# Patient Record
Sex: Female | Born: 1955 | Race: White | Hispanic: No | Marital: Single | Smoking: Never smoker
Health system: Southern US, Community
[De-identification: ages and names within clinical notes are randomized; demographics above are authoritative.]

## PROBLEM LIST (undated history)

## (undated) ENCOUNTER — Inpatient Hospital Stay: Admission: EM | Payer: Self-pay | Source: Home / Self Care

## (undated) DIAGNOSIS — M7070 Other bursitis of hip, unspecified hip: Secondary | ICD-10-CM

## (undated) DIAGNOSIS — R600 Localized edema: Secondary | ICD-10-CM

## (undated) DIAGNOSIS — D219 Benign neoplasm of connective and other soft tissue, unspecified: Secondary | ICD-10-CM

## (undated) DIAGNOSIS — E559 Vitamin D deficiency, unspecified: Secondary | ICD-10-CM

## (undated) DIAGNOSIS — E669 Obesity, unspecified: Secondary | ICD-10-CM

## (undated) DIAGNOSIS — F419 Anxiety disorder, unspecified: Secondary | ICD-10-CM

## (undated) DIAGNOSIS — M1990 Unspecified osteoarthritis, unspecified site: Secondary | ICD-10-CM

## (undated) DIAGNOSIS — K5790 Diverticulosis of intestine, part unspecified, without perforation or abscess without bleeding: Secondary | ICD-10-CM

## (undated) DIAGNOSIS — R609 Edema, unspecified: Secondary | ICD-10-CM

## (undated) DIAGNOSIS — G43909 Migraine, unspecified, not intractable, without status migrainosus: Secondary | ICD-10-CM

## (undated) DIAGNOSIS — K644 Residual hemorrhoidal skin tags: Secondary | ICD-10-CM

## (undated) DIAGNOSIS — S82899A Other fracture of unspecified lower leg, initial encounter for closed fracture: Secondary | ICD-10-CM

## (undated) DIAGNOSIS — R7303 Prediabetes: Secondary | ICD-10-CM

## (undated) DIAGNOSIS — M797 Fibromyalgia: Secondary | ICD-10-CM

## (undated) DIAGNOSIS — T7840XA Allergy, unspecified, initial encounter: Secondary | ICD-10-CM

## (undated) DIAGNOSIS — K219 Gastro-esophageal reflux disease without esophagitis: Secondary | ICD-10-CM

## (undated) HISTORY — DX: Prediabetes: R73.03

## (undated) HISTORY — PX: EYE SURGERY: SHX253

## (undated) HISTORY — DX: Diverticulosis of intestine, part unspecified, without perforation or abscess without bleeding: K57.90

## (undated) HISTORY — DX: Allergy, unspecified, initial encounter: T78.40XA

## (undated) HISTORY — PX: KNEE SURGERY: SHX244

## (undated) HISTORY — DX: Other fracture of unspecified lower leg, initial encounter for closed fracture: S82.899A

## (undated) HISTORY — DX: Vitamin D deficiency, unspecified: E55.9

## (undated) HISTORY — DX: Localized edema: R60.0

## (undated) HISTORY — PX: MANDIBLE SURGERY: SHX707

## (undated) HISTORY — DX: Other bursitis of hip, unspecified hip: M70.70

## (undated) HISTORY — DX: Benign neoplasm of connective and other soft tissue, unspecified: D21.9

## (undated) HISTORY — PX: BLEPHAROPLASTY: SUR158

## (undated) HISTORY — DX: Gastro-esophageal reflux disease without esophagitis: K21.9

## (undated) HISTORY — DX: Residual hemorrhoidal skin tags: K64.4

## (undated) HISTORY — DX: Edema, unspecified: R60.9

## (undated) HISTORY — PX: LIPOMA EXCISION: SHX5283

## (undated) HISTORY — DX: Anxiety disorder, unspecified: F41.9

## (undated) HISTORY — DX: Obesity, unspecified: E66.9

## (undated) HISTORY — DX: Unspecified osteoarthritis, unspecified site: M19.90

## (undated) HISTORY — PX: TONSILLECTOMY: SUR1361

## (undated) HISTORY — DX: Fibromyalgia: M79.7

## (undated) HISTORY — PX: MOUTH SURGERY: SHX715

---

## 1997-08-18 ENCOUNTER — Other Ambulatory Visit: Admission: RE | Admit: 1997-08-18 | Discharge: 1997-08-18 | Payer: Self-pay | Admitting: Obstetrics and Gynecology

## 1997-08-22 ENCOUNTER — Emergency Department (HOSPITAL_COMMUNITY): Admission: EM | Admit: 1997-08-22 | Discharge: 1997-08-22 | Payer: Self-pay | Admitting: Cardiology

## 1997-08-23 ENCOUNTER — Ambulatory Visit (HOSPITAL_COMMUNITY): Admission: RE | Admit: 1997-08-23 | Discharge: 1997-08-23 | Payer: Self-pay | Admitting: Internal Medicine

## 1998-09-09 ENCOUNTER — Other Ambulatory Visit: Admission: RE | Admit: 1998-09-09 | Discharge: 1998-09-09 | Payer: Self-pay | Admitting: Obstetrics and Gynecology

## 1999-11-09 ENCOUNTER — Other Ambulatory Visit: Admission: RE | Admit: 1999-11-09 | Discharge: 1999-11-09 | Payer: Self-pay | Admitting: Obstetrics and Gynecology

## 1999-11-30 ENCOUNTER — Encounter: Payer: Self-pay | Admitting: Obstetrics and Gynecology

## 1999-11-30 ENCOUNTER — Ambulatory Visit (HOSPITAL_COMMUNITY): Admission: RE | Admit: 1999-11-30 | Discharge: 1999-11-30 | Payer: Self-pay | Admitting: Obstetrics and Gynecology

## 2000-01-05 ENCOUNTER — Encounter: Admission: RE | Admit: 2000-01-05 | Discharge: 2000-01-05 | Payer: Self-pay | Admitting: Gastroenterology

## 2000-01-05 ENCOUNTER — Encounter: Payer: Self-pay | Admitting: Gastroenterology

## 2000-04-30 HISTORY — PX: ABDOMINAL HYSTERECTOMY: SHX81

## 2000-07-19 ENCOUNTER — Encounter: Admission: RE | Admit: 2000-07-19 | Discharge: 2000-07-19 | Payer: Self-pay | Admitting: Gastroenterology

## 2000-07-19 ENCOUNTER — Encounter: Payer: Self-pay | Admitting: Gastroenterology

## 2000-10-07 ENCOUNTER — Encounter: Payer: Self-pay | Admitting: Obstetrics and Gynecology

## 2000-10-14 ENCOUNTER — Encounter (INDEPENDENT_AMBULATORY_CARE_PROVIDER_SITE_OTHER): Payer: Self-pay

## 2000-10-14 ENCOUNTER — Inpatient Hospital Stay (HOSPITAL_COMMUNITY): Admission: RE | Admit: 2000-10-14 | Discharge: 2000-10-16 | Payer: Self-pay | Admitting: Obstetrics and Gynecology

## 2001-02-06 ENCOUNTER — Ambulatory Visit (HOSPITAL_COMMUNITY): Admission: RE | Admit: 2001-02-06 | Discharge: 2001-02-06 | Payer: Self-pay | Admitting: Vascular Surgery

## 2001-06-25 ENCOUNTER — Other Ambulatory Visit: Admission: RE | Admit: 2001-06-25 | Discharge: 2001-06-25 | Payer: Self-pay | Admitting: Obstetrics and Gynecology

## 2001-07-07 ENCOUNTER — Encounter: Admission: RE | Admit: 2001-07-07 | Discharge: 2001-07-07 | Payer: Self-pay | Admitting: Orthopaedic Surgery

## 2001-07-07 ENCOUNTER — Encounter: Payer: Self-pay | Admitting: Orthopaedic Surgery

## 2002-06-30 ENCOUNTER — Other Ambulatory Visit: Admission: RE | Admit: 2002-06-30 | Discharge: 2002-06-30 | Payer: Self-pay | Admitting: Obstetrics and Gynecology

## 2002-12-12 ENCOUNTER — Encounter: Payer: Self-pay | Admitting: Orthopaedic Surgery

## 2002-12-12 ENCOUNTER — Encounter: Admission: RE | Admit: 2002-12-12 | Discharge: 2002-12-12 | Payer: Self-pay | Admitting: Orthopaedic Surgery

## 2003-06-04 ENCOUNTER — Other Ambulatory Visit: Admission: RE | Admit: 2003-06-04 | Discharge: 2003-06-04 | Payer: Self-pay | Admitting: Obstetrics and Gynecology

## 2003-11-08 ENCOUNTER — Encounter: Admission: RE | Admit: 2003-11-08 | Discharge: 2003-11-08 | Payer: Self-pay | Admitting: Orthopaedic Surgery

## 2003-11-21 ENCOUNTER — Encounter: Admission: RE | Admit: 2003-11-21 | Discharge: 2003-11-21 | Payer: Self-pay | Admitting: Orthopaedic Surgery

## 2004-03-22 ENCOUNTER — Encounter: Admission: RE | Admit: 2004-03-22 | Discharge: 2004-03-22 | Payer: Self-pay | Admitting: Orthopedic Surgery

## 2004-06-15 ENCOUNTER — Other Ambulatory Visit: Admission: RE | Admit: 2004-06-15 | Discharge: 2004-06-15 | Payer: Self-pay | Admitting: Obstetrics and Gynecology

## 2004-07-07 ENCOUNTER — Ambulatory Visit: Payer: Self-pay | Admitting: Internal Medicine

## 2004-07-20 ENCOUNTER — Ambulatory Visit: Payer: Self-pay | Admitting: Family Medicine

## 2004-07-25 ENCOUNTER — Ambulatory Visit: Payer: Self-pay | Admitting: Internal Medicine

## 2004-07-26 ENCOUNTER — Ambulatory Visit: Payer: Self-pay | Admitting: Internal Medicine

## 2004-08-03 ENCOUNTER — Ambulatory Visit: Payer: Self-pay | Admitting: Internal Medicine

## 2004-08-25 ENCOUNTER — Emergency Department (HOSPITAL_COMMUNITY): Admission: EM | Admit: 2004-08-25 | Discharge: 2004-08-25 | Payer: Self-pay | Admitting: Emergency Medicine

## 2004-08-28 ENCOUNTER — Ambulatory Visit: Payer: Self-pay | Admitting: Internal Medicine

## 2004-09-20 ENCOUNTER — Ambulatory Visit: Payer: Self-pay | Admitting: Internal Medicine

## 2004-10-25 ENCOUNTER — Ambulatory Visit: Payer: Self-pay | Admitting: Internal Medicine

## 2005-04-20 ENCOUNTER — Encounter: Admission: RE | Admit: 2005-04-20 | Discharge: 2005-04-20 | Payer: Self-pay | Admitting: Orthopedic Surgery

## 2005-05-04 ENCOUNTER — Encounter: Admission: RE | Admit: 2005-05-04 | Discharge: 2005-05-04 | Payer: Self-pay | Admitting: Orthopedic Surgery

## 2005-05-23 ENCOUNTER — Encounter: Admission: RE | Admit: 2005-05-23 | Discharge: 2005-05-23 | Payer: Self-pay | Admitting: Orthopedic Surgery

## 2005-05-31 ENCOUNTER — Encounter: Admission: RE | Admit: 2005-05-31 | Discharge: 2005-05-31 | Payer: Self-pay | Admitting: Orthopedic Surgery

## 2005-08-22 ENCOUNTER — Other Ambulatory Visit: Admission: RE | Admit: 2005-08-22 | Discharge: 2005-08-22 | Payer: Self-pay | Admitting: Obstetrics and Gynecology

## 2005-12-25 ENCOUNTER — Ambulatory Visit: Payer: Self-pay | Admitting: Sports Medicine

## 2006-01-01 ENCOUNTER — Ambulatory Visit: Payer: Self-pay | Admitting: Sports Medicine

## 2006-02-12 ENCOUNTER — Ambulatory Visit: Payer: Self-pay | Admitting: Sports Medicine

## 2006-02-14 ENCOUNTER — Encounter: Admission: RE | Admit: 2006-02-14 | Discharge: 2006-02-14 | Payer: Self-pay | Admitting: Orthopedic Surgery

## 2006-03-26 ENCOUNTER — Ambulatory Visit: Payer: Self-pay | Admitting: Sports Medicine

## 2006-08-22 ENCOUNTER — Ambulatory Visit: Payer: Self-pay | Admitting: Internal Medicine

## 2006-09-02 ENCOUNTER — Ambulatory Visit: Payer: Self-pay | Admitting: Internal Medicine

## 2006-09-02 DIAGNOSIS — K5730 Diverticulosis of large intestine without perforation or abscess without bleeding: Secondary | ICD-10-CM | POA: Insufficient documentation

## 2006-09-02 HISTORY — PX: COLONOSCOPY: SHX5424

## 2006-09-26 ENCOUNTER — Other Ambulatory Visit: Admission: RE | Admit: 2006-09-26 | Discharge: 2006-09-26 | Payer: Self-pay | Admitting: Obstetrics and Gynecology

## 2007-09-29 ENCOUNTER — Other Ambulatory Visit: Admission: RE | Admit: 2007-09-29 | Discharge: 2007-09-29 | Payer: Self-pay | Admitting: Obstetrics and Gynecology

## 2008-02-25 ENCOUNTER — Ambulatory Visit: Payer: Self-pay | Admitting: Obstetrics and Gynecology

## 2008-03-29 ENCOUNTER — Telehealth: Payer: Self-pay | Admitting: Internal Medicine

## 2008-05-20 DIAGNOSIS — J45990 Exercise induced bronchospasm: Secondary | ICD-10-CM | POA: Insufficient documentation

## 2008-05-20 DIAGNOSIS — J309 Allergic rhinitis, unspecified: Secondary | ICD-10-CM | POA: Insufficient documentation

## 2008-05-25 ENCOUNTER — Encounter: Admission: RE | Admit: 2008-05-25 | Discharge: 2008-05-25 | Payer: Self-pay | Admitting: Orthopedic Surgery

## 2008-05-26 ENCOUNTER — Ambulatory Visit: Payer: Self-pay | Admitting: Internal Medicine

## 2008-05-26 DIAGNOSIS — K219 Gastro-esophageal reflux disease without esophagitis: Secondary | ICD-10-CM | POA: Insufficient documentation

## 2008-07-23 ENCOUNTER — Encounter: Admission: RE | Admit: 2008-07-23 | Discharge: 2008-07-23 | Payer: Self-pay | Admitting: Orthopedic Surgery

## 2008-10-21 ENCOUNTER — Other Ambulatory Visit: Admission: RE | Admit: 2008-10-21 | Discharge: 2008-10-21 | Payer: Self-pay | Admitting: Obstetrics and Gynecology

## 2008-10-21 ENCOUNTER — Encounter: Payer: Self-pay | Admitting: Obstetrics and Gynecology

## 2008-10-21 ENCOUNTER — Ambulatory Visit: Payer: Self-pay | Admitting: Obstetrics and Gynecology

## 2009-01-03 ENCOUNTER — Emergency Department (HOSPITAL_COMMUNITY): Admission: EM | Admit: 2009-01-03 | Discharge: 2009-01-03 | Payer: Self-pay | Admitting: Emergency Medicine

## 2009-06-10 ENCOUNTER — Ambulatory Visit: Payer: Self-pay | Admitting: Internal Medicine

## 2009-07-14 ENCOUNTER — Encounter: Admission: RE | Admit: 2009-07-14 | Discharge: 2009-07-14 | Payer: Self-pay | Admitting: Orthopedic Surgery

## 2009-08-01 ENCOUNTER — Encounter: Admission: RE | Admit: 2009-08-01 | Discharge: 2009-08-01 | Payer: Self-pay | Admitting: Orthopedic Surgery

## 2009-11-17 ENCOUNTER — Telehealth: Payer: Self-pay | Admitting: Family Medicine

## 2009-12-07 ENCOUNTER — Ambulatory Visit: Payer: Self-pay | Admitting: Obstetrics and Gynecology

## 2009-12-07 ENCOUNTER — Other Ambulatory Visit: Admission: RE | Admit: 2009-12-07 | Discharge: 2009-12-07 | Payer: Self-pay | Admitting: Obstetrics and Gynecology

## 2010-05-20 ENCOUNTER — Encounter: Payer: Self-pay | Admitting: Orthopedic Surgery

## 2010-05-30 NOTE — Assessment & Plan Note (Signed)
Summary: POSSBLE HEMORRHOIDS/FH   History of Present Illness Visit Type: new patient Primary GI MD: Stan Head MD [facility] Primary Provider: Oneta Rack Chief Complaint: Possible hemorrhoid History of Present Illness:   55 yo white woman s/p colonoscopy 2008. She called [place] about perianal irritation a few months ago and tried Monistat. Started out with irritation, soreness and itching. The Monistat helped but still has some problems.  Also has occasional smear of stool in underwear but no other bowel problems. Rare rectal bleeding with bright red on toilet paper.  Intermittent antacid use and nocturnal heartburn/reflux. A few to several times a month without dysphagia or other asociated problems. Does eat late at night and goes to bed after, frequently.  All other GI ROS negative.           Colonoscopy  Procedure date:  09/02/2006  Findings:      Results: Hemorrhoids. Small external/tag    Results: Diverticulosis.         Comments:      Repeat colonoscopy in 10 years.    Procedures Next Due Date:    Colonoscopy: 08/2016     Prior Medications Reviewed Using: Patient Recall  Updated Prior Medication List: MULTIVITAMINS  TABS (MULTIPLE VITAMIN) once daily FLEXERIL 10 MG TABS (CYCLOBENZAPRINE HCL) as needed IBUPROFEN 200 MG TABS (IBUPROFEN) as needed MOBIC 7.5 MG TABS (MELOXICAM) as needed  Current Allergies: No known allergies   Past Medical History:    Reviewed history from 05/20/2008 and no changes required:       Asthma, exercise-induced       Allergic Rhinitis  Past Surgical History:    Reviewed history from 05/20/2008 and no changes required:       Arthroscopic knee (L)  1994       Tonsillectomy 1964       Fatty tumor removed from labia-1979       Lower Jaw surgery 1990       Hysterectomy   Family History:    Family History of Diabetes:Father     Family History of Heart Disease: Father  Social History:    Occupation:Regulatory     Patient has  never smoked.     Alcohol Use - no    Daily Caffeine Use -3    Illicit Drug Use - no   Risk Factors:  Tobacco use:  never Drug use:  no Alcohol use:  no  Colonoscopy History:     Date of Last Colonoscopy:  09/02/2006    Results:  Results: Hemorrhoids. Small external/tag    Results: Diverticulosis.           Review of Systems       The patient complains of allergy/sinus, arthritis/joint pain, back pain, itching, and swelling of feet/legs.         All other ROS negative except as per HPI.    Vital Signs:  Patient Profile:   55 Years Old Female Height:     65 inches Weight:      195.25 pounds BMI:     32.61 Pulse rate:   60 / minute Pulse rhythm:   regular BP sitting:   120 / 80  (left arm)  Vitals Entered By: June McMurray CMA (May 26, 2008 11:04 AM)                  Physical Exam  General:     obese.   Eyes:     anicteric Abdomen:     obese, soft, NT, BS +,  no HSM Rectal:     female staff present, small perianal tag, ? slightly reduced resting tone, brown stool no mass perianal skin shows hypopigmentation with erythema and skin breaksown in gluteal crease Neurologic:     Alert and  oriented x4;      Impression & Recommendations:  Problem # 1:  PERIANAL DERMATOMYCOSIS (ICD-111.9) Assessment: New Likely due to sweat or inadequate drying after bathing. Local measures discussed and treat as outlined below.  Problem # 2:  EXTERNAL HEMORRHOIDS (ICD-455.3) Assessment: Comment Only Account for rare rectal bleeding. Colonoscopy 2008 otherwise ok. As per patient instructions.  Problem # 3:  GERD (ICD-530.81) Assessment: New Very mild, to try lifestyle changes.   Patient Instructions: 1)  Appy nystatin-triamcinolone ointment as prescribed. 2)  Use a hair dryer on low to dry anal area, use wet wipes after bowel movements as needed. 3)  Use a daily fiber supplement like metamucil or Benefiber, can also try FiberCon tabs. You choose. 4)  Rona Ravens  is a powder you can use to keep dry and prevent rash again after ointment used (available OTC). 5)  Read GERD handout. 6)  Please schedule a follow-up appointment as needed. 7)  Copy Sent To: Lucky Cowboy, MD and Edyth Gunnels, MD    Prescriptions: NYSTATIN-TRIAMCINOLONE 100000-0.1 UNIT/GM-% OINT (NYSTATIN-TRIAMCINOLONE) apply to affected area 2-3 times a day  #30 x 1   Entered by:   Francee Piccolo CMA   Authorized by:   Iva Boop MD   Signed by:   Francee Piccolo CMA on 05/26/2008   Method used:   [facility]ron[facility] to        [phone]* (retail)       [address] [place], [place]  [address]       Ph: [phone] or [phone]       Fax: [phone]   RxID:   [id number]

## 2010-05-30 NOTE — Procedures (Signed)
Summary: Colonoscopy   Colonoscopy  Procedure date:  09/02/2006  Findings:      Results: Hemorrhoids.     Results: Diverticulosis.       Location:  [facility].    Procedures Next Due Date:    Colonoscopy: 08/2016  Patient Name: Bridget Joyce, Bridget Joyce. MRN:  Procedure Procedures: Colonoscopy CPT: [phone].  Personnel: Endoscopist: Iva Boop, MD, [facility].  Exam Location: Exam performed in Outpatient Clinic. Outpatient  Patient Consent: Procedure, Alternatives, Risks and Benefits discussed, consent obtained, from patient. Consent was obtained by the RN.  Indications  Average Risk Screening Routine.  History  Current Medications: Patient is not currently taking Coumadin.  Allergies: No known allergies.  Pre-Exam Physical: Performed Sep 02, 2006. Cardio-pulmonary exam, Rectal exam, HEENT exam , Abdominal exam, Mental status exam WNL.  Comments: Pt. history reviewed/updated, physical exam performed prior to initiation of sedation? YES Exam Exam: Extent of exam reached: Cecum, extent intended: Cecum.  The cecum was identified by appendiceal orifice and IC valve. Patient position: on left side. Time to Cecum: 00:24:30. Time for Withdrawl: 00:06:40. Colon retroflexion performed. Images taken. ASA Classification: II. Tolerance: excellent.  Monitoring: Pulse and BP monitoring, Oximetry used. Supplemental O2 given.  Colon Prep Used MiraLax for colon prep. Prep results: excellent.  Sedation Meds: Patient assessed and found to be appropriate for moderate (conscious) sedation. Fentanyl 100 mcg. given IV. Versed 10 mg. given IV.  Findings - DIVERTICULOSIS: Descending Colon to Sigmoid Colon. ICD9: Diverticulosis, Colon: 562.10. Comments: moderate.  - NORMAL EXAM: Cecum to Splenic Flexure.  HEMORRHOIDS: External. Size: Small. ICD9: Hemorrhoids, External: 455. 3.   Assessment  Diagnoses: 562.10: Diverticulosis, Colon.  455.3: Hemorrhoids, External.    Comments: 1) NO POLYPS OR CANCER SEEN 2) LEFT-SIDED DIVERTICULOSIS 3) SMALL EXTERNAL HEMORRHOIDS AND ANAL TAG 4) EXCELLENT PREP Events  Unplanned Interventions: No intervention was required.  Plans Patient Education: Patient given standard instructions for: Diverticulosis. Hemorrhoids. Patient instructed to get routine colonoscopy every 10 years.  Disposition: After procedure patient sent to recovery. After recovery patient sent home.  Scheduling/Referral: Follow-Up prn.   CC:   Lucky Cowboy, MD   Edyth Gunnels, MD  This report was created from the original endoscopy report, which was reviewed and signed by the above listed endoscopist.

## 2010-05-30 NOTE — Assessment & Plan Note (Signed)
Summary: F/U--FEELS A KNOT IN ABD--CH.   History of Present Illness Visit Type: Follow-up Visit Primary GI MD: Stan Head MD [facility] Primary Provider: Lucky Cowboy, MD  Requesting Provider: n/a Chief Complaint: Knot in stomach  History of Present Illness:   55 yo woman with increased borborygmi in past month. One day she felt somethng itching and palpated rght side and ? if there was mre firmnes there  no other GI symptoms           Current Medications (verified): 1)  Multivitamins  Tabs (Multiple Vitamin) .... Once Daily 2)  Flexeril 10 Mg Tabs (Cyclobenzaprine Hcl) .... As Needed 3)  Ibuprofen 200 Mg Tabs (Ibuprofen) .... As Needed 4)  Mobic 7.5 Mg Tabs (Meloxicam) .... As Needed 5)  Nystatin-Triamcinolone 100000-0.1 Unit/gm-% Oint (Nystatin-Triamcinolone) .... Apply To Affected Area 2-3 Times A Day  Allergies (verified): No Known Drug Allergies  Past History:  Past Medical History: Reviewed history from 05/26/2008 and no changes required. Asthma, exercise-induced Allergic Rhinitis  Past Surgical History: Reviewed history from 05/26/2008 and no changes required. Arthroscopic knee (L)  1994 Tonsillectomy 1964 Fatty tumor removed from labia-1979 Lower Jaw surgery 1990 Hysterectomy  Family History: Family History of Diabetes:Father  Family History of Heart Disease: Father No FH of Colon Cancer:  Social History: Reviewed history from 05/26/2008 and no changes required. Occupation:Regulatory  Patient has never smoked.  Alcohol Use - no Daily Caffeine Use -3 Illicit Drug Use - no  Vital Signs:  Patient profile:   55 year old female Height:      65 inches Weight:      210 pounds BMI:     35.07 BSA:     2.02 Pulse rate:   64 / minute Pulse rhythm:   regular BP sitting:   120 / 72  (left arm) Cuff size:   regular  Vitals Entered By: Ok Anis CMA (June 10, 2009 3:52 PM)  Physical Exam  General:  obese.   Eyes:  anicteric Abdomen:  obese,  soft mildly tender in RUQ which increases with muscle tension no abnormality but likel some assymetry with abdominal fat greater on left than right no hernia   Impression & Recommendations:  Problem # 1:  ABDOMINAL TENDERNESS RIGHT UPPER QUADRANT (ICD-789.61) Assessment New I think she has abdominal wall/muscular pain should improve with time I found no subcutaneous lesions and I think what she palpated was due to assymetry of abdominal wall adipose tisue she will ovbserve for any changes and follow-up as needed  Problem # 2:  OBESITY (ICD-278.00) Assessment: New long discussion about this and need to reduce calories low fat diet given though not only way - she can pursue other diets (low catrb) dietitian referral offered but notaccepted she is 85# > than IBW  Patient Instructions: 1)  The abdominal wall strain should resolve over time.  If you notice a change or have a persistent problem then call our office. 2)  Obesity and Low Fat Diet handout given.  3)  Your BMI is 35.07.  We can make a referral to a Nutritionist if you like. 4)  The medication list was reviewed and reconciled.  All changed / newly prescribed medications were explained.  A complete medication list was provided to the patient / caregiver. cc Lucky Cowboy, MD

## 2010-05-30 NOTE — Progress Notes (Signed)
Summary: TRIAGE  Phone Note Call from Patient Call back at [phone]   Caller: Patient Call For: Steen Bisig Reason for Call: Talk to Nurse Summary of Call: PT STATES THAT SHE HAS BEEN HAVING A LOT OF RECTAL PROBLEMS , STATES THAT HER BOTTOM IS VERY RED AND SHE HAS A LOT OF ITCHING WANTS TO KNOW WHAT TO DO Initial call taken by: Tawni Levy,  March 29, 2008 9:32 AM  Follow-up for Phone Call        patient c/o red rash around her rectum and tailbone.  Very itchy.  She treated herself for a vaginal yeast infection but still has rash and itching.  Discussed rectal care, sitz bath two times a day, advised to get some Monistat creama and apply to rectal area and rash two-three times a day.  If no improvment then need to call us back.  Follow-up by: Darcey Nora RN,  March 29, 2008 9:45 AM  Additional Follow-up for Phone Call Additional follow up Details #1::        Her PCP should be able to handle but I am ok seeing her in future, if needed Additional Follow-up by: Iva Boop MD,  March 29, 2008 11:09 AM

## 2010-05-30 NOTE — Progress Notes (Signed)
Summary: Pt is req to re-est with Dr. Scotty Court or have a med called in  Phone Note Call from Patient Call back at [phone]   Caller: Patient Summary of Call: Pt called and said she was a pt of yours. Pts last ov was in 2006. Pt called to req ov asap or to get a med called in for severe headaches. Pt would like to re-establish with you.    [facility] Initial call taken by: Lucy Antigua,  November 17, 2009 3:55 PM  Follow-up for Phone Call        Dr Scotty Court is not taking new pts  but, maybe can get established with another PCP Follow-up by: Pura Spice, RN,  November 23, 2009 4:08 PM  Additional Follow-up for Phone Call Additional follow up Details #1::        Phone Call Completed Additional Follow-up by: Rudy Jew, RN,  November 23, 2009 4:11 PM

## 2010-09-15 NOTE — Discharge Summary (Signed)
[facility]  Patient:    Bridget Joyce, Bridget Joyce                  MRN: [id number] Adm. Date:  [id number] Disc. Date: [id number] Attending:  Ethelyne Mt                           Discharge Summary  HISTORY OF PRESENT ILLNESS:  The patient is a 56 year old nulligravida who was admitted to the hospital with symptomatic fibroids for surgery.  HOSPITAL COURSE:  On the day of admission, she was taken to the operating room and a total abdominal hysterectomy was performed.  Postoperatively, she had some nausea which responded to a change in her pain medicine and continuing her IVs.  By the second postoperative day, she was voiding well, passing gas, and her nausea was gone.  DISCHARGE MEDICATIONS:  She was discharged on Darvocet-N 100 for pain relief.  DIET:  Regular.  ACTIVITY:  Ambulatory.  FOLLOW-UP:  She is to return to the office in two days for staple removal.  LABORATORY DATA:  The final pathology report revealed fibroids.  The pathology report is actually not in the chart at the time of dictation, but by memory, she had fibroids on the pathology report.  CONDITION ON DISCHARGE:  Improved.  DISCHARGE DIAGNOSIS:  Symptomatic fibroids.  OPERATIONS:  Total abdominal hysterectomy. DD:  10/23/00 TD:  10/23/00 Job: [id number]

## 2010-09-15 NOTE — Op Note (Signed)
[facility]  Patient:    Bridget Joyce, Bridget Joyce                       MRN: [id number] Proc. Date: 10/14/00 Attending:  Rande Brunt. Eda Paschal, M.D.                           Operative Report  PREOPERATIVE DIAGNOSIS:       Symptomatic fibroids.  POSTOPERATIVE DIAGNOSIS:      Symptomatic fibroids.  OPERATION:                    Total abdominal hysterectomy.  SURGEON:                      Daniel L. Eda Paschal, M.D.  FIRST ASSISTANT:              Katy Fitch, M.D.  ANESTHESIA:                   General endotracheal.  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  Ovaries, fallopian tubes, pelvic peritoneum were free of disease. Rest of the exploration of the abdomen was normal.  DESCRIPTION OF PROCEDURE:     After adequate general endotracheal anesthesia, the patient was placed in supine position and prepped and draped in the usual sterile manner.  Foley catheter was inserted in the patients bladder. Pfannenstiel incision was made, the fascia was opened transversely, the peritoneum was entered vertically.  Subcutaneous bleeders were clamped and bovied as encountered.  When the peritoneal cavity was opened, the above findings were noted.  The round ligaments were clamped, cut and suture-ligated and the vesicouterine fold of peritoneum and posterior peritoneum were sharply incised.  Uteroovarian ligaments and fallopian tubes were clamped, cut and doubly suture-ligated with #1 chromic catgut.  The bladder flap was advanced. The uterine arteries were clamped, cut and doubly suture-ligated with #1 chromic catgut.  A supracervical hysterectomy was done so that we could get deeper into the pelvis with ease.  After this was done, the cervix was removed by clamping, cutting and suture-ligating the parametrium with #1 chromic catgut.  Cervicovaginal  junction was identified and with sharp dissection, the vaginal cuff was sutured with a #1 chromic catgut, incorporating uterosacrals for parametrium for good vault support.  The cervix as well as the uterus were sent to pathology for tissue diagnosis.  The cuff was closed with figure-of-eights of 0 Vicryl.  Copious irrigation was done with sterile saline.  Peritoneum was closed with 0 Vicryl in a running suture. Subcutaneous and subfascial tissue was copiously irrigated.  The fascia was closed with two running 0 Vicryls.  The skin was closed with staples. Estimated blood loss for the entire procedure was 250 cc with none replaced. Patient tolerated the procedure well and left the operating room in satisfactory condition, draining clear urine from her Foley catheter. DD:  10/14/00 TD:  10/14/00 Job: [id number]

## 2010-11-21 ENCOUNTER — Other Ambulatory Visit: Payer: Self-pay | Admitting: Neurological Surgery

## 2010-11-21 DIAGNOSIS — M25551 Pain in right hip: Secondary | ICD-10-CM

## 2010-11-21 DIAGNOSIS — M545 Low back pain, unspecified: Secondary | ICD-10-CM

## 2010-11-27 ENCOUNTER — Ambulatory Visit
Admission: RE | Admit: 2010-11-27 | Discharge: 2010-11-27 | Disposition: A | Discharge status: Home / Self Care | Payer: BC Managed Care – PPO | Source: Ambulatory Visit | Attending: Neurological Surgery | Admitting: Neurological Surgery

## 2010-11-27 DIAGNOSIS — M25551 Pain in right hip: Secondary | ICD-10-CM

## 2010-11-27 DIAGNOSIS — M545 Low back pain, unspecified: Secondary | ICD-10-CM

## 2010-11-27 DIAGNOSIS — M25552 Pain in left hip: Secondary | ICD-10-CM

## 2010-12-04 ENCOUNTER — Encounter: Payer: Self-pay | Admitting: Gynecology

## 2010-12-12 ENCOUNTER — Other Ambulatory Visit (HOSPITAL_COMMUNITY)
Admission: RE | Admit: 2010-12-12 | Discharge: 2010-12-12 | Disposition: A | Discharge status: Home / Self Care | Payer: BC Managed Care – PPO | Source: Ambulatory Visit | Attending: Obstetrics and Gynecology | Admitting: Obstetrics and Gynecology

## 2010-12-12 ENCOUNTER — Ambulatory Visit (INDEPENDENT_AMBULATORY_CARE_PROVIDER_SITE_OTHER): Payer: BC Managed Care – PPO | Admitting: Obstetrics and Gynecology

## 2010-12-12 ENCOUNTER — Encounter: Payer: Self-pay | Admitting: Obstetrics and Gynecology

## 2010-12-12 VITALS — BP 120/78 | Ht 65.0 in | Wt 205.0 lb

## 2010-12-12 DIAGNOSIS — Z780 Asymptomatic menopausal state: Secondary | ICD-10-CM

## 2010-12-12 DIAGNOSIS — Z01419 Encounter for gynecological examination (general) (routine) without abnormal findings: Secondary | ICD-10-CM

## 2010-12-12 DIAGNOSIS — R82998 Other abnormal findings in urine: Secondary | ICD-10-CM

## 2010-12-12 DIAGNOSIS — N951 Menopausal and female climacteric states: Secondary | ICD-10-CM

## 2010-12-12 LAB — HM PAP SMEAR: HM Pap smear: NORMAL

## 2010-12-12 MED ORDER — ESTRADIOL 0.05 MG/24HR TD PTTW: 1.0000 | MEDICATED_PATCH | TRANSDERMAL | Status: DC

## 2010-12-12 NOTE — Progress Notes (Signed)
Patient came to see me today for her annual GYN exam. She has a feeling of warmth all thetime and wonders if she should go on estrogen. She is up-to-date on mammograms but is due for bone density.  HEENT: Within normal limits. Neck: No masses. Supraclavicular lymph nodes: Not enlarged. Breasts: Examined in both sitting and lying position. Symmetrical without skin changes or masses. Abdomen: Soft no masses guarding or rebound. No hernias. Pelvic: External within normal limits. BUS within normal limits. Vaginal examination shows good estrogen effect, no cystocele enterocele or rectocele. Cervix and uterus absent. Adnexa within normal limits. Rectovaginal confirmatory. Extremities within normal limits.   Assessment: Menopausal symptoms  Plan: Had a very long discussion about pros and cons of estrogen especially since she doesn't have a uterus. Started her on Vivelle-Dot patches 0.05 mg BIW. Bone density ordered.

## 2010-12-12 NOTE — Progress Notes (Signed)
Addended byCammie Mcgee T on: 12/12/2010 10:27 AM   Modules accepted: Orders

## 2010-12-21 ENCOUNTER — Ambulatory Visit (INDEPENDENT_AMBULATORY_CARE_PROVIDER_SITE_OTHER): Payer: BC Managed Care – PPO | Admitting: Gynecology

## 2010-12-21 DIAGNOSIS — Z13820 Encounter for screening for osteoporosis: Secondary | ICD-10-CM

## 2010-12-21 DIAGNOSIS — Z780 Asymptomatic menopausal state: Secondary | ICD-10-CM

## 2010-12-21 LAB — HM DEXA SCAN: HM Dexa Scan: NORMAL

## 2011-03-28 ENCOUNTER — Ambulatory Visit (INDEPENDENT_AMBULATORY_CARE_PROVIDER_SITE_OTHER): Payer: BC Managed Care – PPO | Admitting: Internal Medicine

## 2011-03-28 ENCOUNTER — Encounter: Payer: Self-pay | Admitting: Internal Medicine

## 2011-03-28 ENCOUNTER — Other Ambulatory Visit (INDEPENDENT_AMBULATORY_CARE_PROVIDER_SITE_OTHER): Payer: BC Managed Care – PPO

## 2011-03-28 VITALS — BP 128/76 | HR 64 | Ht 65.0 in | Wt 211.0 lb

## 2011-03-28 DIAGNOSIS — R1032 Left lower quadrant pain: Secondary | ICD-10-CM

## 2011-03-28 DIAGNOSIS — R10814 Left lower quadrant abdominal tenderness: Secondary | ICD-10-CM

## 2011-03-28 LAB — CBC WITH DIFFERENTIAL/PLATELET
Basophils Absolute: 0 10*3/uL (ref 0.0–0.1)
Basophils Relative: 0.8 % (ref 0.0–3.0)
Eosinophils Absolute: 0.1 10*3/uL (ref 0.0–0.7)
Eosinophils Relative: 1.8 % (ref 0.0–5.0)
HCT: 37.1 % (ref 36.0–46.0)
Hemoglobin: 12.4 g/dL (ref 12.0–15.0)
Lymphocytes Relative: 25.4 % (ref 12.0–46.0)
Lymphs Abs: 1.6 10*3/uL (ref 0.7–4.0)
MCHC: 33.3 g/dL (ref 30.0–36.0)
MCV: 93.7 fl (ref 78.0–100.0)
Monocytes Absolute: 0.3 10*3/uL (ref 0.1–1.0)
Monocytes Relative: 5.5 % (ref 3.0–12.0)
Neutro Abs: 4.2 10*3/uL (ref 1.4–7.7)
Neutrophils Relative %: 66.5 % (ref 43.0–77.0)
Platelets: 356 10*3/uL (ref 150.0–400.0)
RBC: 3.96 Mil/uL (ref 3.87–5.11)
RDW: 13.6 % (ref 11.5–14.6)
WBC: 6.4 10*3/uL (ref 4.5–10.5)

## 2011-03-28 LAB — BASIC METABOLIC PANEL
BUN: 14 mg/dL (ref 6–23)
CO2: 29 mEq/L (ref 19–32)
Calcium: 9.2 mg/dL (ref 8.4–10.5)
Chloride: 104 mEq/L (ref 96–112)
Creatinine, Ser: 0.8 mg/dL (ref 0.4–1.2)
GFR: 82.8 mL/min (ref >60.00)
Glucose, Bld: 92 mg/dL (ref 70–99)
Potassium: 4 mEq/L (ref 3.5–5.1)
Sodium: 141 mEq/L (ref 135–145)

## 2011-03-28 NOTE — Progress Notes (Signed)
Subjective:    Patient ID: Bridget Joyce, female    DOB: 08-08-1955, 55 y.o.   MRN: [id number]  HPI This 55 year old woman presents with a three-week history of intermittent sharp and sometimes severe left lower quadrant pain. She's also had some change in bowel habits with smaller stools. Has not been any fever, nausea or vomiting. She denies any genitourinary symptoms. She does have chronic buttock and lower extremity pain for 2 years but that does not seem to be related to this problem. She has not had any rectal bleeding. She says the pain actually has gotten better but it does persist. It may be worse with bending over as well but not with movement described.  No Known Allergies Outpatient Prescriptions Prior to Visit  Medication Sig Dispense Refill  . albuterol (PROVENTIL) (2.5 MG/3ML) 0.083% nebulizer solution Take 2.5 mg by nebulization every 6 (six) hours as needed.        . BUMETANIDE PO Take by mouth as needed.       . Calcium Carbonate-Vitamin D (CALCIUM + D PO) Take by mouth 2 (two) times daily.        . magnesium 30 MG tablet Take by mouth.        . Multiple Vitamin (MULTIVITAMIN) capsule Take 1 capsule by mouth daily.        Marland Kitchen VITAMIN D, CHOLECALCIFEROL, PO Take 2,000 Units by mouth.       . CYCLOBENZAPRINE HCL PO Take by mouth as needed.        Marland Kitchen estradiol (VIVELLE) 0.05 MG/24HR Place 1 patch (0.05 mg total) onto the skin 2 (two) times a week.  8 patch  12  . MELOXICAM PO Take by mouth as needed.        . Montelukast Sodium (SINGULAIR PO) Take by mouth as needed.         Past Medical History  Diagnosis Date  . Fibroid   . Anxiety   . Diverticulosis   . External hemorrhoids   . GERD (gastroesophageal reflux disease)   . Asthma    Past Surgical History  Procedure Date  . Tonsillectomy   . Knee surgery     Left  . Lipoma excision     Labial  . Abdominal hysterectomy 2002  . Mandible surgery   . Mouth surgery   . Colonoscopy 09/02/2006    external  hemorrhoids,diverticulosis   History   Social History  . Marital Status: Single     N/A     N/A  .     Social History Main Topics  . Smoking status: Never Smoker   . Smokeless tobacco: Never Used  . Alcohol Use: No  . Drug Use: No  . Sexually Active: No     Family History  Problem Relation Age of Onset  . Hypertension Father   . Heart disease Father   . Diabetes Father   . Heart failure Father   . Uterine cancer Sister   . Diabetes Sister   . Colon cancer Neg Hx          Review of Systems As above    Objective:   Physical Exam General: obese, nad Eyes: anicteric Lungs: clear Heart: S1S2 no rubs, murmurs or gallops Abdomen: soft , BS+, obese. She is tender in the left lower quadrant with some mild involuntary guarding at times. Remainder the abdomen is soft and nontender without organomegaly or mass. Ext: no edema There is no CVA tenderness  Assessment & Plan:  A three-week history of left lower quadrant tenderness and pain. She does have diverticulosis as seen on prior colonoscopy in 2008. There's some associated bowel habit changes well. She probably has diverticulitis. We discussed the options of treating her for that with antibiotics versus diagnostic workup for CT scanning. We have decided to pursue CT of the abdomen and pelvis with IV contrast, a CBC and basic metabolic panel will be done as well. I will call the results and plans to her.

## 2011-03-28 NOTE — Patient Instructions (Addendum)
Please go to the basement upon leaving today to have your labs done.    You have been scheduled for a CT scan of the abdomen and pelvis at [facility] ([address]---this is in the same building as Architectural technologist).   You are scheduled on 03/30/11 at 11:30 am. You should arrive 15 minutes prior to your appointment time for registration. Please follow the written instructions below on the day of your exam:  WARNING: IF YOU ARE ALLERGIC TO IODINE/X-RAY DYE, PLEASE NOTIFY RADIOLOGY IMMEDIATELY AT [phone]! YOU WILL BE GIVEN A 13 HOUR PREMEDICATION PREP.  1) Do not eat or drink anything after 7:30 am (4 hours prior to your test) 2) You have been given 2 bottles of oral contrast to drink. The solution may taste better if refrigerated, but do NOT add ice or any other liquid to this solution. Shake well before drinking.    Drink 1 bottle of contrast @ 9:30 am (2 hours prior to your exam)  Drink 1 bottle of contrast @ 10:30 am (1 hour prior to your exam)  You may take any medications as prescribed with a small amount of water except for the following: Metformin, Glucophage, Glucovance, Avandamet, Riomet, Fortamet, Actoplus Met, Janumet, Glumetza or Metaglip. The above medications must be held the day of the exam AND 48 hours after the exam.  The purpose of you drinking the oral contrast is to aid in the visualization of your intestinal tract. The contrast solution may cause some diarrhea. Before your exam is started, you will be given a small amount of fluid to drink. Depending on your individual set of symptoms, you may also receive an intravenous injection of x-ray contrast/dye. Plan on being at [facility] for 30 minutes or long, depending on the type of exam you are having performed.  If you have any questions regarding your exam or if you need to reschedule, you may call the CT department at [phone] between the hours of 8:00 am and 5:00 pm,  Monday-Friday.  ________________________________________________________________________

## 2011-03-29 ENCOUNTER — Telehealth: Payer: Self-pay | Admitting: Gastroenterology

## 2011-03-29 NOTE — Telephone Encounter (Signed)
Received a call from [facility] at [facility] stating that patient called and cancelled her CT appointment for tomorrow 03/30/11.

## 2011-03-30 ENCOUNTER — Other Ambulatory Visit: Payer: BC Managed Care – PPO

## 2011-07-16 ENCOUNTER — Telehealth: Payer: Self-pay | Admitting: Internal Medicine

## 2011-07-16 NOTE — Telephone Encounter (Signed)
Left message for patient to call back  

## 2011-07-16 NOTE — Telephone Encounter (Signed)
Patient canceled the CT scan that was scheduled for her last year and wants to discuss the possibility of setting up a CT scan.  Patient is scheduled for an office visit on 08/10/11 to discuss

## 2011-08-10 ENCOUNTER — Encounter: Payer: Self-pay | Admitting: Internal Medicine

## 2011-08-10 ENCOUNTER — Ambulatory Visit (INDEPENDENT_AMBULATORY_CARE_PROVIDER_SITE_OTHER): Payer: BC Managed Care – PPO | Admitting: Internal Medicine

## 2011-08-10 VITALS — BP 124/80 | HR 74 | Ht 65.0 in | Wt 216.0 lb

## 2011-08-10 DIAGNOSIS — R198 Other specified symptoms and signs involving the digestive system and abdomen: Secondary | ICD-10-CM

## 2011-08-10 DIAGNOSIS — R1032 Left lower quadrant pain: Secondary | ICD-10-CM

## 2011-08-10 DIAGNOSIS — R194 Change in bowel habit: Secondary | ICD-10-CM

## 2011-08-10 MED ORDER — PEG-KCL-NACL-NASULF-NA ASC-C 100 G PO SOLR: 1.0000 | Freq: Once | ORAL | Status: DC

## 2011-08-10 NOTE — Progress Notes (Signed)
  Subjective:    Patient ID: Bridget Joyce, female    DOB: Jan 23, 1956, 56 y.o.   MRN: [id number]  HPI This middle aged white woman presents for followup. I had seen her in November, 2012 she had left lower quadrant pain and tenderness and change in bowel habits. There was concern for possible diverticulitis. She was constipated. She had a CT scan scheduled but decided not to do that due to radiation exposure. She had problems for several weeks and eventually spontaneously resolved. Then again in February she had a shorter episode per week or 2 with milder pain and mild or constipation and change in bowels. She relates that her sister has had a colonoscopy and then subsequent 6 inch resection of her colon the following day because of "something in the corner". She does not think she has had colon cancer but really does not know exactly what that diagnosis was, this took place in [place].  She actually feels okay today but remains concerned about these episodes of change in bowels and pain. Review his colonoscopy 2008 and showed diverticulosis and external hemorrhoids.  Medications, allergies, past medical history, past surgical history, family history and social history are reviewed and updated in the EMR.   Review of Systems As above    Objective:   Physical Exam General:  NAD - obese Eyes:   anicteric Lungs:  clear Heart:  S1S2 no rubs, murmurs or gallops Abdomen:  soft and nontender, BS+     Data Reviewed:   Lab Results  Component Value Date   WBC 6.4 03/28/2011   HGB 12.4 03/28/2011   HCT 37.1 03/28/2011   MCV 93.7 03/28/2011   PLT 356.0 03/28/2011     Chemistry      Component Value Date/Time   NA 141 03/28/2011 1437   K 4.0 03/28/2011 1437   CL 104 03/28/2011 1437   CO2 29 03/28/2011 1437   BUN 14 03/28/2011 1437   CREATININE 0.8 03/28/2011 1437      Component Value Date/Time   CALCIUM 9.2 03/28/2011 1437             Assessment & Plan:   1. Change in  bowel habits   2. LLQ pain    She's had at least 2 episodes, one fairly prolonged period she decided against a CT scan due to radiation exposure. She could be having recurrent diverticulitis. Colorectal neoplasia remains in the differential diagnosis though less likely. We discussed things, and I decided to proceed with a diagnostic colonoscopy.The risks and benefits as well as alternatives of endoscopic procedure(s) have been discussed and reviewed. All questions answered. The patient agrees to proceed.   She will also ask her sister what diagnosis she has has a could have barium upon the frequency of routine colonoscopy.  Patient also describes rare episodes of nocturnal reflux, it is usually when she eats late at night. I advised dietary modification and she can use an antacid or Pepcid complete on an as-needed basis when she eats close to bedtime.  Cc: Nadean Corwin, MD

## 2011-08-10 NOTE — Patient Instructions (Signed)
You have been scheduled for a colonoscopy. Please follow written instructions given to you at your visit today.  Please pick up your prep kit at the pharmacy within the next 1-3 days.  

## 2011-09-18 ENCOUNTER — Telehealth: Payer: Self-pay | Admitting: Internal Medicine

## 2011-09-18 NOTE — Telephone Encounter (Signed)
No charge. 

## 2011-09-20 ENCOUNTER — Other Ambulatory Visit: Payer: BC Managed Care – PPO | Admitting: Internal Medicine

## 2011-12-07 ENCOUNTER — Ambulatory Visit (AMBULATORY_SURGERY_CENTER): Payer: BC Managed Care – PPO | Admitting: *Deleted

## 2011-12-07 VITALS — Ht 65.0 in | Wt 214.0 lb

## 2011-12-07 DIAGNOSIS — Z1211 Encounter for screening for malignant neoplasm of colon: Secondary | ICD-10-CM

## 2011-12-07 MED ORDER — MOVIPREP 100 G PO SOLR: ORAL | Status: DC

## 2011-12-10 ENCOUNTER — Encounter: Payer: Self-pay | Admitting: Internal Medicine

## 2011-12-20 ENCOUNTER — Encounter: Payer: BC Managed Care – PPO | Admitting: Internal Medicine

## 2011-12-27 ENCOUNTER — Encounter: Payer: BC Managed Care – PPO | Admitting: Obstetrics and Gynecology

## 2012-01-03 ENCOUNTER — Ambulatory Visit (INDEPENDENT_AMBULATORY_CARE_PROVIDER_SITE_OTHER): Payer: BC Managed Care – PPO | Admitting: Obstetrics and Gynecology

## 2012-01-03 ENCOUNTER — Encounter: Payer: Self-pay | Admitting: Obstetrics and Gynecology

## 2012-01-03 ENCOUNTER — Telehealth: Payer: Self-pay | Admitting: *Deleted

## 2012-01-03 VITALS — BP 122/78 | Ht 65.0 in | Wt 214.0 lb

## 2012-01-03 DIAGNOSIS — R102 Pelvic and perineal pain: Secondary | ICD-10-CM

## 2012-01-03 DIAGNOSIS — Z01419 Encounter for gynecological examination (general) (routine) without abnormal findings: Secondary | ICD-10-CM

## 2012-01-03 MED ORDER — ESTRADIOL 0.025 MG/24HR TD PTTW: 1.0000 | MEDICATED_PATCH | TRANSDERMAL | Status: DC

## 2012-01-03 NOTE — Telephone Encounter (Signed)
Pt was seen today for annual and forgot to mention some wrist line discomfort off & on since Jan. She was not having discomfort today, but said that it feels sore at time on the left side mainly in abdominal area. Pt would like to know if an ultrasound would be appropriate? Please advise

## 2012-01-03 NOTE — Addendum Note (Signed)
Addended by: Aura Camps on: 01/03/2012 03:34 PM   Modules accepted: Orders

## 2012-01-03 NOTE — Telephone Encounter (Signed)
ORDER PLACED

## 2012-01-03 NOTE — Telephone Encounter (Signed)
Yes waist, order will be placed for this.

## 2012-01-03 NOTE — Telephone Encounter (Signed)
Did you mean waist? Assuming U. Did ultrasound is appropriate.

## 2012-01-03 NOTE — Progress Notes (Signed)
The patient came to see me today for her annual GYN exam. She continues to have hot flashes but they are getting better. She has thought about estrogen replacement for a long time and we've discussed in the past. Her major motivation was like a focus and improvement in  skin quality rather the hot flashes. She is not vaginally dry. She is not sexually active. We discussed skin products and places she can go for them previously. See last year's office visit. She is having no vaginal bleeding. She is having no pelvic pain. She is due for her yearly mammogram. She has had 2 normal bone densities. The last one was 2012. She is status post TAH done in 2002 for large fibroids. She has always had normal Pap smears. Her last Pap smear was 2012. She is having no urinary issues.  HEENT: Within normal limits. Kennon Portela present. Neck: No masses. Supraclavicular lymph nodes: Not enlarged. Breasts: Examined in both sitting and lying position. Symmetrical without skin changes or masses. Abdomen: Soft no masses guarding or rebound. No hernias. Pelvic: External within normal limits. BUS within normal limits. Vaginal examination shows good estrogen effect, no cystocele enterocele or rectocele. Cervix and uterus absent. Adnexa within normal limits. Rectovaginal confirmatory. Extremities within normal limits.  Assessment: Menopausal symptoms including lack of focus.  Plan: Schedule yearly mammogram. We had a long discussion about HRT. Prescription for Vivelle dot patch 0.025 mg twice a week written. She will decide.The new Pap smear guidelines were discussed with the patient. No Pap done.

## 2012-01-03 NOTE — Patient Instructions (Signed)
Schedule yearly mammogram

## 2012-01-04 LAB — URINALYSIS W MICROSCOPIC + REFLEX CULTURE
Bacteria, UA: NONE SEEN
Bilirubin Urine: NEGATIVE
Casts: NONE SEEN
Crystals: NONE SEEN
Glucose, UA: NEGATIVE mg/dL
Hgb urine dipstick: NEGATIVE
Ketones, ur: NEGATIVE mg/dL
Leukocytes, UA: NEGATIVE
Nitrite: NEGATIVE
Protein, ur: NEGATIVE mg/dL
Specific Gravity, Urine: 1.023 (ref 1.005–1.030)
Squamous Epithelial / LPF: NONE SEEN
Urobilinogen, UA: 0.2 mg/dL (ref 0.0–1.0)
pH: 7 (ref 5.0–8.0)

## 2012-01-07 ENCOUNTER — Encounter (HOSPITAL_COMMUNITY): Payer: Self-pay

## 2012-01-07 ENCOUNTER — Telehealth: Payer: Self-pay | Admitting: Internal Medicine

## 2012-01-07 ENCOUNTER — Emergency Department (HOSPITAL_COMMUNITY)
Admission: EM | Admit: 2012-01-07 | Discharge: 2012-01-08 | Disposition: A | Discharge status: Home / Self Care | Payer: BC Managed Care – PPO | Attending: Emergency Medicine | Admitting: Emergency Medicine

## 2012-01-07 DIAGNOSIS — K219 Gastro-esophageal reflux disease without esophagitis: Secondary | ICD-10-CM | POA: Insufficient documentation

## 2012-01-07 DIAGNOSIS — M722 Plantar fascial fibromatosis: Secondary | ICD-10-CM

## 2012-01-07 DIAGNOSIS — M898X9 Other specified disorders of bone, unspecified site: Secondary | ICD-10-CM | POA: Insufficient documentation

## 2012-01-07 DIAGNOSIS — J45909 Unspecified asthma, uncomplicated: Secondary | ICD-10-CM | POA: Insufficient documentation

## 2012-01-07 DIAGNOSIS — E669 Obesity, unspecified: Secondary | ICD-10-CM | POA: Insufficient documentation

## 2012-01-07 DIAGNOSIS — M779 Enthesopathy, unspecified: Secondary | ICD-10-CM

## 2012-01-07 NOTE — ED Notes (Signed)
Pt states she was walking at home this evening and developed a pain in her left heel-states she was barefoot when it happened and states she does not know if she stepped on something-states an area of redness on her heel-denies any injury.

## 2012-01-07 NOTE — Telephone Encounter (Signed)
Spoke with Dr. Leone Payor, who states Bridget Joyce should prep.    Spoke with Bridget Joyce and instructed her to follow instructions and understanding voiced.

## 2012-01-07 NOTE — Telephone Encounter (Signed)
Pt called back.  She states she ate [employer] for lunch.  She hasn't mixed her prep yet.  I told her to stay on clear liquids until I heard from Dr. Leone Payor and understanding voiced.-

## 2012-01-08 ENCOUNTER — Emergency Department (HOSPITAL_COMMUNITY): Payer: BC Managed Care – PPO

## 2012-01-08 ENCOUNTER — Encounter: Payer: Self-pay | Admitting: Internal Medicine

## 2012-01-08 ENCOUNTER — Ambulatory Visit (AMBULATORY_SURGERY_CENTER): Payer: BC Managed Care – PPO | Admitting: Internal Medicine

## 2012-01-08 VITALS — BP 116/76 | HR 72 | Temp 97.4°F | Resp 29 | Ht 65.0 in | Wt 214.0 lb

## 2012-01-08 DIAGNOSIS — Z1211 Encounter for screening for malignant neoplasm of colon: Secondary | ICD-10-CM

## 2012-01-08 DIAGNOSIS — D126 Benign neoplasm of colon, unspecified: Secondary | ICD-10-CM

## 2012-01-08 DIAGNOSIS — K5730 Diverticulosis of large intestine without perforation or abscess without bleeding: Secondary | ICD-10-CM

## 2012-01-08 LAB — HM COLONOSCOPY

## 2012-01-08 MED ORDER — SODIUM CHLORIDE 0.9 % IV SOLN: 500.0000 mL | INTRAVENOUS | Status: DC

## 2012-01-08 NOTE — ED Notes (Signed)
Pt reported walking out barefoot 1930 yesterday to get stuff out of the car and had left foot pain side of the bottom of the heel. Lateral bottom side of left heel reddened.

## 2012-01-08 NOTE — Op Note (Signed)
[facility] [address] [place], [address]   COLONOSCOPY PROCEDURE REPORT  PATIENT: Bridget Joyce, Bridget Joyce  MR#: [id number] BIRTHDATE: 06-27-1955 , 56  yrs. old GENDER: Female ENDOSCOPIST: Iva Boop, MD, [facility] REFERRED [id number] Oneta Rack, M.D. PROCEDURE DATE:  01/08/2012 PROCEDURE:   Colonoscopy with snare polypectomy ASA CLASS:   Class II INDICATIONS:average risk screening. MEDICATIONS: Propofol (Diprivan) 240 mg IV, MAC sedation, administered by CRNA, and These medications were titrated to patient response per physician's verbal order  DESCRIPTION OF PROCEDURE:   After the risks benefits and alternatives of the procedure were thoroughly explained, informed consent was obtained.  A digital rectal exam revealed no abnormalities of the rectum.   The LB CF-[id number]  endoscope was introduced through the anus and advanced to the cecum, which was identified by both the appendix and ileocecal valve. No adverse events experienced.   The quality of the prep was excellent, using MoviPrep  The instrument was then slowly withdrawn as the colon was fully examined.      COLON FINDINGS: A smooth flat polyp was found at the cecum.  A polypectomy was performed with a cold snare.  The resection was complete and the polyp tissue was completely retrieved.   There was severe diverticulosis noted in the sigmoid colon with associated muscular hypertrophy and colonic narrowing.   The colon mucosa was otherwise normal.  Retroflexed views revealed no abnormalities. The time to cecum=3 minutes 21 seconds.  Withdrawal time=10 minutes 39 seconds.  The scope was withdrawn and the procedure completed. COMPLICATIONS: There were no complications.  ENDOSCOPIC IMPRESSION: 1.   Flat polyp was found at the cecum; polypectomy was performed with a cold snare 2.   There was severe diverticulosis noted in the sigmoid colon 3.   The colon mucosa was otherwise  normal  RECOMMENDATIONS: Timing of repeat colonoscopy will be determined by pathology findings.   eSigned:  Iva Boop, MD, [facility] 01/08/2012 3:00 PM  cc: Lucky Cowboy, MD and The Patient

## 2012-01-08 NOTE — ED Notes (Signed)
Pt reports left lat foot pain that began this evening - pt unsure if it is a spider bite or foreign body, pain was acute onset - mild redness noted to left lat heal, no induration noted or gross foreign body.

## 2012-01-08 NOTE — ED Notes (Signed)
Pt very upset w/ wait time in department, apologized multiple times to pt and attempted to explain to pt why other pts may have been taken back before her - pt continues to be upset and stating she no longer wishes to wait and has colonoscopy scheduled for 9am. Tiffany, PA made aware of pt and unable to see pt at this time. Pt left w/o being seen by provider. In no acute distress - A&Ox4 - ambulating independently w/o assistance.

## 2012-01-08 NOTE — ED Provider Notes (Signed)
History     CSN: [id number]  Arrival date & time 01/07/12  2108   First MD Initiated Contact with Patient 01/08/12 0133      Chief Complaint  Patient presents with  . Foot Pain     HPI Pt states she was walking at home this evening and developed a pain in her left heel-states she was barefoot when it happened and states she does not know if she stepped on something-states an area of redness on her heel-denies any injury  Past Medical History  Diagnosis Date  . Fibroid   . Anxiety   . Diverticulosis   . External hemorrhoids   . GERD (gastroesophageal reflux disease)   . Asthma   . Obesity   . Peripheral edema   . Allergy     rag weed, pollen,dust  . Bursitis of hip     Past Surgical History  Procedure Date  . Tonsillectomy   . Knee surgery     Left  . Lipoma excision     Labial  . Abdominal hysterectomy 2002  . Mandible surgery   . Mouth surgery   . Colonoscopy 09/02/2006    external hemorrhoids,diverticulosis    Family History  Problem Relation Age of Onset  . Hypertension Father   . Heart disease Father   . Diabetes Father   . Heart failure Father   . Uterine cancer Sister   . Diabetes Sister   . Colon cancer Neg Hx   . Esophageal cancer Neg Hx   . Stomach cancer Neg Hx   . Rectal cancer Neg Hx     History  Substance Use Topics  . Smoking status: Never Smoker   . Smokeless tobacco: Never Used  . Alcohol Use: No    OB History    Grav Para Term Preterm Abortions TAB SAB Ect Mult Living   0               Review of Systems  All other systems reviewed and are negative.    Allergies  Review of patient's allergies indicates no known allergies.  Home Medications   Current Outpatient Rx  Name Route Sig Dispense Refill  . ALBUTEROL SULFATE (2.5 MG/3ML) 0.083% IN NEBU Nebulization Take 2.5 mg by nebulization every 6 (six) hours as needed.      . ASPIRIN EC 81 MG PO TBEC Oral Take 81 mg by mouth daily.    . BUMETANIDE PO Oral Take 1 tablet by  mouth daily as needed. For fluid.    . BUSPIRONE HCL 10 MG PO TABS Oral Take 10 mg by mouth daily.     Marland Kitchen CALCIUM CITRATE + PO Oral Take 1 tablet by mouth daily.     Marland Kitchen VITAMIN D 2000 UNITS PO CAPS Oral Take 1 capsule by mouth daily.    Marland Kitchen CITALOPRAM HYDROBROMIDE 40 MG PO TABS Oral Take 40 mg by mouth daily.     Marland Kitchen MAGNESIUM 30 MG PO TABS Oral Take 30 mg by mouth daily.     . ADULT MULTIVITAMIN W/MINERALS CH Oral Take 1 tablet by mouth daily.    Marland Kitchen ESTRADIOL 0.025 MG/24HR TD PTTW Transdermal Place 1 patch onto the skin 2 (two) times a week. 8 patch 12    BP 120/71  Pulse 74  Temp 98 F (36.7 C) (Oral)  Resp 20  Ht 5\' 5"  (1.651 m)  Wt 207 lb (93.895 kg)  BMI 34.45 kg/m2  SpO2 96%  Physical Exam  Nursing note  and vitals reviewed. Constitutional: She is oriented to person, place, and time. She appears well-developed. No distress.  HENT:  Head: Normocephalic and atraumatic.  Eyes: Pupils are equal, round, and reactive to light.  Neck: Normal range of motion.  Cardiovascular: Normal rate and intact distal pulses.   Pulmonary/Chest: No respiratory distress.  Abdominal: Normal appearance. She exhibits no distension.  Musculoskeletal: Normal range of motion.       Feet:  Neurological: She is alert and oriented to person, place, and time. No cranial nerve deficit.  Skin: Skin is warm and dry. No rash noted.  Psychiatric: She has a normal mood and affect. Her behavior is normal.    ED Course  Procedures (including critical care time)  Labs Reviewed - No data to display Dg Foot Complete Left  01/08/2012  *RADIOLOGY REPORT*  Clinical Data: Sudden onset lateral left heel pain.  Recently walking bare foot.  LEFT FOOT - COMPLETE 3+ VIEW  Comparison: None.  Findings: Moderate inferior and posterior calcaneal spur formation. No fracture, dislocation or radiopaque foreign body.  Mild dorsal navicular spur formation and mild to moderate lateral spur formation on both sides of the first MTP joint.   IMPRESSION:  1.  Degenerative changes, as described above, including calcaneal spurs. 2.  No acute abnormality.   Original Report Authenticated By: Darrol Angel, M.D.      1. Bone spur   2. Plantar fasciitis       MDM         Nelia Shi, MD 01/08/12 1550

## 2012-01-08 NOTE — Patient Instructions (Addendum)
There was one possible polyp removed. I will let you know the pathology results.  You also have diverticulosis - I suspect that is what caused your abdominal pain.  Please read the handouts provided.  Thank you for choosing me and [facility].  Iva Boop, MD, FACG  YOU HAD AN ENDOSCOPIC PROCEDURE TODAY AT THE [facility]: Refer to the procedure report that was given to you for any specific questions about what was found during the examination.  If the procedure report does not answer your questions, please call your gastroenterologist to clarify.  If you requested that your care partner not be given the details of your procedure findings, then the procedure report has been included in a sealed envelope for you to review at your convenience later.  YOU SHOULD EXPECT: Some feelings of bloating in the abdomen. Passage of more gas than usual.  Walking can help get rid of the air that was put into your GI tract during the procedure and reduce the bloating. If you had a lower endoscopy (such as a colonoscopy or flexible sigmoidoscopy) you may notice spotting of blood in your stool or on the toilet paper. If you underwent a bowel prep for your procedure, then you may not have a normal bowel movement for a few days.  DIET: Your first meal following the procedure should be a light meal and then it is ok to progress to your normal diet.  A half-sandwich or bowl of soup is an example of a good first meal.  Heavy or fried foods are harder to digest and may make you feel nauseous or bloated.  Likewise meals heavy in dairy and vegetables can cause extra gas to form and this can also increase the bloating.  Drink plenty of fluids but you should avoid alcoholic beverages for 24 hours.  ACTIVITY: Your care partner should take you home directly after the procedure.  You should plan to take it easy, moving slowly for the rest of the day.  You can resume normal activity the day after the  procedure however you should NOT DRIVE or use heavy machinery for 24 hours (because of the sedation medicines used during the test).    SYMPTOMS TO REPORT IMMEDIATELY: A gastroenterologist can be reached at any hour.  During normal business hours, 8:30 AM to 5:00 PM Monday through Friday, call [phone].  After hours and on weekends, please call the GI answering service at [phone] who will take a message and have the physician on call contact you.   Following lower endoscopy (colonoscopy or flexible sigmoidoscopy):  Excessive amounts of blood in the stool  Significant tenderness or worsening of abdominal pains  Swelling of the abdomen that is new, acute  Fever of 100F or higher  Following upper endoscopy (EGD)  Vomiting of blood or coffee ground material  New chest pain or pain under the shoulder blades  Painful or persistently difficult swallowing  New shortness of breath  Fever of 100F or higher  Black, tarry-looking stools  FOLLOW UP: If any biopsies were taken you will be contacted by phone or by letter within the next 1-3 weeks.  Call your gastroenterologist if you have not heard about the biopsies in 3 weeks.  Our staff will call the home number listed on your records the next business day following your procedure to check on you and address any questions or concerns that you may have at that time regarding the information given to you following  your procedure. This is a courtesy call and so if there is no answer at the home number and we have not heard from you through the emergency physician on call, we will assume that you have returned to your regular daily activities without incident.  SIGNATURES/CONFIDENTIALITY: You and/or your care partner have signed paperwork which will be entered into your electronic medical record.  These signatures attest to the fact that that the information above on your After Visit Summary has been reviewed and is understood.  Full  responsibility of the confidentiality of this discharge information lies with you and/or your care-partner.    Polyp, diverticulosis, high fiber diet information given.

## 2012-01-08 NOTE — Progress Notes (Signed)
Patient did not experience any of the following events: a burn prior to discharge; a fall within the facility; wrong site/side/patient/procedure/implant event; or a hospital transfer or hospital admission upon discharge from the facility. (G8907) Patient did not have preoperative order for IV antibiotic SSI prophylaxis. (G8918)  

## 2012-01-08 NOTE — Progress Notes (Signed)
The pt tolerated the colonoscopy very well. Maw   

## 2012-01-09 ENCOUNTER — Telehealth: Payer: Self-pay | Admitting: *Deleted

## 2012-01-09 NOTE — Telephone Encounter (Signed)
Left message on number given in admitting yesterday. ewm 

## 2012-01-11 ENCOUNTER — Encounter: Payer: BC Managed Care – PPO | Admitting: Internal Medicine

## 2012-01-11 ENCOUNTER — Encounter: Payer: Self-pay | Admitting: Obstetrics and Gynecology

## 2012-01-14 ENCOUNTER — Ambulatory Visit (INDEPENDENT_AMBULATORY_CARE_PROVIDER_SITE_OTHER): Payer: BC Managed Care – PPO

## 2012-01-14 ENCOUNTER — Encounter: Payer: Self-pay | Admitting: Obstetrics and Gynecology

## 2012-01-14 ENCOUNTER — Ambulatory Visit (INDEPENDENT_AMBULATORY_CARE_PROVIDER_SITE_OTHER): Payer: BC Managed Care – PPO | Admitting: Obstetrics and Gynecology

## 2012-01-14 DIAGNOSIS — R1012 Left upper quadrant pain: Secondary | ICD-10-CM

## 2012-01-14 DIAGNOSIS — N83339 Acquired atrophy of ovary and fallopian tube, unspecified side: Secondary | ICD-10-CM

## 2012-01-14 DIAGNOSIS — N949 Unspecified condition associated with female genital organs and menstrual cycle: Secondary | ICD-10-CM

## 2012-01-14 DIAGNOSIS — R102 Pelvic and perineal pain: Secondary | ICD-10-CM

## 2012-01-14 NOTE — Patient Instructions (Signed)
If pain recurs go to see Dr. Leone Payor for her upper abdominal ultrasound.

## 2012-01-14 NOTE — Progress Notes (Signed)
Patient came to see me today because of several episodes of left flank and left upper quadrant pain. She has seen her GI doctor who did a colonoscopy revealing diverticulosis. She was concerned that she might have an ovarian neoplasm. On ultrasound her uterus is surgically absent. Neither ovary can be seen but there were absolutely no pelvic masses. There is no free fluid seen. Scan of her left upper quadrant revealed a normal spleen and left kidney.  Assessment: Left upper quadrant pain  Plan: Patient reassured that she had no ovarian pathology. If pain recurs she will return to her GI doctor her upper abdomen ultrasound.

## 2012-01-15 ENCOUNTER — Encounter: Payer: Self-pay | Admitting: Internal Medicine

## 2012-01-15 NOTE — Progress Notes (Signed)
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

## 2012-06-19 ENCOUNTER — Other Ambulatory Visit: Payer: Self-pay | Admitting: Dermatology

## 2012-10-28 DIAGNOSIS — H5210 Myopia, unspecified eye: Secondary | ICD-10-CM | POA: Insufficient documentation

## 2013-01-08 ENCOUNTER — Encounter: Payer: BC Managed Care – PPO | Admitting: Gynecology

## 2013-01-15 ENCOUNTER — Ambulatory Visit
Admission: RE | Admit: 2013-01-15 | Discharge: 2013-01-15 | Disposition: A | Discharge status: Home / Self Care | Payer: BC Managed Care – PPO | Source: Ambulatory Visit | Attending: Unknown Physician Specialty | Admitting: Unknown Physician Specialty

## 2013-01-15 ENCOUNTER — Other Ambulatory Visit: Payer: Self-pay | Admitting: Unknown Physician Specialty

## 2013-01-15 DIAGNOSIS — R52 Pain, unspecified: Secondary | ICD-10-CM

## 2013-02-11 ENCOUNTER — Ambulatory Visit: Payer: BC Managed Care – PPO | Admitting: *Deleted

## 2013-02-18 ENCOUNTER — Ambulatory Visit: Payer: BC Managed Care – PPO | Admitting: *Deleted

## 2013-02-23 ENCOUNTER — Other Ambulatory Visit: Payer: Self-pay

## 2013-02-23 DIAGNOSIS — Z1231 Encounter for screening mammogram for malignant neoplasm of breast: Secondary | ICD-10-CM

## 2013-03-18 ENCOUNTER — Ambulatory Visit
Admission: RE | Admit: 2013-03-18 | Discharge: 2013-03-18 | Disposition: A | Discharge status: Home / Self Care | Payer: BC Managed Care – PPO | Source: Ambulatory Visit

## 2013-03-18 DIAGNOSIS — Z1231 Encounter for screening mammogram for malignant neoplasm of breast: Secondary | ICD-10-CM

## 2013-03-18 LAB — HM MAMMOGRAPHY: HM Mammogram: NORMAL

## 2013-04-22 ENCOUNTER — Other Ambulatory Visit: Payer: Self-pay | Admitting: Physician Assistant

## 2013-04-22 MED ORDER — CYCLOBENZAPRINE HCL 10 MG PO TABS: 10.0000 mg | ORAL_TABLET | Freq: Three times a day (TID) | ORAL | Status: AC | PRN

## 2013-05-13 ENCOUNTER — Ambulatory Visit: Payer: BC Managed Care – PPO | Admitting: *Deleted

## 2013-06-20 DIAGNOSIS — R0989 Other specified symptoms and signs involving the circulatory and respiratory systems: Secondary | ICD-10-CM | POA: Insufficient documentation

## 2013-06-20 DIAGNOSIS — E559 Vitamin D deficiency, unspecified: Secondary | ICD-10-CM | POA: Insufficient documentation

## 2013-06-20 DIAGNOSIS — M797 Fibromyalgia: Secondary | ICD-10-CM | POA: Insufficient documentation

## 2013-06-20 DIAGNOSIS — R7303 Prediabetes: Secondary | ICD-10-CM | POA: Insufficient documentation

## 2013-06-20 DIAGNOSIS — F419 Anxiety disorder, unspecified: Secondary | ICD-10-CM | POA: Insufficient documentation

## 2013-06-24 ENCOUNTER — Ambulatory Visit: Payer: Self-pay | Admitting: Physician Assistant

## 2013-07-14 ENCOUNTER — Encounter: Payer: Self-pay | Admitting: *Deleted

## 2013-07-15 ENCOUNTER — Ambulatory Visit (INDEPENDENT_AMBULATORY_CARE_PROVIDER_SITE_OTHER): Payer: Self-pay | Admitting: *Deleted

## 2013-07-15 DIAGNOSIS — I781 Nevus, non-neoplastic: Secondary | ICD-10-CM

## 2013-07-15 NOTE — Progress Notes (Signed)
X=.3% Sotradecol administered with a 27g butterfly.  Patient received a total of 5cc.  She has lots of tiny red capillaries and som small blue spiders. I treated as much as possible with one syringe. Tol well. She might want to do an IPL for all the red vessels. Will follow prn.  Photos: yes  Compression stockings applied: yes

## 2013-12-01 ENCOUNTER — Telehealth: Payer: Self-pay | Admitting: *Deleted

## 2013-12-01 NOTE — Telephone Encounter (Signed)
Patient called and states she only takes bumetanide prn.  Recently she has had more edema in legs.  Per Dr Melford Aase, take Bumex daily, cut out salt and do not take Ibuprophen and Aleve.  Patient will call and schedule appointment if edema continues.

## 2013-12-02 ENCOUNTER — Other Ambulatory Visit: Payer: Self-pay

## 2013-12-02 DIAGNOSIS — Z1231 Encounter for screening mammogram for malignant neoplasm of breast: Secondary | ICD-10-CM

## 2013-12-14 ENCOUNTER — Institutional Professional Consult (permissible substitution): Payer: BC Managed Care – PPO | Admitting: Internal Medicine

## 2013-12-16 ENCOUNTER — Institutional Professional Consult (permissible substitution): Payer: BC Managed Care – PPO | Admitting: Internal Medicine

## 2013-12-18 ENCOUNTER — Ambulatory Visit (INDEPENDENT_AMBULATORY_CARE_PROVIDER_SITE_OTHER): Payer: BC Managed Care – PPO | Admitting: Internal Medicine

## 2013-12-18 ENCOUNTER — Encounter: Payer: Self-pay | Admitting: Internal Medicine

## 2013-12-18 VITALS — BP 122/82 | HR 87 | Temp 98.9°F | Ht 65.0 in | Wt 228.0 lb

## 2013-12-18 DIAGNOSIS — J45909 Unspecified asthma, uncomplicated: Secondary | ICD-10-CM

## 2013-12-18 DIAGNOSIS — R0609 Other forms of dyspnea: Secondary | ICD-10-CM

## 2013-12-18 DIAGNOSIS — J4530 Mild persistent asthma, uncomplicated: Secondary | ICD-10-CM

## 2013-12-18 DIAGNOSIS — R0600 Dyspnea, unspecified: Secondary | ICD-10-CM

## 2013-12-18 DIAGNOSIS — R0989 Other specified symptoms and signs involving the circulatory and respiratory systems: Secondary | ICD-10-CM

## 2013-12-18 MED ORDER — MOMETASONE FURO-FORMOTEROL FUM 100-5 MCG/ACT IN AERO: INHALATION_SPRAY | RESPIRATORY_TRACT | Status: DC

## 2013-12-18 NOTE — Patient Instructions (Addendum)
If you are needing the albuterol more than twice a week I would recommend dulera 100 1-2 every 12 hours or symbicort 80 1-2 every 12 hours   Ok to try off singulair if you wish to see what difference if any this makes

## 2013-12-18 NOTE — Progress Notes (Addendum)
   Subjective:    Patient ID: Bridget Joyce, female    DOB: 19-Nov-1955  MRN: [id number]  HPI  16 yowf never smoker some issues with allergies eval by [place]/ [place] allergy using albterol before ex ever since her 25s and took shots until summer of 2014 self referred to pulmonary clinic 12/18/13 after took herself off qvar x sev m due to concerns of chronic steroid exposure while still on singulari with  new pattern of wheezing occuring just with nl activities and occ hs as of early July 2015 with increase use of saba.    12/18/2013 1st [facility] office visit/ Bridget Joyce  Chief Complaint  Patient presents with  . Pulmonary Consult    Self referral for wheezing. Pt c/o wheezing x 1 month. Pt c/o DOE. Pt denies cough.   was using saba a lot more last week, this week only before exertion, not at hs. Wheezing /sob worse in hot weather.   Convinced years ago by trial and error she does better with ex if uses saba first so never tries s saba  No obvious other patterns in day to day or daytime variabilty or assoc chronic cough or cp or chest tightness, subjective wheeze overt sinus or hb symptoms. No unusual exp hx or h/o childhood pna/ asthma or knowledge of premature birth.  Sleeping ok without nocturnal  or early am exacerbation  of respiratory  c/o's or need for noct saba. Also denies any obvious fluctuation of symptoms with weather or environmental changes or other aggravating or alleviating factors except as outlined above   Current Medications, Allergies, Complete Past Medical History, Past Surgical History, Family History, and Social History were reviewed in [employer] record.           Review of Systems  Constitutional: Negative for fever and unexpected weight change.  HENT: Negative for congestion, dental problem, ear pain, nosebleeds, postnasal drip, rhinorrhea, sinus pressure, sneezing, sore throat and trouble swallowing.   Eyes:  Negative for redness and itching.  Respiratory: Positive for shortness of breath and wheezing. Negative for cough and chest tightness.   Cardiovascular: Positive for leg swelling. Negative for palpitations.  Gastrointestinal: Negative for nausea and vomiting.  Genitourinary: Negative for dysuria.  Musculoskeletal: Negative for joint swelling.  Skin: Negative for rash.  Neurological: Negative for headaches.  Hematological: Does not bruise/bleed easily.  Psychiatric/Behavioral: Negative for dysphoric mood. The patient is not nervous/anxious.        Objective:   Physical Exam Anxious but quite pleasant amb wf nad  Wt Readings from Last 3 Encounters:  12/18/13 228 lb (103.42 kg)  01/08/12 214 lb (97.07 kg)  01/07/12 207 lb (93.895 kg)     HEENT: nl dentition, turbinates, and orophanx. Nl external ear canals without cough reflex   NECK :  without JVD/Nodes/TM/ nl carotid upstrokes bilaterally   LUNGS: no acc muscle use, clear to A and P bilaterally without cough on insp or exp maneuvers   CV:  RRR  no s3 or murmur or increase in P2, no edema   ABD:  soft and nontender with nl excursion in the supine position. No bruits or organomegaly, bowel sounds nl  MS:  warm without deformities, calf tenderness, cyanosis or clubbing  SKIN: warm and dry without lesions    NEURO:  alert, approp, no deficits         Assessment & Plan:

## 2013-12-19 DIAGNOSIS — J4530 Mild persistent asthma, uncomplicated: Secondary | ICD-10-CM | POA: Insufficient documentation

## 2013-12-19 NOTE — Assessment & Plan Note (Signed)
Spirometry wnl in effort indep portion but this does not r/o EIA

## 2013-12-19 NOTE — Assessment & Plan Note (Signed)
DDX of  difficult airways management all start with A and  include Adherence, Ace Inhibitors, Acid Reflux, Active Sinus Disease, Alpha 1 Antitripsin deficiency, Anxiety masquerading as Airways dz,  ABPA,  allergy(esp in young), Aspiration (esp in elderly), Adverse effects of DPI,  Active smokers, plus two Bs  = Bronchiectasis and Beta blocker use..and one C= CHF  Adherence is always the initial "prime suspect" and is a multilayered concern that requires a "trust but verify" approach in every patient - starting with knowing how to use medications, especially inhalers, correctly, keeping up with refills and understanding the fundamental difference between maintenance and prns vs those medications only taken for a very short course and then stopped and not refilled.  The proper method of use, as well as anticipated side effects, of a metered-dose inhaler are discussed and demonstrated to the patient. Improved effectiveness after extensive coaching during this visit to a level of approximately  75% from a baseline of < 25%  - not likely to be compliant again with chronic qvar, so best bet is dulera or symbicort low dose the way it's used in [place]  Allergy > not convinced better on shots or singulair so ok with me to leave both off     Each maintenance medication was reviewed in detail including most importantly the difference between maintenance and as needed and under what circumstances the prns are to be used.  Please see instructions for details which were reviewed in writing and the patient given a copy.

## 2013-12-30 ENCOUNTER — Other Ambulatory Visit: Payer: Self-pay | Admitting: Orthopedic Surgery

## 2013-12-30 DIAGNOSIS — M25571 Pain in right ankle and joints of right foot: Secondary | ICD-10-CM

## 2014-01-06 ENCOUNTER — Other Ambulatory Visit: Payer: BC Managed Care – PPO

## 2014-01-10 ENCOUNTER — Ambulatory Visit
Admission: RE | Admit: 2014-01-10 | Discharge: 2014-01-10 | Disposition: A | Discharge status: Home / Self Care | Payer: BC Managed Care – PPO | Source: Ambulatory Visit | Attending: Orthopedic Surgery | Admitting: Orthopedic Surgery

## 2014-01-10 DIAGNOSIS — M25571 Pain in right ankle and joints of right foot: Secondary | ICD-10-CM

## 2014-01-12 ENCOUNTER — Other Ambulatory Visit: Payer: BC Managed Care – PPO

## 2014-02-18 ENCOUNTER — Encounter: Payer: Self-pay | Admitting: Internal Medicine

## 2014-03-17 ENCOUNTER — Ambulatory Visit: Payer: BC Managed Care – PPO | Admitting: *Deleted

## 2014-03-19 ENCOUNTER — Ambulatory Visit: Payer: BC Managed Care – PPO

## 2014-03-23 ENCOUNTER — Inpatient Hospital Stay: Admission: RE | Admit: 2014-03-23 | Payer: BC Managed Care – PPO | Source: Ambulatory Visit

## 2014-03-31 ENCOUNTER — Ambulatory Visit
Admission: RE | Admit: 2014-03-31 | Discharge: 2014-03-31 | Disposition: A | Discharge status: Home / Self Care | Payer: BC Managed Care – PPO | Source: Ambulatory Visit

## 2014-03-31 DIAGNOSIS — Z1231 Encounter for screening mammogram for malignant neoplasm of breast: Secondary | ICD-10-CM

## 2014-04-05 HISTORY — PX: REPAIR ANKLE LIGAMENT: SUR1187

## 2014-05-20 ENCOUNTER — Encounter: Payer: Self-pay | Admitting: Internal Medicine

## 2014-07-05 ENCOUNTER — Other Ambulatory Visit: Payer: Self-pay | Admitting: Obstetrics and Gynecology

## 2014-07-06 LAB — CYTOLOGY - PAP

## 2014-07-13 ENCOUNTER — Ambulatory Visit (INDEPENDENT_AMBULATORY_CARE_PROVIDER_SITE_OTHER): Payer: BLUE CROSS/BLUE SHIELD | Admitting: Internal Medicine

## 2014-07-13 ENCOUNTER — Encounter: Payer: Self-pay | Admitting: Internal Medicine

## 2014-07-13 VITALS — BP 116/76 | HR 68 | Temp 97.7°F | Resp 16 | Ht 65.25 in | Wt 224.6 lb

## 2014-07-13 DIAGNOSIS — Z1212 Encounter for screening for malignant neoplasm of rectum: Secondary | ICD-10-CM

## 2014-07-13 DIAGNOSIS — K219 Gastro-esophageal reflux disease without esophagitis: Secondary | ICD-10-CM

## 2014-07-13 DIAGNOSIS — R7303 Prediabetes: Secondary | ICD-10-CM

## 2014-07-13 DIAGNOSIS — Z111 Encounter for screening for respiratory tuberculosis: Secondary | ICD-10-CM

## 2014-07-13 DIAGNOSIS — I10 Essential (primary) hypertension: Secondary | ICD-10-CM

## 2014-07-13 DIAGNOSIS — E782 Mixed hyperlipidemia: Secondary | ICD-10-CM

## 2014-07-13 DIAGNOSIS — Z79899 Other long term (current) drug therapy: Secondary | ICD-10-CM

## 2014-07-13 DIAGNOSIS — R0989 Other specified symptoms and signs involving the circulatory and respiratory systems: Secondary | ICD-10-CM

## 2014-07-13 DIAGNOSIS — R7309 Other abnormal glucose: Secondary | ICD-10-CM

## 2014-07-13 DIAGNOSIS — E559 Vitamin D deficiency, unspecified: Secondary | ICD-10-CM

## 2014-07-13 NOTE — Progress Notes (Signed)
Patient ID: Bridget Joyce, female   DOB: 08-29-1955, 59 y.o.   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  Patient has been followed for Labile HTN,  Prediabetes, Hyperlipidemia, and Vitamin D Deficiency.    HTN predates many years & patient is followed expectantly. Patient's BP has been controlled at home and patient denies any cardiac symptoms as chest pain, palpitations, shortness of breath, dizziness or ankle swelling. Today's BP: 116/76 mmHg. Patient cointinues to follow with Dr Clovis Pu every 1-2 months for her profession.    Patient's hyperlipidemia is controlled with diet. Patient denies myalgias or other medication SE's. Last lipids were at goal - Chol 191, Trig 82, HDL 80 & LDL 956 in Oct 2014.   Patient has Morbid Obesity  (BMI 37.11) and consequent   prediabetes predating since Jan 2011 with an A1c of 6.5%, but has had several A1c's of 6.1% since.  and patient denies reactive hypoglycemic symptoms, visual blurring, diabetic polys, or paresthesias. Last A1c was 6.1% in Oct 2014.    Finally, patient has history of Vitamin D Deficiency of 21 in 2008. and last Vitamin D was 53 in Oct 2014. She relates that she has been off of her Vit D x 2 months.    Medication Sig  . albuterol (PROVENTIL HFA;VENTOLIN HFA) 108 (90 BASE) MCG/ACT inhaler Inhale into the lungs as needed.  Marland Kitchen albuterol (PROVENTIL) (2.5 MG/3ML) 0.083% nebulizer solution Take 2.5 mg by nebulization every 6 (six) hours as needed.    Marland Kitchen aspirin EC 81 MG tablet Take 81 mg by mouth. Take every third day.  . BUMETANIDE PO Take 1 tablet by mouth daily as needed. For fluid.  . busPIRone (BUSPAR) 10 MG tablet Take 10 mg by mouth daily.   . Cholecalciferol (VITAMIN D) 2000 UNITS CAPS Take 1 capsule by mouth daily.  . magnesium 30 MG tablet Take 30 mg by mouth daily.   . Multiple Vitamin (MULTIVITAMIN WITH MINERALS) TABS Take 1 tablet by mouth daily.  . Calcium  Citrate-Vitamin D (CALCIUM CITRATE + PO) Take 1 tablet by mouth daily.   . citalopram (CELEXA) 40 MG tablet Take 40 mg by mouth daily.   Marland Kitchen estradiol (VIVELLE-DOT) 0.025 MG/24HR Place 1 patch onto the skin 2 (two) times a week.  . mometasone-formoterol (DULERA) 100-5 MCG/ACT AERO 1-2 every 12 hours if having any wheezing or short of breath  . montelukast (SINGULAIR) 10 MG tablet Take 1 tablet by mouth daily.   No Known Allergies   Past Medical History  Diagnosis Date  . Fibroid   . Diverticulosis   . External hemorrhoids   . GERD (gastroesophageal reflux disease)   . Obesity   . Peripheral edema   . Allergy     rag weed, pollen,dust  . Bursitis of hip   . Anxiety   . Asthma   . Prediabetes   . Vitamin D deficiency   . Labile hypertension   . Fibromyalgia    Health Maintenance  Topic Date Due  . HIV Screening  06/30/1971  . INFLUENZA VACCINE  11/28/2013  . MAMMOGRAM  03/31/2016  . PAP SMEAR  07/04/2017  . COLONOSCOPY  01/07/2022  . TETANUS/TDAP  02/18/2023   Immunization History  Administered Date(s) Administered  . Influenza Split 01/28/2013  . Td 01/14/2003  . Tdap 02/17/2013   Past Surgical History  Procedure Laterality Date  . Tonsillectomy    . Knee surgery  Left  . Lipoma excision      Labial  . Abdominal hysterectomy  2002  . Mandible surgery    . Mouth surgery    . Colonoscopy  09/02/2006    external hemorrhoids,diverticulosis   Family History  Problem Relation Age of Onset  . Hypertension Father   . Heart disease Father   . Diabetes Father   . Heart failure Father   . Uterine cancer Sister   . Diabetes Sister   . Colon cancer Neg Hx   . Esophageal cancer Neg Hx   . Stomach cancer Neg Hx   . Rectal cancer Neg Hx    History  Substance Use Topics  . Smoking status: Never Smoker   . Smokeless tobacco: Never Used  . Alcohol Use: No    ROS Constitutional: Denies fever, chills, weight loss/gain, headaches, insomnia, fatigue, night sweats,  and change in appetite. Eyes: Denies redness, blurred vision, diplopia, discharge, itchy, watery eyes.  ENT: Denies discharge, congestion, post nasal drip, epistaxis, sore throat, earache, hearing loss, dental pain, Tinnitus, Vertigo, Sinus pain, snoring.  Cardio: Denies chest pain, palpitations, irregular heartbeat, syncope, dyspnea, diaphoresis, orthopnea, PND, claudication, edema Respiratory: denies cough, dyspnea, DOE, pleurisy, hoarseness, laryngitis, wheezing.  Gastrointestinal: Denies dysphagia, heartburn, reflux, water brash, pain, cramps, nausea, vomiting, bloating, diarrhea, constipation, hematemesis, melena, hematochezia, jaundice, hemorrhoids Genitourinary: Denies dysuria, frequency, urgency, nocturia, hesitancy, discharge, hematuria, flank pain Breast: Breast lumps, nipple discharge, bleeding.  Musculoskeletal: Denies arthralgia, myalgia, stiffness, Jt. Swelling, pain, limp, and strain/sprain. Denies falls. Skin: Denies puritis, rash, hives, warts, acne, eczema, changing in skin lesion Neuro: No weakness, tremor, incoordination, spasms, paresthesia, pain Psychiatric: Denies confusion, memory loss, sensory loss. Denies Depression. Endocrine: Denies change in weight, skin, hair change, nocturia, and paresthesia, diabetic polys, visual blurring, hyper / hypo glycemic episodes.  Heme/Lymph: No excessive bleeding, bruising, enlarged lymph nodes.  Physical Exam  BP 116/76   Pulse 68  Temp 97.7 F Resp 16  Ht 5' 5.25"   Wt 224 lb 9.6 oz     BMI 37.11   General Appearance: Well nourished and in no apparent distress. Eyes: PERRLA, EOMs, conjunctiva no swelling or erythema, normal fundi and vessels. Sinuses: No frontal/maxillary tenderness ENT/Mouth: EACs patent / TMs  nl. Nares clear without erythema, swelling, mucoid exudates. Oral hygiene is good. No erythema, swelling, or exudate. Tongue normal, non-obstructing. Tonsils not swollen or erythematous. Hearing normal.  Neck: Supple,  thyroid normal. No bruits, nodes or JVD. Respiratory: Respiratory effort normal.  BS equal and clear bilateral without rales, rhonci, wheezing or stridor. Cardio: Heart sounds are normal with regular rate and rhythm and no murmurs, rubs or gallops. Peripheral pulses are normal and equal bilaterally without edema. No aortic or femoral bruits. Chest: symmetric with normal excursions and percussion. Breasts: Symmetric, without lumps, nipple discharge, retractions, or fibrocystic changes.  Abdomen: Flat, soft, with bowl sounds. Nontender, no guarding, rebound, hernias, masses, or organomegaly.  Lymphatics: Non tender without lymphadenopathy.  Musculoskeletal: Full ROM all peripheral extremities, joint stability, 5/5 strength, and normal gait. Skin: Warm and dry without rashes, lesions, cyanosis, clubbing or  ecchymosis.  Neuro: Cranial nerves intact, reflexes equal bilaterally. Normal muscle tone, no cerebellar symptoms. Sensation intact.  Pysch: Awake and oriented X 3, normal affect, Insight and Judgment appropriate.   Assessment and Plan  1. Labile hypertension  - EKG 12-Lead - [place], RETROPERITNL ABD,  LTD  2. Mixed hyperlipidemia   3. Prediabetes   4. Vitamin D deficiency   5. Screening for  rectal cancer  - POC Hemoccult Bld/Stl (3-Cd Home Screen); Future  6. Screening examination for pulmonary tuberculosis  - PPD  7. Gastroesophageal reflux disease, esophagitis presence not specified   8. Morbid Obesity (BMI 37.11)   9. Medication management    Continue prudent diet as discussed, weight control, BP monitoring, regular exercise, and medications. Discussed med's effects and SE's. Screening labs were requested by declined by patient today as she admitted very poor compliance to diet over the last 12 weeks and preferred to check at a 3 month follow up.

## 2014-07-13 NOTE — Patient Instructions (Signed)
Recommend the book "The END of DIETING" by Dr Excell Seltzer   & the book "The END of DIABETES " by Dr Excell Seltzer  At [email or web address] - get book & Audio CD's      Being diabetic has a  300% increased risk for heart attack, stroke, cancer, and alzheimer- type vascular dementia. It is very important that you work harder with diet by avoiding all foods that are white. Avoid white rice (brown & wild rice is OK), white potatoes (sweetpotatoes in moderation is OK), White bread or wheat bread or anything made out of white flour like bagels, donuts, rolls, buns, biscuits, cakes, pastries, cookies, pizza crust, and pasta (made from white flour & egg whites) - vegetarian pasta or spinach or wheat pasta is OK. Multigrain breads like Arnold's or [employer], or multigrain sandwich thins or flatbreads.  Diet, exercise and weight loss can reverse and cure diabetes in the early stages.  Diet, exercise and weight loss is very important in the control and prevention of complications of diabetes which affects every system in your body, ie. Brain - dementia/stroke, eyes - glaucoma/blindness, heart - heart attack/heart failure, kidneys - dialysis, stomach - gastric paralysis, intestines - malabsorption, nerves - severe painful neuritis, circulation - gangrene & loss of a leg(s), and finally cancer and Alzheimers.    I recommend avoid fried & greasy foods,  sweets/candy, white rice (brown or wild rice or Quinoa is OK), white potatoes (sweet potatoes are OK) - anything made from white flour - bagels, doughnuts, rolls, buns, biscuits,white and wheat breads, pizza crust and traditional pasta made of white flour & egg white(vegetarian pasta or spinach or wheat pasta is OK).  Multi-grain bread is OK - like multi-grain flat bread or sandwich thins. Avoid alcohol in excess. Exercise is also important.    Eat all the vegetables you want - avoid meat, especially red meat and dairy - especially cheese.  Cheese is the most  concentrated form of trans-fats which is the worst thing to clog up our arteries. Veggie cheese is OK which can be found in the fresh produce section at [employer] or [employer] or [employer]  Preventive Care for Adults A healthy lifestyle and preventive care can promote health and wellness. Preventive health guidelines for women include the following key practices.  A routine yearly physical is a good way to check with your health care provider about your health and preventive screening. It is a chance to share any concerns and updates on your health and to receive a thorough exam.  Visit your dentist for a routine exam and preventive care every 6 months. Brush your teeth twice a day and floss once a day. Good oral hygiene prevents tooth decay and gum disease.  The frequency of eye exams is based on your age, health, family medical history, use of contact lenses, and other factors. Follow your health care provider's recommendations for frequency of eye exams.  Eat a healthy diet. Foods like vegetables, fruits, whole grains, low-fat dairy products, and lean protein foods contain the nutrients you need without too many calories. Decrease your intake of foods high in solid fats, added sugars, and salt. Eat the right amount of calories for you.Get information about a proper diet from your health care provider, if necessary.  Regular physical exercise is one of the most important things you can do for your health. Most adults should get at least 150 minutes of moderate-intensity exercise (any activity that increases your heart rate and  causes you to sweat) each week. In addition, most adults need muscle-strengthening exercises on 2 or more days a week.  Maintain a healthy weight. The body mass index (BMI) is a screening tool to identify possible weight problems. It provides an estimate of body fat based on height and weight. Your health care provider can find your BMI and can help you achieve or  maintain a healthy weight.For adults 20 years and older:  A BMI below 18.5 is considered underweight.  A BMI of 18.5 to 24.9 is normal.  A BMI of 25 to 29.9 is considered overweight.  A BMI of 30 and above is considered obese.  Maintain normal blood lipids and cholesterol levels by exercising and minimizing your intake of saturated fat. Eat a balanced diet with plenty of fruit and vegetables. Blood tests for lipids and cholesterol should begin at age 57 and be repeated every 5 years. If your lipid or cholesterol levels are high, you are over 50, or you are at high risk for heart disease, you may need your cholesterol levels checked more frequently.Ongoing high lipid and cholesterol levels should be treated with medicines if diet and exercise are not working.  If you smoke, find out from your health care provider how to quit. If you do not use tobacco, do not start.  Lung cancer screening is recommended for adults aged 52-80 years who are at high risk for developing lung cancer because of a history of smoking. A yearly low-dose CT scan of the lungs is recommended for people who have at least a 30-pack-year history of smoking and are a current smoker or have quit within the past 15 years. A pack year of smoking is smoking an average of 1 pack of cigarettes a day for 1 year (for example: 1 pack a day for 30 years or 2 packs a day for 15 years). Yearly screening should continue until the smoker has stopped smoking for at least 15 years. Yearly screening should be stopped for people who develop a health problem that would prevent them from having lung cancer treatment.  High blood pressure causes heart disease and increases the risk of stroke. Your blood pressure should be checked at least every 1 to 2 years. Ongoing high blood pressure should be treated with medicines if weight loss and exercise do not work.  If you are 43-25 years old, ask your health care provider if you should take aspirin to  prevent strokes.  Diabetes screening involves taking a blood sample to check your fasting blood sugar level. This should be done once every 3 years, after age 36, if you are within normal weight and without risk factors for diabetes. Testing should be considered at a younger age or be carried out more frequently if you are overweight and have at least 1 risk factor for diabetes.  Breast cancer screening is essential preventive care for women. You should practice "breast self-awareness." This means understanding the normal appearance and feel of your breasts and may include breast self-examination. Any changes detected, no matter how small, should be reported to a health care provider. Women in their 73s and 30s should have a clinical breast exam (CBE) by a health care provider as part of a regular health exam every 1 to 3 years. After age 12, women should have a CBE every year. Starting at age 75, women should consider having a mammogram (breast X-ray test) every year. Women who have a family history of breast cancer should talk to  their health care provider about genetic screening. Women at a high risk of breast cancer should talk to their health care providers about having an MRI and a mammogram every year.  Breast cancer gene (BRCA)-related cancer risk assessment is recommended for women who have family members with BRCA-related cancers. BRCA-related cancers include breast, ovarian, tubal, and peritoneal cancers. Having family members with these cancers may be associated with an increased risk for harmful changes (mutations) in the breast cancer genes BRCA1 and BRCA2. Results of the assessment will determine the need for genetic counseling and BRCA1 and BRCA2 testing.  Routine pelvic exams to screen for cancer are no longer recommended for nonpregnant women who are considered low risk for cancer of the pelvic organs (ovaries, uterus, and vagina) and who do not have symptoms. Ask your health care provider  if a screening pelvic exam is right for you.  If you have had past treatment for cervical cancer or a condition that could lead to cancer, you need Pap tests and screening for cancer for at least 20 years after your treatment. If Pap tests have been discontinued, your risk factors (such as having a new sexual partner) need to be reassessed to determine if screening should be resumed. Some women have medical problems that increase the chance of getting cervical cancer. In these cases, your health care provider may recommend more frequent screening and Pap tests.  Colorectal cancer can be detected and often prevented. Most routine colorectal cancer screening begins at the age of 63 years and continues through age 41 years. However, your health care provider may recommend screening at an earlier age if you have risk factors for colon cancer. On a yearly basis, your health care provider may provide home test kits to check for hidden blood in the stool. Use of a small camera at the end of a tube, to directly examine the colon (sigmoidoscopy or colonoscopy), can detect the earliest forms of colorectal cancer. Talk to your health care provider about this at age 57, when routine screening begins. Direct exam of the colon should be repeated every 5-10 years through age 65 years, unless early forms of pre-cancerous polyps or small growths are found.  Hepatitis C blood testing is recommended for all people born from [birth year] through 1965 and any individual with known risks for hepatitis C.  Pra  Osteoporosis is a disease in which the bones lose minerals and strength with aging. This can result in serious bone fractures or breaks. The risk of osteoporosis can be identified using a bone density scan. Women ages 78 years and over and women at risk for fractures or osteoporosis should discuss screening with their health care providers. Ask your health care provider whether you should take a calcium supplement or vitamin D  to reduce the rate of osteoporosis.  Menopause can be associated with physical symptoms and risks. Hormone replacement therapy is available to decrease symptoms and risks. You should talk to your health care provider about whether hormone replacement therapy is right for you.  Use sunscreen. Apply sunscreen liberally and repeatedly throughout the day. You should seek shade when your shadow is shorter than you. Protect yourself by wearing long sleeves, pants, a wide-brimmed hat, and sunglasses year round, whenever you are outdoors.  Once a month, do a whole body skin exam, using a mirror to look at the skin on your back. Tell your health care provider of new moles, moles that have irregular borders, moles that are larger than a pencil  eraser, or moles that have changed in shape or color.  Stay current with required vaccines (immunizations).  Influenza vaccine. All adults should be immunized every year.  Tetanus, diphtheria, and acellular pertussis (Td, Tdap) vaccine. Pregnant women should receive 1 dose of Tdap vaccine during each pregnancy. The dose should be obtained regardless of the length of time since the last dose. Immunization is preferred during the 27th-36th week of gestation. An adult who has not previously received Tdap or who does not know her vaccine status should receive 1 dose of Tdap. This initial dose should be followed by tetanus and diphtheria toxoids (Td) booster doses every 10 years. Adults with an unknown or incomplete history of completing a 3-dose immunization series with Td-containing vaccines should begin or complete a primary immunization series including a Tdap dose. Adults should receive a Td booster every 10 years.  Varicella vaccine. An adult without evidence of immunity to varicella should receive 2 doses or a second dose if she has previously received 1 dose. Pregnant females who do not have evidence of immunity should receive the first dose after pregnancy. This first  dose should be obtained before leaving the health care facility. The second dose should be obtained 4-8 weeks after the first dose.  Human papillomavirus (HPV) vaccine. Females aged 13-26 years who have not received the vaccine previously should obtain the 3-dose series. The vaccine is not recommended for use in pregnant females. However, pregnancy testing is not needed before receiving a dose. If a female is found to be pregnant after receiving a dose, no treatment is needed. In that case, the remaining doses should be delayed until after the pregnancy. Immunization is recommended for any person with an immunocompromised condition through the age of 24 years if she did not get any or all doses earlier. During the 3-dose series, the second dose should be obtained 4-8 weeks after the first dose. The third dose should be obtained 24 weeks after the first dose and 16 weeks after the second dose.  Zoster vaccine. One dose is recommended for adults aged 52 years or older unless certain conditions are present.  Measles, mumps, and rubella (MMR) vaccine. Adults born before [birth year] generally are considered immune to measles and mumps. Adults born in [birth year] or later should have 1 or more doses of MMR vaccine unless there is a contraindication to the vaccine or there is laboratory evidence of immunity to each of the three diseases. A routine second dose of MMR vaccine should be obtained at least 28 days after the first dose for students attending postsecondary schools, health care workers, or international travelers. People who received inactivated measles vaccine or an unknown type of measles vaccine during 1963-1967 should receive 2 doses of MMR vaccine. People who received inactivated mumps vaccine or an unknown type of mumps vaccine before 1979 and are at high risk for mumps infection should consider immunization with 2 doses of MMR vaccine. For females of childbearing age, rubella immunity should be determined. If there  is no evidence of immunity, females who are not pregnant should be vaccinated. If there is no evidence of immunity, females who are pregnant should delay immunization until after pregnancy. Unvaccinated health care workers born before [birth year] who lack laboratory evidence of measles, mumps, or rubella immunity or laboratory confirmation of disease should consider measles and mumps immunization with 2 doses of MMR vaccine or rubella immunization with 1 dose of MMR vaccine.  Pneumococcal 13-valent conjugate (PCV13) vaccine. When indicated, a person who  is uncertain of her immunization history and has no record of immunization should receive the PCV13 vaccine. An adult aged 68 years or older who has certain medical conditions and has not been previously immunized should receive 1 dose of PCV13 vaccine. This PCV13 should be followed with a dose of pneumococcal polysaccharide (PPSV23) vaccine. The PPSV23 vaccine dose should be obtained at least 8 weeks after the dose of PCV13 vaccine. An adult aged 60 years or older who has certain medical conditions and previously received 1 or more doses of PPSV23 vaccine should receive 1 dose of PCV13. The PCV13 vaccine dose should be obtained 1 or more years after the last PPSV23 vaccine dose.    Pneumococcal polysaccharide (PPSV23) vaccine. When PCV13 is also indicated, PCV13 should be obtained first. All adults aged 42 years and older should be immunized. An adult younger than age 80 years who has certain medical conditions should be immunized. Any person who resides in a nursing home or long-term care facility should be immunized. An adult smoker should be immunized. People with an immunocompromised condition and certain other conditions should receive both PCV13 and PPSV23 vaccines. People with human immunodeficiency virus (HIV) infection should be immunized as soon as possible after diagnosis. Immunization during chemotherapy or radiation therapy should be avoided. Routine use  of PPSV23 vaccine is not recommended for American Indians, [place] Natives, or people younger than 65 years unless there are medical conditions that require PPSV23 vaccine. When indicated, people who have unknown immunization and have no record of immunization should receive PPSV23 vaccine. One-time revaccination 5 years after the first dose of PPSV23 is recommended for people aged 19-64 years who have chronic kidney failure, nephrotic syndrome, asplenia, or immunocompromised conditions. People who received 1-2 doses of PPSV23 before age 70 years should receive another dose of PPSV23 vaccine at age 68 years or later if at least 5 years have passed since the previous dose. Doses of PPSV23 are not needed for people immunized with PPSV23 at or after age 38 years.  Preventive Services / Frequency   Ages 9 to 79 years  Blood pressure check.  Lipid and cholesterol check.  Lung cancer screening. / Every year if you are aged 51-80 years and have a 30-pack-year history of smoking and currently smoke or have quit within the past 15 years. Yearly screening is stopped once you have quit smoking for at least 15 years or develop a health problem that would prevent you from having lung cancer treatment.  Clinical breast exam.** / Every year after age 32 years.  BRCA-related cancer risk assessment.** / For women who have family members with a BRCA-related cancer (breast, ovarian, tubal, or peritoneal cancers).  Mammogram.** / Every year beginning at age 21 years and continuing for as long as you are in good health. Consult with your health care provider.  Pap test.** / Every 3 years starting at age 56 years through age 72 or 3 years with a history of 3 consecutive normal Pap tests.  HPV screening.** / Every 3 years from ages 39 years through ages 22 to 61 years with a history of 3 consecutive normal Pap tests.  Fecal occult blood test (FOBT) of stool. / Every year beginning at age 18 years and continuing  until age 70 years. You may not need to do this test if you get a colonoscopy every 10 years.  Flexible sigmoidoscopy or colonoscopy.** / Every 5 years for a flexible sigmoidoscopy or every 10 years for a colonoscopy beginning  at age 78 years and continuing until age 65 years.  Hepatitis C blood test.** / For all people born from [birth year] through 1964 and any individual with known risks for hepatitis C.  Skin self-exam. / Monthly.  Influenza vaccine. / Every year.  Tetanus, diphtheria, and acellular pertussis (Tdap/Td) vaccine.** / Consult your health care provider. Pregnant women should receive 1 dose of Tdap vaccine during each pregnancy. 1 dose of Td every 10 years.  Varicella vaccine.** / Consult your health care provider. Pregnant females who do not have evidence of immunity should receive the first dose after pregnancy.  Zoster vaccine.** / 1 dose for adults aged 64 years or older.  Pneumococcal 13-valent conjugate (PCV13) vaccine.** / Consult your health care provider.  Pneumococcal polysaccharide (PPSV23) vaccine.** / 1 to 2 doses if you smoke cigarettes or if you have certain conditions.  Meningococcal vaccine.** / Consult your health care provider.  Hepatitis A vaccine.** / Consult your health care provider.  Hepatitis B vaccine.** / Consult your health care provider. Screening for abdominal aortic aneurysm (AAA)  by ultrasound is recommended for people over 50 who have history of high blood pressure or who are current or former smokers.

## 2014-08-10 LAB — TB SKIN TEST
Induration: 0 mm
TB Skin Test: NEGATIVE

## 2014-10-13 ENCOUNTER — Other Ambulatory Visit: Payer: Self-pay

## 2014-10-27 ENCOUNTER — Ambulatory Visit: Payer: Self-pay | Admitting: Internal Medicine

## 2014-11-11 ENCOUNTER — Emergency Department (HOSPITAL_BASED_OUTPATIENT_CLINIC_OR_DEPARTMENT_OTHER): Payer: BLUE CROSS/BLUE SHIELD

## 2014-11-11 ENCOUNTER — Encounter (HOSPITAL_BASED_OUTPATIENT_CLINIC_OR_DEPARTMENT_OTHER): Payer: Self-pay

## 2014-11-11 ENCOUNTER — Emergency Department (HOSPITAL_BASED_OUTPATIENT_CLINIC_OR_DEPARTMENT_OTHER)
Admission: EM | Admit: 2014-11-11 | Discharge: 2014-11-11 | Discharge status: Against Medical Advice | Payer: BLUE CROSS/BLUE SHIELD | Attending: Emergency Medicine | Admitting: Emergency Medicine

## 2014-11-11 DIAGNOSIS — E559 Vitamin D deficiency, unspecified: Secondary | ICD-10-CM | POA: Insufficient documentation

## 2014-11-11 DIAGNOSIS — Z7982 Long term (current) use of aspirin: Secondary | ICD-10-CM | POA: Diagnosis not present

## 2014-11-11 DIAGNOSIS — R112 Nausea with vomiting, unspecified: Secondary | ICD-10-CM | POA: Insufficient documentation

## 2014-11-11 DIAGNOSIS — I10 Essential (primary) hypertension: Secondary | ICD-10-CM | POA: Diagnosis not present

## 2014-11-11 DIAGNOSIS — R410 Disorientation, unspecified: Secondary | ICD-10-CM | POA: Diagnosis not present

## 2014-11-11 DIAGNOSIS — R4182 Altered mental status, unspecified: Secondary | ICD-10-CM | POA: Diagnosis present

## 2014-11-11 DIAGNOSIS — H539 Unspecified visual disturbance: Secondary | ICD-10-CM | POA: Diagnosis not present

## 2014-11-11 DIAGNOSIS — R51 Headache: Secondary | ICD-10-CM | POA: Insufficient documentation

## 2014-11-11 DIAGNOSIS — E669 Obesity, unspecified: Secondary | ICD-10-CM | POA: Diagnosis not present

## 2014-11-11 DIAGNOSIS — Z79899 Other long term (current) drug therapy: Secondary | ICD-10-CM | POA: Diagnosis not present

## 2014-11-11 DIAGNOSIS — Z86018 Personal history of other benign neoplasm: Secondary | ICD-10-CM | POA: Insufficient documentation

## 2014-11-11 DIAGNOSIS — J45909 Unspecified asthma, uncomplicated: Secondary | ICD-10-CM | POA: Insufficient documentation

## 2014-11-11 DIAGNOSIS — M797 Fibromyalgia: Secondary | ICD-10-CM | POA: Diagnosis not present

## 2014-11-11 DIAGNOSIS — Z8719 Personal history of other diseases of the digestive system: Secondary | ICD-10-CM | POA: Diagnosis not present

## 2014-11-11 DIAGNOSIS — R519 Headache, unspecified: Secondary | ICD-10-CM

## 2014-11-11 LAB — CBC WITH DIFFERENTIAL/PLATELET
Basophils Absolute: 0 10*3/uL (ref 0.0–0.1)
Basophils Relative: 0 % (ref 0–1)
Eosinophils Absolute: 0.2 10*3/uL (ref 0.0–0.7)
Eosinophils Relative: 2 % (ref 0–5)
HCT: 40.7 % (ref 36.0–46.0)
Hemoglobin: 13.4 g/dL (ref 12.0–15.0)
Lymphocytes Relative: 30 % (ref 12–46)
Lymphs Abs: 3.1 10*3/uL (ref 0.7–4.0)
MCH: 30.8 pg (ref 26.0–34.0)
MCHC: 32.9 g/dL (ref 30.0–36.0)
MCV: 93.6 fL (ref 78.0–100.0)
Monocytes Absolute: 0.8 10*3/uL (ref 0.1–1.0)
Monocytes Relative: 8 % (ref 3–12)
Neutro Abs: 6.2 10*3/uL (ref 1.7–7.7)
Neutrophils Relative %: 60 % (ref 43–77)
Platelets: 326 10*3/uL (ref 150–400)
RBC: 4.35 MIL/uL (ref 3.87–5.11)
RDW: 12.6 % (ref 11.5–15.5)
WBC: 10.4 10*3/uL (ref 4.0–10.5)

## 2014-11-11 LAB — BASIC METABOLIC PANEL
Anion gap: 9 (ref 5–15)
BUN: 15 mg/dL (ref 6–20)
CO2: 29 mmol/L (ref 22–32)
Calcium: 9.3 mg/dL (ref 8.9–10.3)
Chloride: 102 mmol/L (ref 101–111)
Creatinine, Ser: 0.92 mg/dL (ref 0.44–1.00)
GFR calc Af Amer: >60 mL/min (ref >60)
GFR calc non Af Amer: >60 mL/min (ref >60)
Glucose, Bld: 108 mg/dL — ABNORMAL HIGH (ref 65–99)
Potassium: 3.8 mmol/L (ref 3.5–5.1)
Sodium: 140 mmol/L (ref 135–145)

## 2014-11-11 LAB — CBG MONITORING, ED: Glucose-Capillary: 99 mg/dL (ref 65–99)

## 2014-11-11 MED ORDER — ONDANSETRON 4 MG PO TBDP
4.0000 mg | ORAL_TABLET | Freq: Once | ORAL | Status: DC
Start: 2014-11-11 — End: 2014-11-11
  Filled 2014-11-11: qty 1

## 2014-11-11 NOTE — ED Provider Notes (Signed)
CSN: [id number]     Arrival date & time 11/11/14  1844 History  This chart was scribed for Bridget Sorrow, MD by Starleen Arms, ED Scribe. This patient was seen in room MH10/MH10 and the patient's care was started at 7:10 PM.   Chief Complaint  Patient presents with  . Altered Mental Status   The history is provided by the patient. No language interpreter was used.   HPI Comments: Bridget Joyce is a 59 y.o. female who presents to the Emergency Department complaining of altered mental status onset 4:30 pm today.  At the time of onset, the patient reports an "aura" described as "like looking through water."  She also reports a left-sided headache, difficulty comprehending emails, and an inability to recall coworkers names.  All symptoms have resolved except for a slight headache, and new nausea/vomiting.  She took Excedrin Migraine PTA.  The patient is accompanied by her coworker because she was unable to drive.  She has had three similar episodes with the past 6 months that included a similar aura, headache, vision changes, and confusion.  These prior episodes resolved in approximately 1.5-2 hours with a slight lingering headache into the following day.  She has not been seen previously for these episodes but does have a prior history of migraine headaches (approximately 5 throughout her life until this year).  These migraine headaches, while having a similar aura, did not include confusion.    PCP: Dr. Melford Aase [facility])  Past Medical History  Diagnosis Date  . Fibroid   . Diverticulosis   . External hemorrhoids   . GERD (gastroesophageal reflux disease)   . Obesity   . Peripheral edema   . Allergy     rag weed, pollen,dust  . Bursitis of hip   . Anxiety   . Asthma   . Prediabetes   . Vitamin D deficiency   . Labile hypertension   . Fibromyalgia    Past Surgical History  Procedure Laterality Date  . Tonsillectomy    . Knee surgery      Left  . Lipoma  excision      Labial  . Abdominal hysterectomy  2002  . Mandible surgery    . Mouth surgery    . Colonoscopy  09/02/2006    external hemorrhoids,diverticulosis   Family History  Problem Relation Age of Onset  . Hypertension Father   . Heart disease Father   . Diabetes Father   . Heart failure Father   . Uterine cancer Sister   . Diabetes Sister   . Colon cancer Neg Hx   . Esophageal cancer Neg Hx   . Stomach cancer Neg Hx   . Rectal cancer Neg Hx    History  Substance Use Topics  . Smoking status: Never Smoker   . Smokeless tobacco: Never Used  . Alcohol Use: No   OB History    Gravida Para Term Preterm AB TAB SAB Ectopic Multiple Living   0              Review of Systems  Constitutional: Negative for fever and chills.  HENT: Negative for rhinorrhea and sore throat.   Eyes: Positive for visual disturbance.  Respiratory: Negative for cough and shortness of breath.   Cardiovascular: Negative for chest pain and leg swelling.  Gastrointestinal: Positive for nausea and vomiting. Negative for abdominal pain and diarrhea.  Genitourinary: Negative for dysuria.  Musculoskeletal: Negative for back pain and neck pain.  Skin: Negative for  rash.  Neurological: Positive for headaches. Negative for dizziness, weakness and numbness.  Hematological: Does not bruise/bleed easily.  Psychiatric/Behavioral: Positive for confusion.      Allergies  Review of patient's allergies indicates no known allergies.  Home Medications   Prior to Admission medications   Medication Sig Start Date End Date Taking? Authorizing Provider  albuterol (PROVENTIL HFA;VENTOLIN HFA) 108 (90 BASE) MCG/ACT inhaler Inhale into the lungs as needed.    Historical Provider, MD  albuterol (PROVENTIL) (2.5 MG/3ML) 0.083% nebulizer solution Take 2.5 mg by nebulization every 6 (six) hours as needed.      Historical Provider, MD  aspirin EC 81 MG tablet Take 81 mg by mouth. Take every third day.    Historical  Provider, MD  BUMETANIDE PO Take 1 tablet by mouth daily as needed. For fluid.    Historical Provider, MD  busPIRone (BUSPAR) 10 MG tablet Take 10 mg by mouth daily.  02/27/11   Historical Provider, MD  CALCIUM CITRATE PO Take by mouth daily.    Historical Provider, MD  Cholecalciferol (VITAMIN D) 2000 UNITS CAPS Take 1 capsule by mouth daily.    Historical Provider, MD  magnesium 30 MG tablet Take 30 mg by mouth daily.     Historical Provider, MD  Multiple Vitamin (MULTIVITAMIN WITH MINERALS) TABS Take 1 tablet by mouth daily.    Historical Provider, MD   BP 163/96 mmHg  Pulse 94  Temp(Src) 98.7 F (37.1 C) (Oral)  Resp 20  SpO2 97% Physical Exam  Constitutional: She is oriented to person, place, and time. She appears well-developed and well-nourished. No distress.  HENT:  Head: Normocephalic and atraumatic.  Mouth/Throat: Oropharynx is clear and moist.  Eyes: Conjunctivae and EOM are normal.  Sclera clear.  Eyes tracking normal.  Pupils normal.   Neck: Neck supple. No tracheal deviation present.  Cardiovascular: Normal rate, regular rhythm and normal heart sounds.   Pulmonary/Chest: Effort normal. No respiratory distress.  Lungs CTA bilaterally.  Abdominal: Bowel sounds are normal. There is no tenderness.  Musculoskeletal: Normal range of motion. She exhibits no edema.  Neurological: She is alert and oriented to person, place, and time. No cranial nerve deficit. She exhibits normal muscle tone. Coordination normal.  Skin: Skin is warm and dry.  Psychiatric: She has a normal mood and affect. Her behavior is normal.  Nursing note and vitals reviewed.   ED Course  Procedures (including critical care time)  DIAGNOSTIC STUDIES: Oxygen Saturation is 97% on RA, normal by my interpretation.    COORDINATION OF CARE:  7:50 PM Discussed option of head CT scan with patient.  I indicated this was the standard of care, but patient was adamant she receive an MRI due to radiation concerns.   She consented to having CT scan.    Labs Review Labs Reviewed  CBC WITH DIFFERENTIAL/PLATELET  BASIC METABOLIC PANEL  CBG MONITORING, ED    Imaging Review No results found.   EKG Interpretation None      MDM   Final diagnoses:  Headache    Patient insist on leaving AMA she does not want a CT of the head. Standard of care would dictate that CT head should be done to rule out any significant or life-threatening problems in the brain. Patient's symptoms have occurred 3 times since the first of the year no severe headaches with it but does have mild headache with it not sure if this is side of the symptoms are always the same it is possible  this could be a migraine equivalent however felt that CT head was required due to the significance of the symptoms to include some memory problems reading problems visual problems. Patient wants to follow-up with her doctor to have an MRI done done as an outpatient. Patient understands the potential risks that he could even be life-threatening if there is a brain bleed. Patient understands and still wants to go home.  The patient's paper medical record is not available during this visit. It has been removed from this office and cannot be located.   Bridget Sorrow, MD 11/11/14 2031

## 2014-11-11 NOTE — ED Notes (Signed)
Patient presents to ED after having an "aura" that began around 1630 at work. The patient states that it felt like she was looking through a "prism". The patient went to [facility] upon symptoms starting and she was referred here. The patient took 2 excedrin migraine pills at 1635. The patient reports having a hx of approx. 8 migraines in her life. The patient is currently vomiting in the room and has not vomited today at all. The patient is oriented x4 but coworker that is with patient reports she has been confused.

## 2014-11-11 NOTE — ED Notes (Signed)
Patient had one episode of vomiting and reports feeling relief after.

## 2014-11-11 NOTE — ED Notes (Addendum)
Patient talked to by 2 separate nurses, the patient is a/o x 4 and knows that she does not want the treatment here. She would like to call her PMD in the am and request an MRI from her PMD. Md zackowski aware of this and would like patient to sign out AMA. Patient is agreeable to this after she has had the risks of leaving explained. Patient does not want any further treatment. Friend at the bedside for conversation with patient from Both this Rn and Bonney Roussel, Therapist, sports.

## 2014-11-11 NOTE — ED Notes (Signed)
Pt refused ct scan

## 2014-11-12 ENCOUNTER — Encounter: Payer: Self-pay | Admitting: Physician Assistant

## 2014-11-12 ENCOUNTER — Ambulatory Visit (INDEPENDENT_AMBULATORY_CARE_PROVIDER_SITE_OTHER): Payer: BLUE CROSS/BLUE SHIELD | Admitting: Physician Assistant

## 2014-11-12 VITALS — BP 130/88 | HR 68 | Temp 97.9°F | Resp 16 | Ht 65.25 in | Wt 228.0 lb

## 2014-11-12 DIAGNOSIS — E559 Vitamin D deficiency, unspecified: Secondary | ICD-10-CM

## 2014-11-12 DIAGNOSIS — R471 Dysarthria and anarthria: Secondary | ICD-10-CM

## 2014-11-12 DIAGNOSIS — R7303 Prediabetes: Secondary | ICD-10-CM

## 2014-11-12 DIAGNOSIS — R0989 Other specified symptoms and signs involving the circulatory and respiratory systems: Secondary | ICD-10-CM

## 2014-11-12 DIAGNOSIS — E782 Mixed hyperlipidemia: Secondary | ICD-10-CM

## 2014-11-12 DIAGNOSIS — I10 Essential (primary) hypertension: Secondary | ICD-10-CM

## 2014-11-12 DIAGNOSIS — Z79899 Other long term (current) drug therapy: Secondary | ICD-10-CM

## 2014-11-12 DIAGNOSIS — R7309 Other abnormal glucose: Secondary | ICD-10-CM

## 2014-11-12 DIAGNOSIS — G43809 Other migraine, not intractable, without status migrainosus: Secondary | ICD-10-CM

## 2014-11-12 LAB — HEPATIC FUNCTION PANEL
ALT: 21 U/L (ref 0–35)
AST: 20 U/L (ref 0–37)
Albumin: 4.1 g/dL (ref 3.5–5.2)
Alkaline Phosphatase: 97 U/L (ref 39–117)
Bilirubin, Direct: 0.1 mg/dL (ref 0.0–0.3)
Indirect Bilirubin: 0.4 mg/dL (ref 0.2–1.2)
Total Bilirubin: 0.5 mg/dL (ref 0.2–1.2)
Total Protein: 6.8 g/dL (ref 6.0–8.3)

## 2014-11-12 LAB — LIPID PANEL
Cholesterol: 189 mg/dL (ref 0–200)
HDL: 63 mg/dL (ref >=46)
LDL Cholesterol: 109 mg/dL — ABNORMAL HIGH (ref 0–99)
Total CHOL/HDL Ratio: 3 Ratio
Triglycerides: 83 mg/dL (ref <150)
VLDL: 17 mg/dL (ref 0–40)

## 2014-11-12 LAB — BASIC METABOLIC PANEL WITH GFR
BUN: 10 mg/dL (ref 6–23)
CO2: 31 mEq/L (ref 19–32)
Calcium: 9.6 mg/dL (ref 8.4–10.5)
Chloride: 106 mEq/L (ref 96–112)
Creat: 0.86 mg/dL (ref 0.50–1.10)
GFR, Est African American: 86 mL/min
GFR, Est Non African American: 75 mL/min
Glucose, Bld: 83 mg/dL (ref 70–99)
Potassium: 4.1 mEq/L (ref 3.5–5.3)
Sodium: 145 mEq/L (ref 135–145)

## 2014-11-12 LAB — CBC WITH DIFFERENTIAL/PLATELET
Basophils Absolute: 0.1 10*3/uL (ref 0.0–0.1)
Basophils Relative: 1 % (ref 0–1)
Eosinophils Absolute: 0.1 10*3/uL (ref 0.0–0.7)
Eosinophils Relative: 2 % (ref 0–5)
HCT: 39.6 % (ref 36.0–46.0)
Hemoglobin: 13 g/dL (ref 12.0–15.0)
Lymphocytes Relative: 31 % (ref 12–46)
Lymphs Abs: 2 10*3/uL (ref 0.7–4.0)
MCH: 30.5 pg (ref 26.0–34.0)
MCHC: 32.8 g/dL (ref 30.0–36.0)
MCV: 93 fL (ref 78.0–100.0)
MPV: 10.7 fL (ref 8.6–12.4)
Monocytes Absolute: 0.4 10*3/uL (ref 0.1–1.0)
Monocytes Relative: 6 % (ref 3–12)
Neutro Abs: 4 10*3/uL (ref 1.7–7.7)
Neutrophils Relative %: 60 % (ref 43–77)
Platelets: 340 10*3/uL (ref 150–400)
RBC: 4.26 MIL/uL (ref 3.87–5.11)
RDW: 13.8 % (ref 11.5–15.5)
WBC: 6.6 10*3/uL (ref 4.0–10.5)

## 2014-11-12 LAB — MAGNESIUM: Magnesium: 1.9 mg/dL (ref 1.5–2.5)

## 2014-11-12 LAB — HEMOGLOBIN A1C
Hgb A1c MFr Bld: 6.1 % — ABNORMAL HIGH (ref <5.7)
Mean Plasma Glucose: 128 mg/dL — ABNORMAL HIGH (ref <117)

## 2014-11-12 LAB — TSH: TSH: 2.335 u[IU]/mL (ref 0.350–4.500)

## 2014-11-12 NOTE — Progress Notes (Signed)
Assessment and Plan:  1. Hypertension -Continue medication, monitor blood pressure at home. Continue DASH diet.  Reminder to go to the ER if any CP, SOB, nausea, dizziness, severe HA, changes vision/speech, left arm numbness and tingling and jaw pain.  2. Cholesterol -Continue diet and exercise. Check cholesterol.   3. Prediabetes  -Continue diet and exercise. Check A1C  4. Vitamin D Def - check level and continue medications.   5. Migraines with aura, confusion, dysarthria, history of abnormal muscle weakness, decreased hearing, and tinnitus- now with normal neuro exam Will get MRI with and without to rule out acoustic neuroma, mass, MS  Continue diet and meds as discussed. Further disposition pending results of labs. Over 30 minutes of exam, counseling, chart review, and critical decision making was performed  HPI 59 y.o. female  presents for 3 month follow up on hypertension, cholesterol, prediabetes, and vitamin D deficiency and migraines, did not get labs last visit.   Her blood pressure has been controlled at home, today their BP is BP: 130/88 mmHg  She does workout, shoots basketball 2 x a week for 30 mins without issues. She denies chest pain, shortness of breath, dizziness.  She is not on cholesterol medication and denies myalgias. : Last lipids were at goal - Chol 191, Trig 82, HDL 80 & LDL 956 in Oct 2014.  She has been working on diet and exercise for prediabetes, and denies paresthesia of the feet, polydipsia and polyuria. Last A1C in the office was: A1C 6.28 Jan 2013 Patient is on Vitamin D supplement.   She also complains of migraine HA with aura. Went to the ER yesterday and declines CT scan due to radiation risk and wanted MRI which they could not provide, so patient left AMA and followed up here today. She is on bASA every 3 days.  Has had headache x 3 times this year. She will be sitting and then start with aura (lines/colors), will start with left sided temporal  headache, and will have confusion, dysarthria, and some nausea with vomiting x 1 . Denies weakness, double vision, facial parylsis, dizziness. Started at 430pm, took two excedrin migraine and lasted about an hour.   Current Medications:  Current Outpatient Prescriptions on File Prior to Visit  Medication Sig Dispense Refill  . albuterol (PROVENTIL) (2.5 MG/3ML) 0.083% nebulizer solution Take 2.5 mg by nebulization every 6 (six) hours as needed.      Marland Kitchen aspirin EC 81 MG tablet Take 81 mg by mouth. Take every third day.    . BUMETANIDE PO Take 1 tablet by mouth daily as needed. For fluid.    . busPIRone (BUSPAR) 10 MG tablet Take 10 mg by mouth daily.     Marland Kitchen CALCIUM CITRATE PO Take by mouth daily.    . Cholecalciferol (VITAMIN D) 2000 UNITS CAPS Take 1 capsule by mouth daily.    . magnesium 30 MG tablet Take 30 mg by mouth daily.     . Multiple Vitamin (MULTIVITAMIN WITH MINERALS) TABS Take 1 tablet by mouth daily.     No current facility-administered medications on file prior to visit.   Medical History:  Past Medical History  Diagnosis Date  . Fibroid   . Diverticulosis   . External hemorrhoids   . GERD (gastroesophageal reflux disease)   . Obesity   . Peripheral edema   . Allergy     rag weed, pollen,dust  . Bursitis of hip   . Anxiety   . Asthma   .  Prediabetes   . Vitamin D deficiency   . Labile hypertension   . Fibromyalgia    Allergies: No Known Allergies   Review of Systems:  Review of Systems  Constitutional: Negative for fever, chills, weight loss, malaise/fatigue and diaphoresis.  HENT: Positive for hearing loss and tinnitus. Negative for congestion, ear discharge, ear pain, nosebleeds and sore throat.   Eyes: Positive for blurred vision. Negative for double vision, photophobia, pain, discharge and redness.  Respiratory: Negative.  Negative for stridor.   Cardiovascular: Negative.   Gastrointestinal: Negative.   Genitourinary: Negative.   Musculoskeletal: Negative  for myalgias, back pain, joint pain, falls and neck pain.       + muscle weakness with extended movement, has had negative work up at [facility]: Negative.  Negative for rash.  Neurological: Positive for speech change and headaches. Negative for dizziness, tingling, tremors, sensory change, focal weakness, seizures, loss of consciousness and weakness.  Psychiatric/Behavioral: Positive for memory loss. Negative for depression, suicidal ideas, hallucinations and substance abuse. The patient is not nervous/anxious and does not have insomnia.     Family history- Review and unchanged Social history- Review and unchanged Physical Exam: BP 130/88 mmHg  Pulse 68  Temp(Src) 97.9 F (36.6 C)  Resp 16  Ht 5' 5.25" (1.657 m)  Wt 230 lb (104.327 kg)  BMI 38.00 kg/m2 Wt Readings from Last 3 Encounters:  11/12/14 230 lb (104.327 kg)  07/13/14 224 lb 9.6 oz (101.878 kg)  12/18/13 228 lb (103.42 kg)   General Appearance: Well nourished, in no apparent distress. Eyes: PERRLA, EOMs except has had eye surgery and has abnormal gaze with right eye with tracking, conjunctiva no swelling or erythema Sinuses: No Frontal/maxillary tenderness ENT/Mouth: Ext aud canals clear, TMs without erythema, bulging. No erythema, swelling, or exudate on post pharynx.  Tonsils not swollen or erythematous. Hearing normal.  Neck: Supple, thyroid normal.  Respiratory: Respiratory effort normal, BS equal bilaterally without rales, rhonchi, wheezing or stridor.  Cardio: RRR with no MRGs. Brisk peripheral pulses without edema.  Abdomen: Soft, + BS, obese,  Non tender, no guarding, rebound, hernias, masses. Lymphatics: Non tender without lymphadenopathy.  Musculoskeletal: Full ROM, 5/5 strength, Normal gait Skin: Warm, dry without rashes, lesions, ecchymosis.  Neuro: Cranial nerves intact. Normal muscle tone, no cerebellar symptoms. Psych: Awake and oriented X 3, normal affect, Insight and Judgment appropriate.    Vicie Mutters, PA-C 11:55 AM [facility]

## 2014-11-12 NOTE — Patient Instructions (Addendum)
Migraine Headache A migraine headache is an intense, throbbing pain on one or both sides of your head. A migraine can last for 30 minutes to several hours. CAUSES  The exact cause of a migraine headache is not always known. However, a migraine may be caused when nerves in the brain become irritated and release chemicals that cause inflammation. This causes pain. Certain things may also trigger migraines, such as:  Alcohol.  Smoking.  Stress.  Menstruation.  Aged cheeses.  Foods or drinks that contain nitrates, glutamate, aspartame, or tyramine.  Lack of sleep.  Chocolate.  Caffeine.  Hunger.  Physical exertion.  Fatigue.  Medicines used to treat chest pain (nitroglycerine), birth control pills, estrogen, and some blood pressure medicines. SIGNS AND SYMPTOMS  Pain on one or both sides of your head.  Pulsating or throbbing pain.  Severe pain that prevents daily activities.  Pain that is aggravated by any physical activity.  Nausea, vomiting, or both.  Dizziness.  Pain with exposure to bright lights, loud noises, or activity.  General sensitivity to bright lights, loud noises, or smells. Before you get a migraine, you may get warning signs that a migraine is coming (aura). An aura may include:  Seeing flashing lights.  Seeing bright spots, halos, or zigzag lines.  Having tunnel vision or blurred vision.  Having feelings of numbness or tingling.  Having trouble talking.  Having muscle weakness. DIAGNOSIS  A migraine headache is often diagnosed based on:  Symptoms.  Physical exam.  A CT scan or MRI of your head. These imaging tests cannot diagnose migraines, but they can help rule out other causes of headaches. TREATMENT Medicines may be given for pain and nausea. Medicines can also be given to help prevent recurrent migraines.  HOME CARE INSTRUCTIONS  Only take over-the-counter or prescription medicines for pain or discomfort as directed by your  health care provider. The use of long-term narcotics is not recommended.  Lie down in a dark, quiet room when you have a migraine.  Keep a journal to find out what may trigger your migraine headaches. For example, write down:  What you eat and drink.  How much sleep you get.  Any change to your diet or medicines.  Limit alcohol consumption.  Quit smoking if you smoke.  Get 7-9 hours of sleep, or as recommended by your health care provider.  Limit stress.  Keep lights dim if bright lights bother you and make your migraines worse. SEEK IMMEDIATE MEDICAL CARE IF:   Your migraine becomes severe.  You have a fever.  You have a stiff neck.  You have vision loss.  You have muscular weakness or loss of muscle control.  You start losing your balance or have trouble walking.  You feel faint or pass out.  You have severe symptoms that are different from your first symptoms. MAKE SURE YOU:   Understand these instructions.  Will watch your condition.  Will get help right away if you are not doing well or get worse. Document Released: 04/16/2005 Document Revised: 08/31/2013 Document Reviewed: 12/22/2012 [facility] Patient Information 2015 [place], [place]. This information is not intended to replace advice given to you by your health care provider. Make sure you discuss any questions you have with your health care provider.  Spinal Stenosis Spinal stenosis is an abnormal narrowing of the canals of your spine (vertebrae). CAUSES  Spinal stenosis is caused by areas of bone pushing into the central canals of your vertebrae. This condition can be present at  birth (congenital). It also may be caused by arthritic deterioration of your vertebrae (spinal degeneration).  SYMPTOMS   Pain that is generally worse with activities, particularly standing and walking.  Numbness, tingling, hot or cold sensations, weakness, or weariness in your legs.  Frequent episodes of falling.  A  foot-slapping gait that leads to muscle weakness. DIAGNOSIS  Spinal stenosis is diagnosed with the use of magnetic resonance imaging (MRI) or computed tomography (CT). TREATMENT  Initial therapy for spinal stenosis focuses on the management of the pain and other symptoms associated with the condition. These therapies include:  Practicing postural changes to lessen pressure on your nerves.  Exercises to strengthen the core of your body.  Loss of excess body weight.  The use of nonsteroidal anti-inflammatory medicines to reduce swelling and inflammation in your nerves. When therapies to manage pain are not successful, surgery to treat spinal stenosis may be recommended. This surgery involves removing excess bone, which puts pressure on your nerve roots. During this surgery (laminectomy), the posterior boney arch (lamina) and excess bone around the facet joints are removed. Document Released: 07/07/2003 Document Revised: 08/31/2013 Document Reviewed: 07/25/2012 [facility] Patient Information 2015 [place], [place]. This information is not intended to replace advice given to you by your health care provider. Make sure you discuss any questions you have with your health care provider.

## 2014-11-13 LAB — VITAMIN D 25 HYDROXY (VIT D DEFICIENCY, FRACTURES): Vit D, 25-Hydroxy: 34 ng/mL (ref 30–100)

## 2014-11-25 ENCOUNTER — Ambulatory Visit: Payer: Self-pay | Admitting: Internal Medicine

## 2014-12-01 ENCOUNTER — Ambulatory Visit
Admission: RE | Admit: 2014-12-01 | Discharge: 2014-12-01 | Disposition: A | Discharge status: Home / Self Care | Payer: BLUE CROSS/BLUE SHIELD | Source: Ambulatory Visit | Attending: Physician Assistant | Admitting: Physician Assistant

## 2014-12-01 DIAGNOSIS — G43809 Other migraine, not intractable, without status migrainosus: Secondary | ICD-10-CM

## 2014-12-01 DIAGNOSIS — R471 Dysarthria and anarthria: Secondary | ICD-10-CM

## 2014-12-02 ENCOUNTER — Ambulatory Visit: Payer: Self-pay | Admitting: Internal Medicine

## 2014-12-07 ENCOUNTER — Other Ambulatory Visit: Payer: Self-pay | Admitting: Neurological Surgery

## 2014-12-07 DIAGNOSIS — M4716 Other spondylosis with myelopathy, lumbar region: Secondary | ICD-10-CM

## 2015-02-24 ENCOUNTER — Encounter: Payer: Self-pay | Admitting: Internal Medicine

## 2015-03-17 ENCOUNTER — Telehealth: Payer: Self-pay | Admitting: Gastroenterology

## 2015-03-17 ENCOUNTER — Telehealth: Payer: Self-pay | Admitting: Internal Medicine

## 2015-03-17 NOTE — Telephone Encounter (Signed)
Left message for patient to call back  

## 2015-03-17 NOTE — Telephone Encounter (Signed)
Patient called the on-call physician line this evening and I spoke with her. She is having severe LLQ abdominal pain, states she "can't eat anything" due to the pain. She thinks she may have had a fevers last night but not currently. No change in bowel habits. She has had this pain previously but declined CT scan in the past. Her symptoms are suggestive of diverticulitis and recommend we either treat her for it empirically with antibiotics tonight or she can go to the ER for CT Scan and further evaluation. She reports due to prior radiation exposure she did not want a CT scan.  Following discussion of both options, she reported either way that was not able to leave her house tonight to do anything, and would not be able to do either. I recommended if she was in that much pain she seek evaluation in the ER or at least start antibiotics for diverticulitis. I counseled her on the potential complications of diverticulitis. She is not having fever at this time.  She asked that Dr. Carlean Purl or Dr. Celesta Aver nurse call her tomorrow morning. She was very upset that she called previously this morning and had asked that she be called back on her cell phone, but she was at work all day and a message was left at her home phone, which she did not get until tonight.  I notified her I will pass this on to Dr. Carlean Purl. In the interim my recommendations remains and that if she is not feeling well and can't tolerate PO she should go to the ER, she declined.

## 2015-03-18 ENCOUNTER — Ambulatory Visit (INDEPENDENT_AMBULATORY_CARE_PROVIDER_SITE_OTHER): Payer: BLUE CROSS/BLUE SHIELD | Admitting: Internal Medicine

## 2015-03-18 ENCOUNTER — Encounter: Payer: Self-pay | Admitting: Internal Medicine

## 2015-03-18 VITALS — BP 100/72 | HR 100 | Ht 65.0 in | Wt 221.1 lb

## 2015-03-18 DIAGNOSIS — R1032 Left lower quadrant pain: Secondary | ICD-10-CM

## 2015-03-18 DIAGNOSIS — R1012 Left upper quadrant pain: Secondary | ICD-10-CM | POA: Diagnosis not present

## 2015-03-18 DIAGNOSIS — E669 Obesity, unspecified: Secondary | ICD-10-CM | POA: Diagnosis not present

## 2015-03-18 DIAGNOSIS — R6881 Early satiety: Secondary | ICD-10-CM | POA: Diagnosis not present

## 2015-03-18 MED ORDER — AMOXICILLIN-POT CLAVULANATE 875-125 MG PO TABS: 1.0000 | ORAL_TABLET | Freq: Two times a day (BID) | ORAL | Status: DC

## 2015-03-18 NOTE — Telephone Encounter (Signed)
See additional phone notes for additional details.

## 2015-03-18 NOTE — Progress Notes (Signed)
   Subjective:    Patient ID: Bridget Joyce, female    DOB: 20-Mar-1956, 59 y.o.   MRN: [id number] Cc: LLQ pain, LUQ pain  HPI Recent spell of bad LLQ pain into RLQ and some LUQ Severe last PM sxs x 2.5 d No trigger  Better today   Also having early satiety x last 6-12 months  Wt Readings from Last 3 Encounters:  03/18/15 221 lb 2 oz (100.302 kg)  11/12/14 228 lb (103.42 kg)  07/13/14 224 lb 9.6 oz (101.878 kg)    Drinks pepsi or Coke   Asking abouthow to lose weight  Medications, allergies, past medical history, past surgical history, family history and social history are reviewed and updated in the EMR.  Review of Systems As above    Objective:   Physical Exam @BP  100/72 mmHg  Pulse 100  Ht 5\' 5"  (1.651 m)  Wt 221 lb 2 oz (100.302 kg)  BMI 36.80 kg/m2@  General:  NAD Eyes:   anicteric Abdomen:  soft mildly tender LUQ/LLQ, BS+ Ext:  No cyanosis or clubbing  Data Reviewed:  Prior GI notes     Assessment & Plan:  LUQ pain  LLQ pain  Early satiety  Obesity (BMI 30-39.9)   EGD Augmentin Rx in case she has persistent sxs that might be SIBO or diverticulituis She declines CT  The risks and benefits as well as alternatives of endoscopic procedure(s) have been discussed and reviewed. All questions answered. The patient agrees to proceed.  Weight loss discussed - start by eliminating soda Gentle reintroduction of exercise good idea   I appreciate the opportunity to care for this patient.

## 2015-03-18 NOTE — Patient Instructions (Addendum)
  You have been scheduled for an endoscopy. Please follow written instructions given to you at your visit today. If you use inhalers (even only as needed), please bring them with you on the day of your procedure.    We are giving you a printed rx for augmentin to have in case you need it, if the abdominal pain persists or worsens.    I appreciate the opportunity to care for you. Silvano Rusk, MD, [facility]

## 2015-03-18 NOTE — Telephone Encounter (Signed)
I spoke with the patient this am.  She will come in today at 2:00 for eval with Dr. Carlean Purl

## 2015-05-17 ENCOUNTER — Other Ambulatory Visit: Payer: Self-pay

## 2015-05-17 DIAGNOSIS — Z1231 Encounter for screening mammogram for malignant neoplasm of breast: Secondary | ICD-10-CM

## 2015-05-20 ENCOUNTER — Ambulatory Visit: Payer: BLUE CROSS/BLUE SHIELD

## 2015-05-24 ENCOUNTER — Ambulatory Visit
Admission: RE | Admit: 2015-05-24 | Discharge: 2015-05-24 | Disposition: A | Discharge status: Home / Self Care | Payer: BLUE CROSS/BLUE SHIELD | Source: Ambulatory Visit | Attending: Neurological Surgery | Admitting: Neurological Surgery

## 2015-05-24 DIAGNOSIS — M4716 Other spondylosis with myelopathy, lumbar region: Secondary | ICD-10-CM

## 2015-05-25 ENCOUNTER — Encounter: Payer: Self-pay | Admitting: Internal Medicine

## 2015-05-26 ENCOUNTER — Encounter: Payer: BLUE CROSS/BLUE SHIELD | Admitting: Internal Medicine

## 2015-06-09 ENCOUNTER — Ambulatory Visit
Admission: RE | Admit: 2015-06-09 | Discharge: 2015-06-09 | Disposition: A | Discharge status: Home / Self Care | Payer: BLUE CROSS/BLUE SHIELD | Source: Ambulatory Visit

## 2015-06-09 DIAGNOSIS — Z1231 Encounter for screening mammogram for malignant neoplasm of breast: Secondary | ICD-10-CM

## 2015-08-01 ENCOUNTER — Encounter: Payer: Self-pay | Admitting: Internal Medicine

## 2015-09-20 ENCOUNTER — Encounter: Payer: Self-pay | Admitting: Internal Medicine

## 2015-10-18 DIAGNOSIS — J309 Allergic rhinitis, unspecified: Secondary | ICD-10-CM | POA: Diagnosis not present

## 2015-10-18 DIAGNOSIS — J4530 Mild persistent asthma, uncomplicated: Secondary | ICD-10-CM | POA: Diagnosis not present

## 2015-11-25 DIAGNOSIS — F4010 Social phobia, unspecified: Secondary | ICD-10-CM | POA: Diagnosis not present

## 2015-12-07 ENCOUNTER — Ambulatory Visit: Payer: BLUE CROSS/BLUE SHIELD | Admitting: Podiatry

## 2015-12-22 ENCOUNTER — Ambulatory Visit: Payer: BLUE CROSS/BLUE SHIELD | Admitting: Podiatry

## 2015-12-27 DIAGNOSIS — H5015 Alternating exotropia: Secondary | ICD-10-CM | POA: Diagnosis not present

## 2015-12-27 DIAGNOSIS — Z9889 Other specified postprocedural states: Secondary | ICD-10-CM | POA: Diagnosis not present

## 2016-01-04 ENCOUNTER — Encounter: Payer: Self-pay | Admitting: Internal Medicine

## 2016-01-19 ENCOUNTER — Ambulatory Visit: Payer: BLUE CROSS/BLUE SHIELD | Admitting: Podiatry

## 2016-01-20 DIAGNOSIS — M9903 Segmental and somatic dysfunction of lumbar region: Secondary | ICD-10-CM | POA: Diagnosis not present

## 2016-01-20 DIAGNOSIS — M9904 Segmental and somatic dysfunction of sacral region: Secondary | ICD-10-CM | POA: Diagnosis not present

## 2016-01-20 DIAGNOSIS — M542 Cervicalgia: Secondary | ICD-10-CM | POA: Diagnosis not present

## 2016-01-20 DIAGNOSIS — M545 Low back pain: Secondary | ICD-10-CM | POA: Diagnosis not present

## 2016-01-25 ENCOUNTER — Ambulatory Visit: Payer: BLUE CROSS/BLUE SHIELD | Admitting: Podiatry

## 2016-01-25 DIAGNOSIS — M9904 Segmental and somatic dysfunction of sacral region: Secondary | ICD-10-CM | POA: Diagnosis not present

## 2016-01-25 DIAGNOSIS — M542 Cervicalgia: Secondary | ICD-10-CM | POA: Diagnosis not present

## 2016-01-25 DIAGNOSIS — M9903 Segmental and somatic dysfunction of lumbar region: Secondary | ICD-10-CM | POA: Diagnosis not present

## 2016-01-25 DIAGNOSIS — M545 Low back pain: Secondary | ICD-10-CM | POA: Diagnosis not present

## 2016-02-08 DIAGNOSIS — M9904 Segmental and somatic dysfunction of sacral region: Secondary | ICD-10-CM | POA: Diagnosis not present

## 2016-02-08 DIAGNOSIS — M9903 Segmental and somatic dysfunction of lumbar region: Secondary | ICD-10-CM | POA: Diagnosis not present

## 2016-02-08 DIAGNOSIS — M542 Cervicalgia: Secondary | ICD-10-CM | POA: Diagnosis not present

## 2016-02-08 DIAGNOSIS — M545 Low back pain: Secondary | ICD-10-CM | POA: Diagnosis not present

## 2016-02-09 ENCOUNTER — Ambulatory Visit: Payer: BLUE CROSS/BLUE SHIELD | Admitting: Podiatry

## 2016-02-10 ENCOUNTER — Telehealth: Payer: Self-pay | Admitting: Podiatry

## 2016-02-10 NOTE — Telephone Encounter (Signed)
Called patient and left a message on work voicemail letting her know that Bridget Joyce will be mailing our office a CD with all of her X-ray Images on them. Tried calling patient at home but after it rang so many times I would get the message that says "please enter your access code."

## 2016-02-21 DIAGNOSIS — M545 Low back pain: Secondary | ICD-10-CM | POA: Diagnosis not present

## 2016-02-21 DIAGNOSIS — M9905 Segmental and somatic dysfunction of pelvic region: Secondary | ICD-10-CM | POA: Diagnosis not present

## 2016-02-21 DIAGNOSIS — M9903 Segmental and somatic dysfunction of lumbar region: Secondary | ICD-10-CM | POA: Diagnosis not present

## 2016-02-21 DIAGNOSIS — M9904 Segmental and somatic dysfunction of sacral region: Secondary | ICD-10-CM | POA: Diagnosis not present

## 2016-02-24 DIAGNOSIS — F4010 Social phobia, unspecified: Secondary | ICD-10-CM | POA: Diagnosis not present

## 2016-03-01 ENCOUNTER — Encounter: Payer: Self-pay | Admitting: Internal Medicine

## 2016-03-01 DIAGNOSIS — S76911A Strain of unspecified muscles, fascia and tendons at thigh level, right thigh, initial encounter: Secondary | ICD-10-CM | POA: Diagnosis not present

## 2016-03-07 ENCOUNTER — Ambulatory Visit (INDEPENDENT_AMBULATORY_CARE_PROVIDER_SITE_OTHER): Payer: BLUE CROSS/BLUE SHIELD | Admitting: Internal Medicine

## 2016-03-07 VITALS — BP 122/84 | HR 76 | Temp 97.9°F | Resp 16 | Ht 65.0 in | Wt 229.8 lb

## 2016-03-07 DIAGNOSIS — Z0001 Encounter for general adult medical examination with abnormal findings: Secondary | ICD-10-CM

## 2016-03-07 DIAGNOSIS — E559 Vitamin D deficiency, unspecified: Secondary | ICD-10-CM

## 2016-03-07 DIAGNOSIS — I10 Essential (primary) hypertension: Secondary | ICD-10-CM | POA: Diagnosis not present

## 2016-03-07 DIAGNOSIS — E782 Mixed hyperlipidemia: Secondary | ICD-10-CM

## 2016-03-07 DIAGNOSIS — Z1212 Encounter for screening for malignant neoplasm of rectum: Secondary | ICD-10-CM

## 2016-03-07 DIAGNOSIS — R5383 Other fatigue: Secondary | ICD-10-CM

## 2016-03-07 DIAGNOSIS — K219 Gastro-esophageal reflux disease without esophagitis: Secondary | ICD-10-CM

## 2016-03-07 DIAGNOSIS — R7303 Prediabetes: Secondary | ICD-10-CM

## 2016-03-07 DIAGNOSIS — Z136 Encounter for screening for cardiovascular disorders: Secondary | ICD-10-CM

## 2016-03-07 DIAGNOSIS — Z79899 Other long term (current) drug therapy: Secondary | ICD-10-CM | POA: Diagnosis not present

## 2016-03-07 DIAGNOSIS — R0989 Other specified symptoms and signs involving the circulatory and respiratory systems: Secondary | ICD-10-CM

## 2016-03-07 DIAGNOSIS — Z111 Encounter for screening for respiratory tuberculosis: Secondary | ICD-10-CM | POA: Diagnosis not present

## 2016-03-07 DIAGNOSIS — Z0000 Encounter for general adult medical examination without abnormal findings: Secondary | ICD-10-CM

## 2016-03-07 LAB — LIPID PANEL
Cholesterol: 206 mg/dL — ABNORMAL HIGH (ref <200)
HDL: 71 mg/dL (ref >50)
LDL Cholesterol: 119 mg/dL — ABNORMAL HIGH
Total CHOL/HDL Ratio: 2.9 Ratio (ref <5.0)
Triglycerides: 82 mg/dL (ref <150)
VLDL: 16 mg/dL (ref <30)

## 2016-03-07 LAB — CBC WITH DIFFERENTIAL/PLATELET
Basophils Absolute: 0 cells/uL (ref 0–200)
Basophils Relative: 0 %
Eosinophils Absolute: 148 cells/uL (ref 15–500)
Eosinophils Relative: 2 %
HCT: 40.1 % (ref 35.0–45.0)
Hemoglobin: 13.5 g/dL (ref 11.7–15.5)
Lymphocytes Relative: 29 %
Lymphs Abs: 2146 cells/uL (ref 850–3900)
MCH: 31.3 pg (ref 27.0–33.0)
MCHC: 33.7 g/dL (ref 32.0–36.0)
MCV: 92.8 fL (ref 80.0–100.0)
MPV: 10.6 fL (ref 7.5–12.5)
Monocytes Absolute: 518 cells/uL (ref 200–950)
Monocytes Relative: 7 %
Neutro Abs: 4588 cells/uL (ref 1500–7800)
Neutrophils Relative %: 62 %
Platelets: 351 10*3/uL (ref 140–400)
RBC: 4.32 MIL/uL (ref 3.80–5.10)
RDW: 13.5 % (ref 11.0–15.0)
WBC: 7.4 10*3/uL (ref 3.8–10.8)

## 2016-03-07 LAB — BASIC METABOLIC PANEL WITH GFR
BUN: 11 mg/dL (ref 7–25)
CO2: 29 mmol/L (ref 20–31)
Calcium: 9.5 mg/dL (ref 8.6–10.4)
Chloride: 101 mmol/L (ref 98–110)
Creat: 0.97 mg/dL (ref 0.50–1.05)
GFR, Est African American: 74 mL/min (ref >=60)
GFR, Est Non African American: 64 mL/min (ref >=60)
Glucose, Bld: 80 mg/dL (ref 65–99)
Potassium: 3.9 mmol/L (ref 3.5–5.3)
Sodium: 141 mmol/L (ref 135–146)

## 2016-03-07 LAB — HEPATIC FUNCTION PANEL
ALT: 26 U/L (ref 6–29)
AST: 24 U/L (ref 10–35)
Albumin: 4.6 g/dL (ref 3.6–5.1)
Alkaline Phosphatase: 97 U/L (ref 33–130)
Bilirubin, Direct: 0.1 mg/dL (ref <=0.2)
Indirect Bilirubin: 0.4 mg/dL (ref 0.2–1.2)
Total Bilirubin: 0.5 mg/dL (ref 0.2–1.2)
Total Protein: 7.2 g/dL (ref 6.1–8.1)

## 2016-03-07 LAB — IRON AND TIBC
%SAT: 24 % (ref 11–50)
Iron: 76 ug/dL (ref 45–160)
TIBC: 322 ug/dL (ref 250–450)
UIBC: 246 ug/dL (ref 125–400)

## 2016-03-07 LAB — MAGNESIUM: Magnesium: 1.8 mg/dL (ref 1.5–2.5)

## 2016-03-07 LAB — TSH: TSH: 2.65 mIU/L

## 2016-03-07 NOTE — Patient Instructions (Signed)

## 2016-03-07 NOTE — Progress Notes (Signed)
[facility] Bridget Joyce, M.D.    Bridget Joyce, P.A.-C      Starlyn Skeans, P.A.-C  [facility]                [address] [place] [id number] Telephone [phone] Telefax [phone]  Annual Screening/Preventative Visit & Comprehensive Evaluation &  Examination     This very nice 60 y.o.  SWF presents for a Screening/Preventative Visit & comprehensive evaluation and management of multiple medical co-morbidities.  Patient has been followed for labile HTN, Prediabetes, Hyperlipidemia and Vitamin D Deficiency.      Patient has been follow expectantly for for years for labile HTN. Patient's BP has been controlled at home and patient denies any cardiac symptoms as chest pain, palpitations, shortness of breath, dizziness or ankle swelling. Today's BP is 122/84      Patient's hyperlipidemia is controlled with diet and medications. Patient denies myalgias or other medication SE's. Last lipids were not at goal: Lab Results  Component Value Date   CHOL 189 11/12/2014   HDL 63 11/12/2014   LDLCALC 109 (H) 11/12/2014   TRIG 83 11/12/2014   CHOLHDL 3.0 11/12/2014      Patient has Morbid Obesity (BMI 38+) and consequent prediabetes predating circa 2011 with A1c 6.5% and since A1c's have averaged about 6.1%.  Patient denies reactive hypoglycemic symptoms, visual blurring, diabetic polys, or paresthesias. Last A1c was still not at goal: Lab Results  Component Value Date   HGBA1C 6.1 (H) 11/12/2014      Finally, patient has history of Vitamin D Deficiency in 2008 of "21" and last Vitamin D was still very low: Lab Results  Component Value Date   VD25OH 34 11/12/2014   Current Outpatient Prescriptions on File Prior to Visit  Medication Sig  . ALPRAZolam  0.5 MG tablet prn  . VITAMIN C Take by mouth daily.  . BUMETANIDE  Take 1 tablet by mouth daily as needed. For fluid.  . BUSPAR 10 MG  tablet Take 10 mg by mouth daily.   Marland Kitchen QVAR 40 MCG/ACT inhaler INHALE 2 PUFFS INTO THE LUNGS TWICE DAILY   . CALCIUM CITRATE Take by mouth daily.  Marland Kitchen VITAMIN D 2000 UNITS Take 1 capsule by mouth daily.  . magnesium 30 MG tablet Take 30 mg by mouth daily.   . MULTI-VIT w/ MINERALS Take 1 tablet by mouth daily.  . Omega-3 Fatty Acids (FISH OIL PO) Take by mouth daily.   Allergies  Allergen Reactions  . Gadolinium Derivatives Itching   Past Medical History:  Diagnosis Date  . Allergy    rag weed, pollen,dust  . Anxiety   . Asthma   . Bursitis of hip   . Diverticulosis   . External hemorrhoids   . Fibroid   . Fibromyalgia   . GERD (gastroesophageal reflux disease)   . Labile hypertension   . Obesity   . Peripheral edema   . Prediabetes   . Vitamin D deficiency    Health Maintenance  Topic Date Due  . Hepatitis C Screening  11-30-1955  . HIV Screening  06/30/1971  . INFLUENZA VACCINE  11/29/2015  . MAMMOGRAM  06/08/2017  . PAP SMEAR  07/04/2017  . COLONOSCOPY  01/07/2022  . TETANUS/TDAP  02/18/2023   Immunization History  Administered Date(s) Administered  . Influenza Split 01/28/2013  . Influenza-Unspecified 04/22/2015, 02/20/2016  .  PPD Test 07/13/2014  . Td 01/14/2003  . Tdap 02/17/2013   Past Surgical History:  Procedure Laterality Date  . ABDOMINAL HYSTERECTOMY  2002  . COLONOSCOPY  09/02/2006   external hemorrhoids,diverticulosis  . KNEE SURGERY     Left  . LIPOMA EXCISION     Labial  . MANDIBLE SURGERY    . MOUTH SURGERY    . REPAIR ANKLE LIGAMENT  04-05-2014   x2  . TONSILLECTOMY     Family History  Problem Relation Age of Onset  . Hypertension Father   . Heart disease Father   . Diabetes Father   . Heart failure Father   . Uterine cancer Sister   . Diabetes Sister   . Colon cancer Neg Hx   . Esophageal cancer Neg Hx   . Stomach cancer Neg Hx   . Rectal cancer Neg Hx    Social History  Substance Use Topics  . Smoking status: Never Smoker  .  Smokeless tobacco: Never Used  . Alcohol use No    ROS Constitutional: Denies fever, chills, weight loss/gain, headaches, insomnia,  night sweats, and change in appetite. Does c/o fatigue. Eyes: Denies redness, blurred vision, diplopia, discharge, itchy, watery eyes.  ENT: Denies discharge, congestion, post nasal drip, epistaxis, sore throat, earache, hearing loss, dental pain, Tinnitus, Vertigo, Sinus pain, snoring.  Cardio: Denies chest pain, palpitations, irregular heartbeat, syncope, dyspnea, diaphoresis, orthopnea, PND, claudication, edema Respiratory: denies cough, dyspnea, DOE, pleurisy, hoarseness, laryngitis, wheezing.  Gastrointestinal: Denies dysphagia, heartburn, reflux, water brash, pain, cramps, nausea, vomiting, bloating, diarrhea, constipation, hematemesis, melena, hematochezia, jaundice, hemorrhoids Genitourinary: Denies dysuria, frequency, urgency, nocturia, hesitancy, discharge, hematuria, flank pain Breast: Breast lumps, nipple discharge, bleeding.  Musculoskeletal: Denies arthralgia, myalgia, stiffness, Jt. Swelling, pain, limp, and strain/sprain. Denies falls. Skin: Denies puritis, rash, hives, warts, acne, eczema, changing in skin lesion Neuro: No weakness, tremor, incoordination, spasms, paresthesia, pain Psychiatric: Denies confusion, memory loss, sensory loss. Denies Depression. Endocrine: Denies change in weight, skin, hair change, nocturia, and paresthesia, diabetic polys, visual blurring, hyper / hypo glycemic episodes.  Heme/Lymph: No excessive bleeding, bruising, enlarged lymph nodes.  Physical Exam  BP 122/84   Pulse 76   Temp 97.9 F (36.6 C)   Resp 16   Ht 5\' 5"  (1.651 m)   Wt 229 lb 12.8 oz (104.2 kg)   BMI 38.24 kg/m   General Appearance: Over nourished with central obesity and in no apparent distress.  Eyes: PERRLA, EOMs, conjunctiva no swelling or erythema, normal fundi and vessels. Sinuses: No frontal/maxillary tenderness ENT/Mouth: EACs  patent / TMs  nl. Nares clear without erythema, swelling, mucoid exudates. Oral hygiene is good. No erythema, swelling, or exudate. Tongue normal, non-obstructing. Tonsils not swollen or erythematous. Hearing normal.  Neck: Supple, thyroid normal. No bruits, nodes or JVD. Respiratory: Respiratory effort normal.  BS equal and clear bilateral without rales, rhonci, wheezing or stridor. Cardio: Heart sounds are normal with regular rate and rhythm and no murmurs, rubs or gallops. Peripheral pulses are normal and equal bilaterally without edema. No aortic or femoral bruits. Chest: symmetric with normal excursions and percussion. Breasts: Symmetric, without lumps, nipple discharge, retractions, or fibrocystic changes.  Abdomen: Flat, soft with bowel sounds active. Nontender, no guarding, rebound, hernias, masses, or organomegaly.  Lymphatics: Non tender without lymphadenopathy.  Musculoskeletal: Full ROM all peripheral extremities, joint stability, 5/5 strength, and normal gait. Skin: Warm and dry without rashes, lesions, cyanosis, clubbing or  ecchymosis.  Neuro: Cranial nerves intact, reflexes equal bilaterally. Normal  muscle tone, no cerebellar symptoms. Sensation intact.  Pysch: Alert and oriented X 3, normal affect, Insight and Judgment appropriate.   Assessment and Plan  1. Annual Preventative Screening Examination  - Microalbumin / creatinine urine ratio - EKG 12-Lead - [place], RETROPERITNL ABD,  LTD - POC Hemoccult Bld/Stl  - Urinalysis, Routine w reflex microscopic  - Vitamin B12 - Iron and TIBC - CBC with Differential/Platelet - BASIC METABOLIC PANEL WITH GFR - Hepatic function panel - Magnesium - Lipid panel - TSH - Hemoglobin A1c - Insulin, random - VITAMIN D 25 Hydroxy   2. Labile hypertension  - Microalbumin / creatinine urine ratio - EKG 12-Lead - [place], RETROPERITNL ABD,  LTD - Lipid panel - TSH  3. Mixed hyperlipidemia  - EKG 12-Lead - [place], RETROPERITNL ABD,  LTD -  TSH  4. Prediabetes  - EKG 12-Lead - [place], RETROPERITNL ABD,  LTD - Hemoglobin A1c  5. Vitamin D deficiency  - VITAMIN D 25 Hydroxy  6. Gastroesophageal reflux disease   7. Screening for rectal cancer  - POC Hemoccult Bld/Stl   8. Screening examination for pulmonary tuberculosis  - PPD  9. Screening for ischemic heart disease  - EKG 12-Lead  10. Screening for AAA (aortic abdominal aneurysm)  - [place], RETROPERITNL ABD,  LTD  11. Fatigue, unspecified type  - Vitamin B12 - Iron and TIBC - CBC with Differential/Platelet  12. Medication management  - Urinalysis, Routine w reflex microscopic  - CBC with Differential/Platelet - BASIC METABOLIC PANEL WITH GFR - Hepatic function panel - Magnesium      Continue prudent diet as discussed, weight control, BP monitoring, regular exercise, and medications. Discussed med's effects and SE's. Screening labs and tests as requested with regular follow-up as recommended. Over 40 minutes of exam, counseling, chart review and high complex critical decision making was performed.

## 2016-03-08 ENCOUNTER — Ambulatory Visit: Payer: BLUE CROSS/BLUE SHIELD | Admitting: Podiatry

## 2016-03-08 LAB — VITAMIN D 25 HYDROXY (VIT D DEFICIENCY, FRACTURES): Vit D, 25-Hydroxy: 43 ng/mL (ref 30–100)

## 2016-03-08 LAB — INSULIN, RANDOM: Insulin: 15.5 u[IU]/mL (ref 2.0–19.6)

## 2016-03-08 LAB — URINALYSIS, ROUTINE W REFLEX MICROSCOPIC
Bilirubin Urine: NEGATIVE
Glucose, UA: NEGATIVE
Hgb urine dipstick: NEGATIVE
Ketones, ur: NEGATIVE
Leukocytes, UA: NEGATIVE
Nitrite: NEGATIVE
Protein, ur: NEGATIVE
Specific Gravity, Urine: 1.008 (ref 1.001–1.035)
pH: 6 (ref 5.0–8.0)

## 2016-03-08 LAB — MICROALBUMIN / CREATININE URINE RATIO
Creatinine, Urine: 23 mg/dL (ref 20–320)
Microalb, Ur: <0.2 mg/dL

## 2016-03-08 LAB — HEMOGLOBIN A1C
Hgb A1c MFr Bld: 5.8 % — ABNORMAL HIGH (ref <5.7)
Mean Plasma Glucose: 120 mg/dL

## 2016-03-08 LAB — VITAMIN B12: Vitamin B-12: 482 pg/mL (ref 200–1100)

## 2016-03-09 LAB — TB SKIN TEST
Induration: 0 mm
TB Skin Test: NEGATIVE

## 2016-03-10 ENCOUNTER — Encounter: Payer: Self-pay | Admitting: Internal Medicine

## 2016-03-30 DIAGNOSIS — M9905 Segmental and somatic dysfunction of pelvic region: Secondary | ICD-10-CM | POA: Diagnosis not present

## 2016-03-30 DIAGNOSIS — M9904 Segmental and somatic dysfunction of sacral region: Secondary | ICD-10-CM | POA: Diagnosis not present

## 2016-03-30 DIAGNOSIS — M545 Low back pain: Secondary | ICD-10-CM | POA: Diagnosis not present

## 2016-03-30 DIAGNOSIS — M9903 Segmental and somatic dysfunction of lumbar region: Secondary | ICD-10-CM | POA: Diagnosis not present

## 2016-04-07 IMAGING — MR MR HEAD WO/W CM
8 of 12 series · 29 of 48 positions shown · IV contrast (multihance)
Comparison: Head CT 08/25/2004

CLINICAL DATA: Migraine headache with visual disturbance and
confusion. Intermittent incidence since [REDACTED]. Migraine
with Dessy, confusion, dysarthria, hearing loss and tinnitus.

EXAM:
MRI HEAD WITHOUT AND WITH CONTRAST
TECHNIQUE: Multiplanar, multiecho pulse sequences of the brain and surrounding
structures were obtained without and with intravenous contrast.
CONTRAST:  20 cc MultiHance

[Series 2: T1 · sagittal · 5.0mm · 0.45mm/px · 3 of 19 slices shown]
[im 1/19]
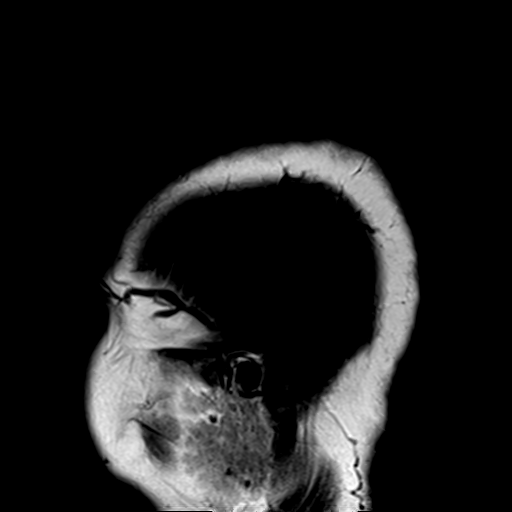
[im 10/19]
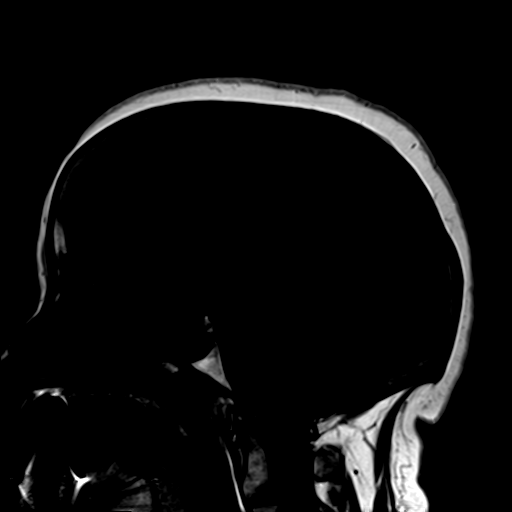
[im 19/19]
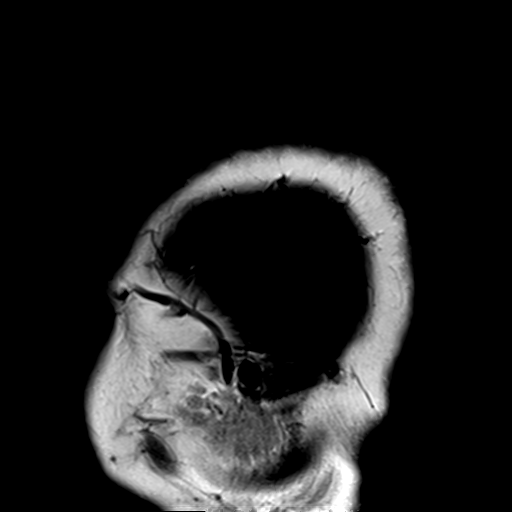

[Series 3: DWI · axial · 3.0mm · 1.80mm/px · z∈[-70,+76]mm · 9 of 100 slices shown (1 of 2)]
[im 1/100]
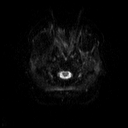
[im 19/100]
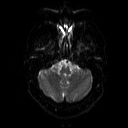
[im 28/100]
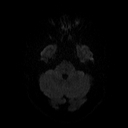
[im 46/100]
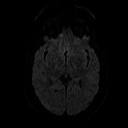
[im 55/100]
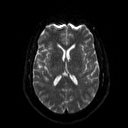
[im 73/100]
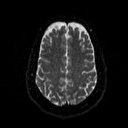
[im 82/100]
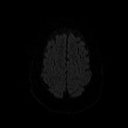
[im 91/100]
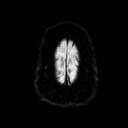
[im 100/100]
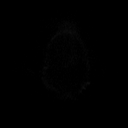

[Series 4: DWI · axial · 3.0mm · 1.80mm/px · z∈[-70,+76]mm · 6 of 50 slices shown (2 of 2)]
[im 1/50]
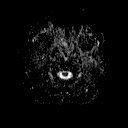
[im 10/50]
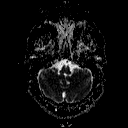
[im 20/50]
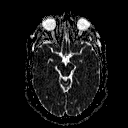
[im 30/50]
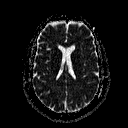
[im 40/50]
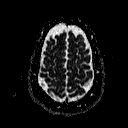
[im 50/50]
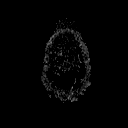

[Series 5: T2 · axial · 5.0mm · 0.36mm/px · z∈[-72,+75]mm · 3 of 22 slices shown]
[im 1/22]
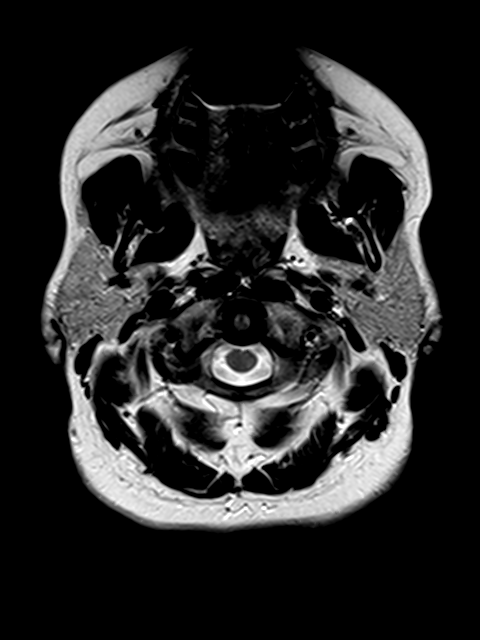
[im 11/22]
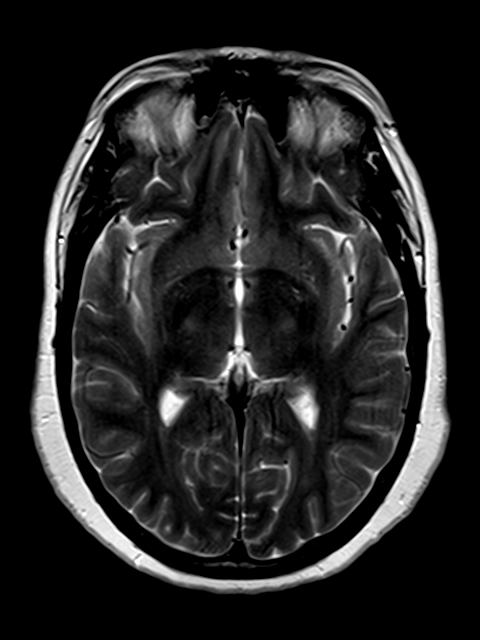
[im 22/22]
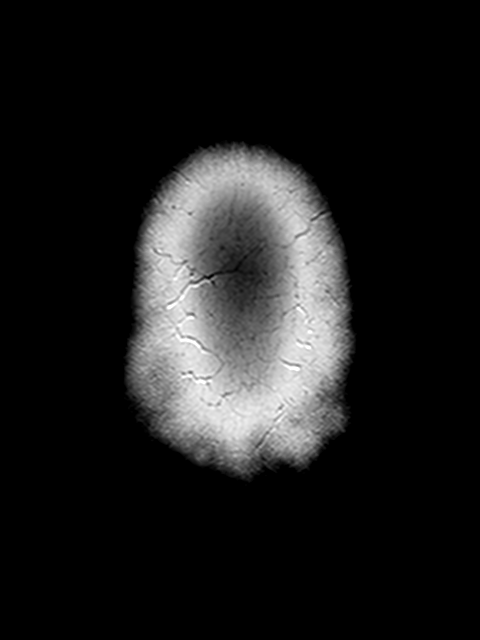

[Series 6: FLAIR · axial · 5.0mm · 0.45mm/px · z∈[-72,+75]mm · 3 of 22 slices shown (1 of 2)]
[im 1/22]
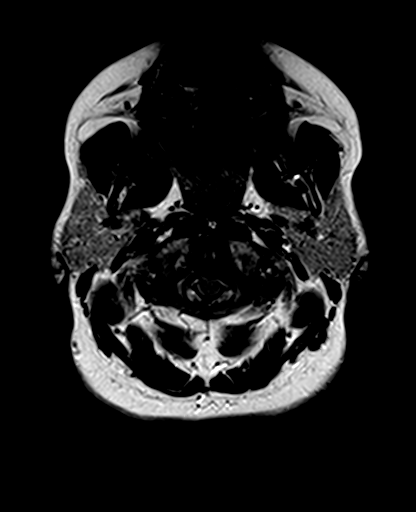
[im 11/22]
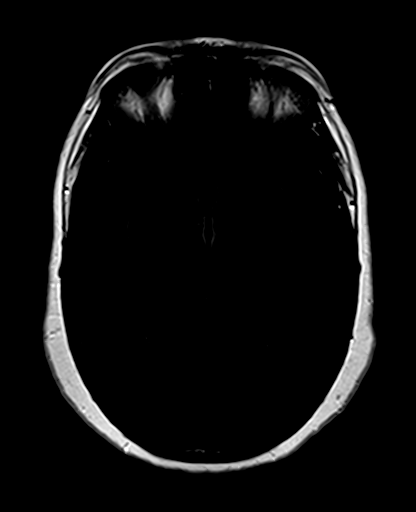
[im 22/22]
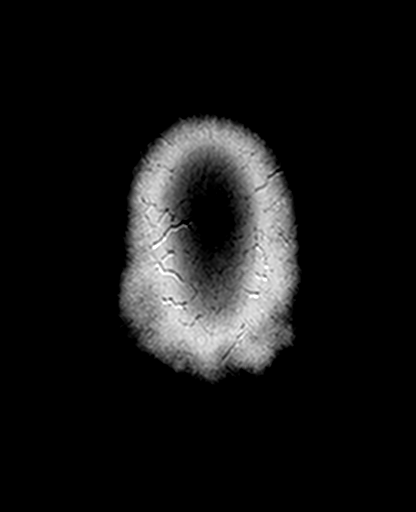

[Series 7: cor 3mm · coronal · 3.0mm · 0.35mm/px · 1 of 11 slices shown]
[im 1/11]
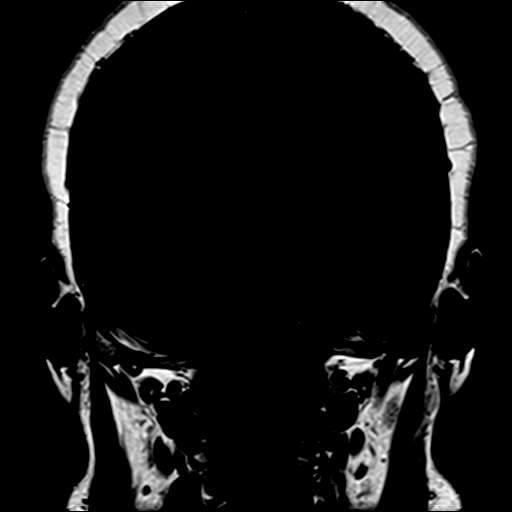

[Series 8: FLAIR · sagittal · 5.0mm · 0.45mm/px · 2 of 19 slices shown (2 of 2)]
[im 1/19]
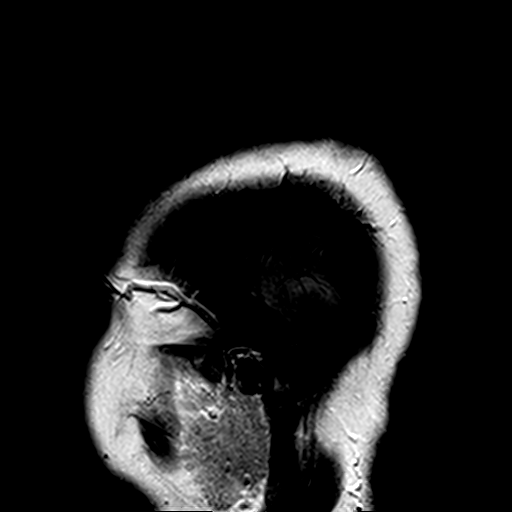
[im 19/19]
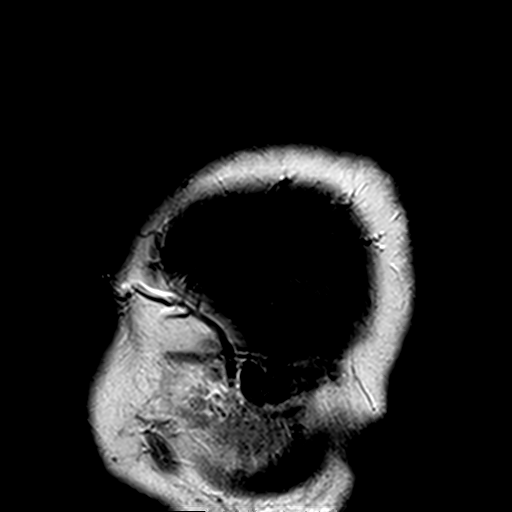

[Series 9: bSSFP · axial · 1.0mm · 0.23mm/px · z∈[-55,-46]mm · 2 of 40 slices shown]
[im 1/40]
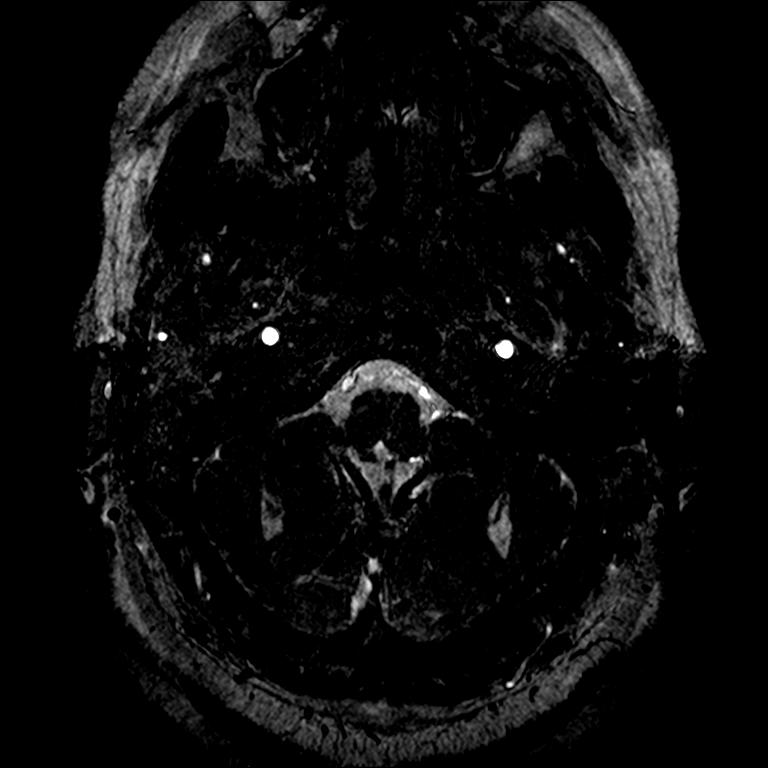
[im 10/40]
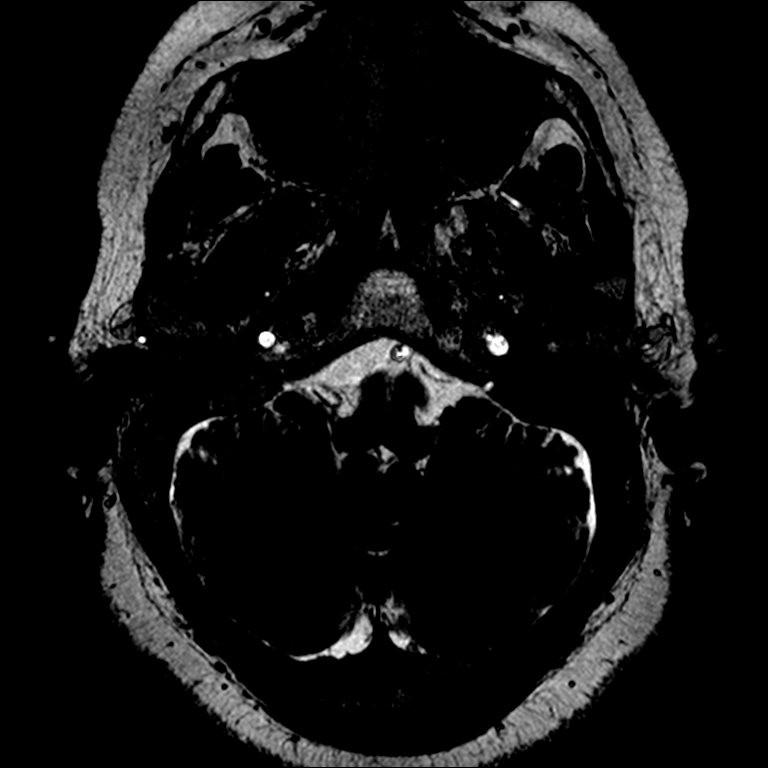

[29 of 48 positions shown; findings below may reference images not displayed]

FINDINGS: The brain has a normal appearance on all pulse sequences without
evidence of malformation, atrophy, old or acute infarction, mass
lesion, hemorrhage, hydrocephalus or extra-axial collection. No
pituitary mass. No fluid in the sinuses, middle ears or mastoids. No
skull or skullbase lesion. There is flow in the major vessels at the
base of the brain. Major venous sinuses show flow. No vestibular
schwannoma or other CP angle lesion. No abnormal contrast
enhancement occurs.
IMPRESSION: Normal examination. No cause of the presenting symptoms is
identified.

## 2016-04-10 DIAGNOSIS — S76911A Strain of unspecified muscles, fascia and tendons at thigh level, right thigh, initial encounter: Secondary | ICD-10-CM | POA: Diagnosis not present

## 2016-04-11 DIAGNOSIS — M545 Low back pain: Secondary | ICD-10-CM | POA: Diagnosis not present

## 2016-04-11 DIAGNOSIS — M9903 Segmental and somatic dysfunction of lumbar region: Secondary | ICD-10-CM | POA: Diagnosis not present

## 2016-04-11 DIAGNOSIS — M9904 Segmental and somatic dysfunction of sacral region: Secondary | ICD-10-CM | POA: Diagnosis not present

## 2016-04-11 DIAGNOSIS — M9905 Segmental and somatic dysfunction of pelvic region: Secondary | ICD-10-CM | POA: Diagnosis not present

## 2016-04-12 ENCOUNTER — Ambulatory Visit: Payer: BLUE CROSS/BLUE SHIELD | Admitting: Podiatry

## 2016-04-16 DIAGNOSIS — S76911A Strain of unspecified muscles, fascia and tendons at thigh level, right thigh, initial encounter: Secondary | ICD-10-CM | POA: Diagnosis not present

## 2016-04-18 DIAGNOSIS — L821 Other seborrheic keratosis: Secondary | ICD-10-CM | POA: Diagnosis not present

## 2016-04-18 DIAGNOSIS — D1801 Hemangioma of skin and subcutaneous tissue: Secondary | ICD-10-CM | POA: Diagnosis not present

## 2016-04-18 DIAGNOSIS — D692 Other nonthrombocytopenic purpura: Secondary | ICD-10-CM | POA: Diagnosis not present

## 2016-04-18 DIAGNOSIS — L438 Other lichen planus: Secondary | ICD-10-CM | POA: Diagnosis not present

## 2016-04-19 ENCOUNTER — Other Ambulatory Visit: Payer: Self-pay | Admitting: Internal Medicine

## 2016-04-19 DIAGNOSIS — M9905 Segmental and somatic dysfunction of pelvic region: Secondary | ICD-10-CM | POA: Diagnosis not present

## 2016-04-19 DIAGNOSIS — M9904 Segmental and somatic dysfunction of sacral region: Secondary | ICD-10-CM | POA: Diagnosis not present

## 2016-04-19 DIAGNOSIS — M9903 Segmental and somatic dysfunction of lumbar region: Secondary | ICD-10-CM | POA: Diagnosis not present

## 2016-04-19 DIAGNOSIS — M545 Low back pain: Secondary | ICD-10-CM | POA: Diagnosis not present

## 2016-04-20 DIAGNOSIS — S76911A Strain of unspecified muscles, fascia and tendons at thigh level, right thigh, initial encounter: Secondary | ICD-10-CM | POA: Diagnosis not present

## 2016-04-26 DIAGNOSIS — M9905 Segmental and somatic dysfunction of pelvic region: Secondary | ICD-10-CM | POA: Diagnosis not present

## 2016-04-26 DIAGNOSIS — M9903 Segmental and somatic dysfunction of lumbar region: Secondary | ICD-10-CM | POA: Diagnosis not present

## 2016-04-26 DIAGNOSIS — M545 Low back pain: Secondary | ICD-10-CM | POA: Diagnosis not present

## 2016-04-26 DIAGNOSIS — M9904 Segmental and somatic dysfunction of sacral region: Secondary | ICD-10-CM | POA: Diagnosis not present

## 2016-04-27 ENCOUNTER — Encounter: Payer: Self-pay | Admitting: Podiatry

## 2016-04-27 ENCOUNTER — Ambulatory Visit (INDEPENDENT_AMBULATORY_CARE_PROVIDER_SITE_OTHER): Payer: BLUE CROSS/BLUE SHIELD | Admitting: Podiatry

## 2016-04-27 ENCOUNTER — Ambulatory Visit (INDEPENDENT_AMBULATORY_CARE_PROVIDER_SITE_OTHER): Payer: BLUE CROSS/BLUE SHIELD

## 2016-04-27 VITALS — BP 106/67 | HR 62 | Resp 16 | Ht 65.0 in | Wt 215.0 lb

## 2016-04-27 DIAGNOSIS — L600 Ingrowing nail: Secondary | ICD-10-CM | POA: Diagnosis not present

## 2016-04-27 DIAGNOSIS — M79671 Pain in right foot: Secondary | ICD-10-CM | POA: Diagnosis not present

## 2016-04-27 DIAGNOSIS — M779 Enthesopathy, unspecified: Secondary | ICD-10-CM | POA: Diagnosis not present

## 2016-04-27 NOTE — Progress Notes (Signed)
Subjective:     Patient ID: Bridget Joyce, female   DOB: 1955/06/03, 60 y.o.   MRN: [id number]  HPI patient presents concerned because she's had a spur diagnosed on the top of her right foot and while the pain that was associated with it has reduced she still concerned and also complains of a periodic ingrown toenail right   Review of Systems  All other systems reviewed and are negative.      Objective:   Physical Exam  Constitutional: She is oriented to person, place, and time.  Cardiovascular: Intact distal pulses.   Musculoskeletal: Normal range of motion.  Neurological: She is oriented to person, place, and time.  Skin: Skin is warm.  Nursing note and vitals reviewed.  neurovascular status intact muscle strength adequate range of motion within normal limits with minimal discomfort dorsal first metatarsocuneiform cuneiform navicular with incurvation of the lateral border right hallux localized in nature with no distal redness or drainage.     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 I do not recommend current treatment due to reduce symptoms but if it does flareup we will need to consider cortisone type injection and ultimately she could require fusion if symptoms were to get worse

## 2016-04-27 NOTE — Progress Notes (Signed)
   Subjective:    Patient ID: ARLYNE EHLER, female    DOB: [birth year], 60 y.o.   MRN: [id number]  HPI Chief Complaint  Patient presents with  . Foot Pain    Right foot; dorsal; pt stated, "Was told had bone spurs from Orthopedic dr.; wants Dr. Paulla Dolly to look over x-rays brought and have read; and has questions regarding bone spurs"      Review of Systems  All other systems reviewed and are negative.      Objective:   Physical Exam        Assessment & Plan:

## 2016-05-10 DIAGNOSIS — M1711 Unilateral primary osteoarthritis, right knee: Secondary | ICD-10-CM | POA: Diagnosis not present

## 2016-05-11 ENCOUNTER — Telehealth: Payer: Self-pay | Admitting: *Deleted

## 2016-05-11 MED ORDER — AMOXICILLIN 250 MG PO CAPS: 250.0000 mg | ORAL_CAPSULE | Freq: Three times a day (TID) | ORAL | 0 refills | Status: DC

## 2016-05-11 NOTE — Telephone Encounter (Signed)
Patient called and complained of sinus drainage with sneezing and coughing.  Per Dr Melford Aase, an RX for Amoxicillin was sent in to [facility].  Patient will continue to take Mucinex DM and when she completes that, Dr Melford Aase recommends Dayquil and Nyquil.

## 2016-05-30 ENCOUNTER — Other Ambulatory Visit: Payer: Self-pay | Admitting: Orthopedic Surgery

## 2016-05-30 DIAGNOSIS — M1711 Unilateral primary osteoarthritis, right knee: Secondary | ICD-10-CM

## 2016-06-02 ENCOUNTER — Ambulatory Visit
Admission: RE | Admit: 2016-06-02 | Discharge: 2016-06-02 | Disposition: A | Discharge status: Home / Self Care | Payer: BLUE CROSS/BLUE SHIELD | Source: Ambulatory Visit | Attending: Orthopedic Surgery | Admitting: Orthopedic Surgery

## 2016-06-02 DIAGNOSIS — M1711 Unilateral primary osteoarthritis, right knee: Secondary | ICD-10-CM

## 2016-06-02 DIAGNOSIS — S83271A Complex tear of lateral meniscus, current injury, right knee, initial encounter: Secondary | ICD-10-CM | POA: Diagnosis not present

## 2016-06-05 ENCOUNTER — Other Ambulatory Visit: Payer: BLUE CROSS/BLUE SHIELD

## 2016-06-07 DIAGNOSIS — M1711 Unilateral primary osteoarthritis, right knee: Secondary | ICD-10-CM | POA: Diagnosis not present

## 2016-06-11 DIAGNOSIS — F4010 Social phobia, unspecified: Secondary | ICD-10-CM | POA: Diagnosis not present

## 2016-06-20 DIAGNOSIS — M9903 Segmental and somatic dysfunction of lumbar region: Secondary | ICD-10-CM | POA: Diagnosis not present

## 2016-06-20 DIAGNOSIS — M542 Cervicalgia: Secondary | ICD-10-CM | POA: Diagnosis not present

## 2016-06-20 DIAGNOSIS — M5416 Radiculopathy, lumbar region: Secondary | ICD-10-CM | POA: Diagnosis not present

## 2016-06-20 DIAGNOSIS — M9901 Segmental and somatic dysfunction of cervical region: Secondary | ICD-10-CM | POA: Diagnosis not present

## 2016-06-26 DIAGNOSIS — M9901 Segmental and somatic dysfunction of cervical region: Secondary | ICD-10-CM | POA: Diagnosis not present

## 2016-06-26 DIAGNOSIS — M9903 Segmental and somatic dysfunction of lumbar region: Secondary | ICD-10-CM | POA: Diagnosis not present

## 2016-06-26 DIAGNOSIS — M542 Cervicalgia: Secondary | ICD-10-CM | POA: Diagnosis not present

## 2016-06-26 DIAGNOSIS — M5416 Radiculopathy, lumbar region: Secondary | ICD-10-CM | POA: Diagnosis not present

## 2016-06-28 DIAGNOSIS — M5416 Radiculopathy, lumbar region: Secondary | ICD-10-CM | POA: Diagnosis not present

## 2016-06-28 DIAGNOSIS — M542 Cervicalgia: Secondary | ICD-10-CM | POA: Diagnosis not present

## 2016-06-28 DIAGNOSIS — M9901 Segmental and somatic dysfunction of cervical region: Secondary | ICD-10-CM | POA: Diagnosis not present

## 2016-06-28 DIAGNOSIS — M9903 Segmental and somatic dysfunction of lumbar region: Secondary | ICD-10-CM | POA: Diagnosis not present

## 2016-07-04 DIAGNOSIS — M542 Cervicalgia: Secondary | ICD-10-CM | POA: Diagnosis not present

## 2016-07-04 DIAGNOSIS — M9903 Segmental and somatic dysfunction of lumbar region: Secondary | ICD-10-CM | POA: Diagnosis not present

## 2016-07-04 DIAGNOSIS — M5416 Radiculopathy, lumbar region: Secondary | ICD-10-CM | POA: Diagnosis not present

## 2016-07-04 DIAGNOSIS — M9901 Segmental and somatic dysfunction of cervical region: Secondary | ICD-10-CM | POA: Diagnosis not present

## 2016-07-06 ENCOUNTER — Encounter: Payer: Self-pay | Admitting: Physician Assistant

## 2016-07-06 ENCOUNTER — Ambulatory Visit (INDEPENDENT_AMBULATORY_CARE_PROVIDER_SITE_OTHER): Payer: BLUE CROSS/BLUE SHIELD | Admitting: Physician Assistant

## 2016-07-06 DIAGNOSIS — R5383 Other fatigue: Secondary | ICD-10-CM

## 2016-07-06 NOTE — Progress Notes (Signed)
Subjective:    Patient ID: Bridget Joyce, female    DOB: 08-16-1955, 61 y.o.   MRN: [id number]  Insomnia  Primary symptoms: sleep disturbance, no difficulty falling asleep, frequent awakening, premature morning awakening, malaise/fatigue.  The symptoms are aggravated by caffeine and anxiety (sleeps alone, no alcohol, does not watch TV at night,  cool dark room. ). How many beverages per day that contain caffeine: 2-3.  Types of beverages you drink: soda (7 pm is the latest). Past treatments include nothing (does not exercise). Sleeping: 1130 pm, wakes up at 730am.  How long after going to bed to you fall asleep: less than 15 minutes (2-5 mins).   Prior diagnostic workup includes:  No prior workup.   61 y.o. obese WF presents for sleep issues.  She has had sleep issues for nearly a lifetime, but has been worse last 10 years. States no trouble initiating sleep, she will have nocturia x 1-2 x a night, occ will not fall back asleep if she wakes up early morning hours. She does not feel rested, states she has a hard time getting out of bed, takes about an hour to start moving and feel better. Denies daytime fatigue. No morning headache, occ dry mouth/dried saliva on side of mouth. Has heard herself snore and has very rarely will wake self up with "snort". Brother and sister with sleep apnea.  She also complains of right anterior hip pain x 2 weeks with walking. Has not done any meds for it.   Patient's neck circumference is 14 inchs. BMI is Estimated body mass index is 38.87 kg/m as calculated from the following:   Height as of this encounter: 5\' 5"  (1.651 m).   Weight as of this encounter: 233 lb 9.6 oz (106 kg).  EPWORTH SLEEP SCALE How Sleepy Are You? How likely are you to doze off or fall asleep in the following situations?  For each situation, decide whether or not you would have:     . No chance of dozing  =0  . Slight chance of dozing  =1  . Moderate chance of dozing =2  . High chance  of dozing  =3      Situation    1) Chance of Dozing Sitting and reading      1 2) Watching TV   1 3) Sitting inactive in a public place (e.g., a theater or a meeting)  0 4) As a passenger in a car for an hour without a break  0 5)Lying down to rest in the afternoon when circumstances permit  2 6) Sitting and talking to someone 0 7) Sitting quietly after a lunch without alcohol  1 8) In a car, while stopped for a few minutes in traffic 0   Total Score =      ________5 __________   Blood pressure 126/70, pulse 75, temperature 97.7 F (36.5 C), resp. rate 14, height 5\' 5"  (1.651 m), weight 233 lb 9.6 oz (106 kg), SpO2 97 %.   Review of Systems  Constitutional: Positive for fatigue and malaise/fatigue. Negative for activity change, appetite change, chills, diaphoresis, fever and unexpected weight change.  HENT: Negative.   Eyes: Negative.   Respiratory: Negative.   Cardiovascular: Positive for leg swelling (occ). Negative for chest pain and palpitations.  Gastrointestinal: Negative.   Genitourinary: Negative.   Musculoskeletal: Negative.   Neurological: Negative.   Hematological: Negative.   Psychiatric/Behavioral: Positive for sleep disturbance. Negative for agitation, behavioral problems, confusion, decreased concentration, dysphoric  mood, hallucinations, self-injury and suicidal ideas. The patient is nervous/anxious and has insomnia. The patient is not hyperactive.        Objective:   Physical Exam  Constitutional: She is oriented to person, place, and time. She appears well-developed and well-nourished.  obese  HENT:  Head: Normocephalic and atraumatic.  Right Ear: External ear normal.  Left Ear: External ear normal.  Mouth/Throat: Oropharynx is clear and moist.  Eyes: Conjunctivae and EOM are normal. Pupils are equal, round, and reactive to light.  Neck: Normal range of motion. Neck supple. No thyromegaly present.  Cardiovascular: Normal rate, regular rhythm and normal  heart sounds.  Exam reveals no gallop and no friction rub.   No murmur heard. Pulmonary/Chest: Effort normal and breath sounds normal. No respiratory distress. She has no wheezes.  Abdominal: Soft. Bowel sounds are normal. She exhibits no distension and no mass. There is no tenderness. There is no rebound and no guarding.  Musculoskeletal: Normal range of motion.  Lymphadenopathy:    She has no cervical adenopathy.  Neurological: She is alert and oriented to person, place, and time. She displays normal reflexes. No cranial nerve deficit. Coordination normal.  Skin: Skin is warm and dry.  Psychiatric: She has a normal mood and affect.       Assessment & Plan:  1. Morbid obesity  - long discussion about weight loss, diet, and exercise  2. Other fatigue ? From menopause, fibromyalgia, labs recently checked and normal other than need to add vitamin D - will get sleep study, has family history, obesity, large neck, fatigue in the AM - no caffeine after 3 pm  3. Left hip pain Aleve x 2 weeks, if not better schedule follow up

## 2016-07-06 NOTE — Patient Instructions (Signed)
11 Tips to Follow:  1. No caffeine after 3pm: Avoid beverages with caffeine (soda, tea, energy drinks, etc.) especially after 3pm. 2. Don't go to bed hungry: Have your evening meal at least 3 hrs. before going to sleep. It's fine to have a small bedtime snack such as a glass of milk and a few crackers but don't have a big meal. 3. Have a nightly routine before bed: Plan on "winding down" before you go to sleep. Begin relaxing about 1 hour before you go to bed. Try doing a quiet activity such as listening to calming music, reading a book or meditating. 4. Turn off the TV and ALL electronics including video games, tablets, laptops, etc. 1 hour before sleep, and keep them out of the bedroom. 5. Turn off your cell phone and all notifications (new email and text alerts) or even better, leave your phone outside your room while you sleep. Studies have shown that a part of your brain continues to respond to certain lights and sounds even while you're still asleep. 6. Make your bedroom quiet, dark and cool. If you can't control the noise, try wearing earplugs or using a fan to block out other sounds. 7. Practice relaxation techniques. Try reading a book or meditating or drain your brain by writing a list of what you need to do the next day. 8. Don't nap unless you feel sick: you'll have a better night's sleep. 9. Don't smoke, or quit if you do. Nicotine, alcohol, and marijuana can all keep you awake. Talk to your health care provider if you need help with substance use. 10. Most importantly, wake up at the same time every day (or within 1 hour of your usual wake up time) EVEN on the weekends. A regular wake up time promotes sleep hygiene and prevents sleep problems. 11. Reduce exposure to bright light in the last three hours of the day before going to sleep.  Maintaining good sleep hygiene and having good sleep habits lower your risk of developing sleep problems. Getting better sleep can also improve your  concentration and alertness. Try the simple steps in this guide. If you still have trouble getting enough rest, make an appointment with your health care provider.  I think it is possible that you have sleep apnea. It can cause interrupted sleep, headaches, frequent awakenings, fatigue, dry mouth, fast/slow heart beats, memory issues, anxiety/depression, swelling, numbness tingling hands/feet, weight gain, shortness of breath, and the list goes on. Sleep apnea needs to be ruled out because if it is left untreated it does eventually lead to abnormal heart beats, lung failure or heart failure as well as increasing the risk of heart attack and stroke. There are masks you can wear OR a mouth piece that I can give you information about. Often times though people feel MUCH better after getting treatment.   Sleep Apnea  Sleep apnea is a sleep disorder characterized by abnormal pauses in breathing while you sleep. When your breathing pauses, the level of oxygen in your blood decreases. This causes you to move out of deep sleep and into light sleep. As a result, your quality of sleep is poor, and the system that carries your blood throughout your body (cardiovascular system) experiences stress. If sleep apnea remains untreated, the following conditions can develop:  High blood pressure (hypertension).  Coronary artery disease.  Inability to achieve or maintain an erection (impotence).  Impairment of your thought process (cognitive dysfunction). There are three types of sleep apnea: 1. Obstructive  sleep apnea--Pauses in breathing during sleep because of a blocked airway. 2. Central sleep apnea--Pauses in breathing during sleep because the area of the brain that controls your breathing does not send the correct signals to the muscles that control breathing. 3. Mixed sleep apnea--A combination of both obstructive and central sleep apnea.  RISK FACTORS The following risk factors can increase your risk of  developing sleep apnea:  Being overweight.  Smoking.  Having narrow passages in your nose and throat.  Being of older age.  Being female.  Alcohol use.  Sedative and tranquilizer use.  Ethnicity. Among individuals younger than 35 years, African Americans are at increased risk of sleep apnea. SYMPTOMS   Difficulty staying asleep.  Daytime sleepiness and fatigue.  Loss of energy.  Irritability.  Loud, heavy snoring.  Morning headaches.  Trouble concentrating.  Forgetfulness.  Decreased interest in sex. DIAGNOSIS  In order to diagnose sleep apnea, your caregiver will perform a physical examination. Your caregiver may suggest that you take a home sleep test. Your caregiver may also recommend that you spend the night in a sleep lab. In the sleep lab, several monitors record information about your heart, lungs, and brain while you sleep. Your leg and arm movements and blood oxygen level are also recorded. TREATMENT The following actions may help to resolve mild sleep apnea:  Sleeping on your side.   Using a decongestant if you have nasal congestion.   Avoiding the use of depressants, including alcohol, sedatives, and narcotics.   Losing weight and modifying your diet if you are overweight. There also are devices and treatments to help open your airway:  Oral appliances. These are custom-made mouthpieces that shift your lower jaw forward and slightly open your bite. This opens your airway.  Devices that create positive airway pressure. This positive pressure "splints" your airway open to help you breathe better during sleep. The following devices create positive airway pressure:  Continuous positive airway pressure (CPAP) device. The CPAP device creates a continuous level of air pressure with an air pump. The air is delivered to your airway through a mask while you sleep. This continuous pressure keeps your airway open.  Nasal expiratory positive airway pressure  (EPAP) device. The EPAP device creates positive air pressure as you exhale. The device consists of single-use valves, which are inserted into each nostril and held in place by adhesive. The valves create very little resistance when you inhale but create much more resistance when you exhale. That increased resistance creates the positive airway pressure. This positive pressure while you exhale keeps your airway open, making it easier to breath when you inhale again.  Bilevel positive airway pressure (BPAP) device. The BPAP device is used mainly in patients with central sleep apnea. This device is similar to the CPAP device because it also uses an air pump to deliver continuous air pressure through a mask. However, with the BPAP machine, the pressure is set at two different levels. The pressure when you exhale is lower than the pressure when you inhale.  Surgery. Typically, surgery is only done if you cannot comply with less invasive treatments or if the less invasive treatments do not improve your condition. Surgery involves removing excess tissue in your airway to create a wider passage way. Document Released: 04/06/2002 Document Revised: 08/11/2012 Document Reviewed: 08/23/2011 [facility] Patient Information 2015 [place], [place]. This information is not intended to replace advice given to you by your health care provider. Make sure you discuss any questions you have with  your health care provider.

## 2016-07-09 ENCOUNTER — Ambulatory Visit: Payer: Self-pay | Admitting: Physician Assistant

## 2016-07-11 DIAGNOSIS — M1711 Unilateral primary osteoarthritis, right knee: Secondary | ICD-10-CM | POA: Diagnosis not present

## 2016-07-11 DIAGNOSIS — M25561 Pain in right knee: Secondary | ICD-10-CM | POA: Diagnosis not present

## 2016-07-11 DIAGNOSIS — M25552 Pain in left hip: Secondary | ICD-10-CM | POA: Diagnosis not present

## 2016-07-11 DIAGNOSIS — M25562 Pain in left knee: Secondary | ICD-10-CM | POA: Diagnosis not present

## 2016-07-26 ENCOUNTER — Other Ambulatory Visit: Payer: Self-pay

## 2016-07-26 MED ORDER — BUMETANIDE 1 MG PO TABS: 1.0000 mg | ORAL_TABLET | Freq: Every day | ORAL | 2 refills | Status: DC | PRN

## 2016-08-08 ENCOUNTER — Other Ambulatory Visit: Payer: Self-pay | Admitting: Internal Medicine

## 2016-08-08 DIAGNOSIS — Z1231 Encounter for screening mammogram for malignant neoplasm of breast: Secondary | ICD-10-CM

## 2016-08-14 ENCOUNTER — Ambulatory Visit: Payer: Self-pay | Admitting: Internal Medicine

## 2016-08-14 ENCOUNTER — Institutional Professional Consult (permissible substitution): Payer: BLUE CROSS/BLUE SHIELD | Admitting: Neurology

## 2016-08-15 ENCOUNTER — Telehealth: Payer: Self-pay

## 2016-08-15 ENCOUNTER — Ambulatory Visit (INDEPENDENT_AMBULATORY_CARE_PROVIDER_SITE_OTHER): Payer: BLUE CROSS/BLUE SHIELD | Admitting: Internal Medicine

## 2016-08-15 ENCOUNTER — Encounter: Payer: Self-pay | Admitting: Internal Medicine

## 2016-08-15 VITALS — BP 118/88 | HR 72 | Temp 97.8°F | Resp 16

## 2016-08-15 DIAGNOSIS — R7303 Prediabetes: Secondary | ICD-10-CM

## 2016-08-15 DIAGNOSIS — R030 Elevated blood-pressure reading, without diagnosis of hypertension: Secondary | ICD-10-CM

## 2016-08-15 DIAGNOSIS — I872 Venous insufficiency (chronic) (peripheral): Secondary | ICD-10-CM | POA: Diagnosis not present

## 2016-08-15 NOTE — Progress Notes (Signed)
This very nice 61 y.o.  Single WF presents for follow up with hx/o labile HTN, Hyperlipidemia, Pre-Diabetes and Vitamin D Deficiency.      Patient has hx/o labile or White Coat HTN and has had several elevated BP's over the years and has never been formally treated for HTN.  BP has been controlled at home. Today's BP is borderline elevated & not at goal -118/88. Patient has had no complaints of any cardiac type chest pain, palpitations, dyspnea/orthopnea/PND, dizziness, claudication, but she does have hx/o occasional dependent edema and very infrequently uses Bumex.     Hyperlipidemia is controlled with diet & meds. Patient denies myalgias or other med SE's. Last Lipids were almost to goal: Lab Results  Component Value Date   CHOL 206 (H) 03/07/2016   HDL 71 03/07/2016   LDLCALC 119 (H) 03/07/2016   TRIG 82 03/07/2016   CHOLHDL 2.9 03/07/2016      Also, the patient has history of  Morbid Obesity (BMI 38+) and PreDiabetes (A1c 6.5% in 2011 ) and has had no symptoms of reactive hypoglycemia, diabetic polys, paresthesias or visual blurring.  Last A1c was improved & nearer goal: Lab Results  Component Value Date   HGBA1C 5.8 (H) 03/07/2016      Further, the patient also has history of Vitamin D Deficiency ("21" in 2008)  And sporadically supplements vitamin D. Last vitamin D was still very low (goal 70-100):  Lab Results  Component Value Date   VD25OH 43 03/07/2016   Current Outpatient Prescriptions on File Prior to Visit  Medication Sig  . VITAMIN C Take  daily.  . bumetanide  1 MG tablet Take 1 tab daily as needed. For fluid.  . busPIRone  10 MG tablet Take daily.    Allergies  Allergen Reactions  . [place] Grass Allergy Skin Test Itching   PMHx:   Past Medical History:  Diagnosis Date  . Allergy    rag weed, pollen,dust  . Anxiety   . Asthma   . Bursitis of hip   . Diverticulosis   . External hemorrhoids   . Fibroid   . Fibromyalgia   . GERD (gastroesophageal reflux  disease)   . Labile hypertension   . Obesity   . Peripheral edema   . Prediabetes   . Vitamin D deficiency    Immunization History  Administered Date(s) Administered  . Influenza Split 01/28/2013  . Influenza-Unspecified 04/22/2015, 02/20/2016  . PPD Test 07/13/2014, 03/07/2016  . Td 01/14/2003  . Tdap 02/17/2013   Past Surgical History:  Procedure Laterality Date  . ABDOMINAL HYSTERECTOMY  2002  . COLONOSCOPY  09/02/2006   external hemorrhoids,diverticulosis  . KNEE SURGERY     Left  . LIPOMA EXCISION     Labial  . MANDIBLE SURGERY    . MOUTH SURGERY    . REPAIR ANKLE LIGAMENT  04-05-2014   x2  . TONSILLECTOMY     FHx:    Reviewed / unchanged  SHx:    Reviewed / unchanged  Systems Review:  Constitutional: Denies fever, chills, wt changes, headaches, insomnia, fatigue, night sweats, change in appetite. Eyes: Denies redness, blurred vision, diplopia, discharge, itchy, watery eyes.  ENT: Denies discharge, congestion, post nasal drip, epistaxis, sore throat, earache, hearing loss, dental pain, tinnitus, vertigo, sinus pain, snoring.  CV: Denies chest pain, palpitations, irregular heartbeat, syncope, dyspnea, diaphoresis, orthopnea, PND, claudication or edema. Respiratory: denies cough, dyspnea, DOE, pleurisy, hoarseness, laryngitis, wheezing.  Gastrointestinal: Denies dysphagia, odynophagia, heartburn,  reflux, water brash, abdominal pain or cramps, nausea, vomiting, bloating, diarrhea, constipation, hematemesis, melena, hematochezia  or hemorrhoids. Genitourinary: Denies dysuria, frequency, urgency, nocturia, hesitancy, discharge, hematuria or flank pain. Musculoskeletal: Denies arthralgias, myalgias, stiffness, jt. swelling, pain, limping or strain/sprain.  Skin: Denies pruritus, rash, hives, warts, acne, eczema or change in skin lesion(s). Neuro: No weakness, tremor, incoordination, spasms, paresthesia or pain. Psychiatric: Denies confusion, memory loss or sensory  loss. Endo: Denies change in weight, skin or hair change.  Heme/Lymph: No excessive bleeding, bruising or enlarged lymph nodes.  Physical Exam  BP 118/88   Pulse 72   Temp 97.8 F (36.6 C)   Resp 16   Appears well nourished, well groomed  and in no distress.  Eyes: PERRLA, EOMs, conjunctiva no swelling or erythema. Sinuses: No frontal/maxillary tenderness ENT/Mouth: EAC's clear, TM's nl w/o erythema, bulging. Nares clear w/o erythema, swelling, exudates. Oropharynx clear without erythema or exudates. Oral hygiene is good. Tongue normal, non obstructing. Hearing intact.  Neck: Supple. Thyroid nl. Car 2+/2+ without bruits, nodes or JVD. Chest: Respirations nl with BS clear & equal w/o rales, rhonchi, wheezing or stridor.  Cor: Heart sounds normal w/ regular rate and rhythm without sig. murmurs, gallops, clicks or rubs. Peripheral pulses normal and equal  without edema.  Abdomen: Soft & bowel sounds normal. Non-tender w/o guarding, rebound, hernias, masses or organomegaly.  Lymphatics: Unremarkable.  Musculoskeletal: Full ROM all peripheral extremities, joint stability, 5/5 strength and normal gait.  Skin: Warm, dry without exposed rashes, lesions or ecchymosis apparent.  Neuro: Cranial nerves intact, reflexes equal bilaterally. Sensory-motor testing grossly intact. Tendon reflexes grossly intact.  Pysch: Alert & oriented x 3.  Insight and judgement nl & appropriate. No ideations.  Assessment and Plan:  1. Venous insufficiency   2. White coat syndrome without diagnosis of hypertension  - Continue monitor blood pressure at home.  - Continue DASH diet. - Continue diet, exercise, lifestyle modifications.        Discussed  regular exercise, BP monitoring, and   of weight control to achieve/maintain BMI less than 25 and discussed med and SE's.  Patient has a great deal of anxiety about being labeled with "HTN" and she is explained that the weight or morbid obesity is a much more  critical issue for her predisposing her to Diabetes since she's already prediabetic and also reminded hr that Obesity is the leading risk factor for cancer.  Over 25 minutes of exam, counseling, chart review was performed.

## 2016-08-15 NOTE — Patient Instructions (Signed)

## 2016-08-15 NOTE — Telephone Encounter (Signed)
LM for patient to call back and r/s appt.

## 2016-08-28 ENCOUNTER — Ambulatory Visit
Admission: RE | Admit: 2016-08-28 | Discharge: 2016-08-28 | Disposition: A | Discharge status: Home / Self Care | Payer: BLUE CROSS/BLUE SHIELD | Source: Ambulatory Visit | Attending: Internal Medicine | Admitting: Internal Medicine

## 2016-08-28 DIAGNOSIS — Z1231 Encounter for screening mammogram for malignant neoplasm of breast: Secondary | ICD-10-CM | POA: Diagnosis not present

## 2016-09-28 DIAGNOSIS — M9904 Segmental and somatic dysfunction of sacral region: Secondary | ICD-10-CM | POA: Diagnosis not present

## 2016-09-28 DIAGNOSIS — M9903 Segmental and somatic dysfunction of lumbar region: Secondary | ICD-10-CM | POA: Diagnosis not present

## 2016-09-28 DIAGNOSIS — M545 Low back pain: Secondary | ICD-10-CM | POA: Diagnosis not present

## 2016-09-28 DIAGNOSIS — M542 Cervicalgia: Secondary | ICD-10-CM | POA: Diagnosis not present

## 2016-10-17 DIAGNOSIS — G43909 Migraine, unspecified, not intractable, without status migrainosus: Secondary | ICD-10-CM | POA: Diagnosis not present

## 2016-10-17 DIAGNOSIS — H5213 Myopia, bilateral: Secondary | ICD-10-CM | POA: Diagnosis not present

## 2016-10-17 DIAGNOSIS — H2513 Age-related nuclear cataract, bilateral: Secondary | ICD-10-CM | POA: Diagnosis not present

## 2016-10-17 DIAGNOSIS — H524 Presbyopia: Secondary | ICD-10-CM | POA: Diagnosis not present

## 2016-10-23 DIAGNOSIS — M9904 Segmental and somatic dysfunction of sacral region: Secondary | ICD-10-CM | POA: Diagnosis not present

## 2016-10-23 DIAGNOSIS — M9903 Segmental and somatic dysfunction of lumbar region: Secondary | ICD-10-CM | POA: Diagnosis not present

## 2016-10-23 DIAGNOSIS — M542 Cervicalgia: Secondary | ICD-10-CM | POA: Diagnosis not present

## 2016-10-23 DIAGNOSIS — M545 Low back pain: Secondary | ICD-10-CM | POA: Diagnosis not present

## 2016-10-24 DIAGNOSIS — M545 Low back pain: Secondary | ICD-10-CM | POA: Diagnosis not present

## 2016-10-24 DIAGNOSIS — M542 Cervicalgia: Secondary | ICD-10-CM | POA: Diagnosis not present

## 2016-10-24 DIAGNOSIS — M9903 Segmental and somatic dysfunction of lumbar region: Secondary | ICD-10-CM | POA: Diagnosis not present

## 2016-10-24 DIAGNOSIS — M9904 Segmental and somatic dysfunction of sacral region: Secondary | ICD-10-CM | POA: Diagnosis not present

## 2016-10-30 DIAGNOSIS — F4010 Social phobia, unspecified: Secondary | ICD-10-CM | POA: Diagnosis not present

## 2016-11-08 ENCOUNTER — Encounter: Payer: Self-pay | Admitting: Internal Medicine

## 2016-11-16 DIAGNOSIS — M25552 Pain in left hip: Secondary | ICD-10-CM | POA: Diagnosis not present

## 2016-11-16 DIAGNOSIS — M7062 Trochanteric bursitis, left hip: Secondary | ICD-10-CM | POA: Diagnosis not present

## 2016-11-16 DIAGNOSIS — M76892 Other specified enthesopathies of left lower limb, excluding foot: Secondary | ICD-10-CM | POA: Diagnosis not present

## 2016-11-16 DIAGNOSIS — M7061 Trochanteric bursitis, right hip: Secondary | ICD-10-CM | POA: Diagnosis not present

## 2016-12-05 DIAGNOSIS — M542 Cervicalgia: Secondary | ICD-10-CM | POA: Diagnosis not present

## 2016-12-05 DIAGNOSIS — M546 Pain in thoracic spine: Secondary | ICD-10-CM | POA: Diagnosis not present

## 2016-12-05 DIAGNOSIS — M9903 Segmental and somatic dysfunction of lumbar region: Secondary | ICD-10-CM | POA: Diagnosis not present

## 2016-12-05 DIAGNOSIS — M9901 Segmental and somatic dysfunction of cervical region: Secondary | ICD-10-CM | POA: Diagnosis not present

## 2016-12-18 DIAGNOSIS — M9903 Segmental and somatic dysfunction of lumbar region: Secondary | ICD-10-CM | POA: Diagnosis not present

## 2016-12-18 DIAGNOSIS — M542 Cervicalgia: Secondary | ICD-10-CM | POA: Diagnosis not present

## 2016-12-18 DIAGNOSIS — M546 Pain in thoracic spine: Secondary | ICD-10-CM | POA: Diagnosis not present

## 2016-12-18 DIAGNOSIS — M9901 Segmental and somatic dysfunction of cervical region: Secondary | ICD-10-CM | POA: Diagnosis not present

## 2017-01-07 DIAGNOSIS — M546 Pain in thoracic spine: Secondary | ICD-10-CM | POA: Diagnosis not present

## 2017-01-07 DIAGNOSIS — M9903 Segmental and somatic dysfunction of lumbar region: Secondary | ICD-10-CM | POA: Diagnosis not present

## 2017-01-07 DIAGNOSIS — M542 Cervicalgia: Secondary | ICD-10-CM | POA: Diagnosis not present

## 2017-01-07 DIAGNOSIS — M9901 Segmental and somatic dysfunction of cervical region: Secondary | ICD-10-CM | POA: Diagnosis not present

## 2017-01-25 DIAGNOSIS — M25561 Pain in right knee: Secondary | ICD-10-CM | POA: Diagnosis not present

## 2017-01-25 DIAGNOSIS — M1711 Unilateral primary osteoarthritis, right knee: Secondary | ICD-10-CM | POA: Insufficient documentation

## 2017-01-25 DIAGNOSIS — S83271A Complex tear of lateral meniscus, current injury, right knee, initial encounter: Secondary | ICD-10-CM | POA: Diagnosis not present

## 2017-01-31 ENCOUNTER — Encounter: Payer: Self-pay | Admitting: Obstetrics & Gynecology

## 2017-01-31 ENCOUNTER — Other Ambulatory Visit (HOSPITAL_COMMUNITY)
Admission: RE | Admit: 2017-01-31 | Discharge: 2017-01-31 | Disposition: A | Discharge status: Home / Self Care | Payer: BLUE CROSS/BLUE SHIELD | Source: Ambulatory Visit | Attending: Obstetrics & Gynecology | Admitting: Obstetrics & Gynecology

## 2017-01-31 ENCOUNTER — Ambulatory Visit (INDEPENDENT_AMBULATORY_CARE_PROVIDER_SITE_OTHER): Payer: BLUE CROSS/BLUE SHIELD | Admitting: Obstetrics & Gynecology

## 2017-01-31 VITALS — BP 120/78 | HR 80 | Resp 16 | Ht 64.75 in | Wt 228.0 lb

## 2017-01-31 DIAGNOSIS — E2839 Other primary ovarian failure: Secondary | ICD-10-CM | POA: Diagnosis not present

## 2017-01-31 DIAGNOSIS — M9903 Segmental and somatic dysfunction of lumbar region: Secondary | ICD-10-CM | POA: Diagnosis not present

## 2017-01-31 DIAGNOSIS — M546 Pain in thoracic spine: Secondary | ICD-10-CM | POA: Diagnosis not present

## 2017-01-31 DIAGNOSIS — Z124 Encounter for screening for malignant neoplasm of cervix: Secondary | ICD-10-CM | POA: Insufficient documentation

## 2017-01-31 DIAGNOSIS — M9901 Segmental and somatic dysfunction of cervical region: Secondary | ICD-10-CM | POA: Diagnosis not present

## 2017-01-31 DIAGNOSIS — Z01419 Encounter for gynecological examination (general) (routine) without abnormal findings: Secondary | ICD-10-CM | POA: Diagnosis not present

## 2017-01-31 DIAGNOSIS — M542 Cervicalgia: Secondary | ICD-10-CM | POA: Diagnosis not present

## 2017-01-31 NOTE — Progress Notes (Signed)
61 y.o. G0P0 SingleCaucasianF here for annual exam.  New patient.  Former patient of Dr. Cherylann Banas.  Denies vaginal bleeding.    PCP:  Dr. Melford Aase  No LMP recorded. Patient has had a hysterectomy.          Sexually active: No.  The current method of family planning is status post hysterectomy.   -- ovaries remain Exercising: Yes.    walking Smoker:  no  Health Maintenance: Pap:  2017 per patient normal History of abnormal Pap:  no MMG:  08/28/16 BIRADS 1 negative/density c Colonoscopy:  5 years ago.  Lymphoid aggregate.  Follow-up 10 years. BMD:   12/21/10 -- normal.  D/w pt recommendations.   TDaP:  2014 Pneumonia vaccine(s):  never Zostavax:   D/w pt today Hep C testing:  D/w pt today Screening Labs: discuss today   reports that she has never smoked. She has never used smokeless tobacco. She reports that she does not drink alcohol or use drugs.  Past Medical History:  Diagnosis Date  . Allergy    rag weed, pollen,dust  . Anxiety    mild; infrequent per patient  . Asthma   . Bursitis of hip   . Diverticulosis   . External hemorrhoids   . Fibroid   . Fibromyalgia   . GERD (gastroesophageal reflux disease)   . Labile hypertension   . Obesity   . Peripheral edema    infrequent per patient  . Prediabetes   . Vitamin D deficiency     Past Surgical History:  Procedure Laterality Date  . ABDOMINAL HYSTERECTOMY  2002  . COLONOSCOPY  09/02/2006   external hemorrhoids,diverticulosis  . KNEE SURGERY     Left  . LIPOMA EXCISION     Labial  . MANDIBLE SURGERY    . MOUTH SURGERY    . REPAIR ANKLE LIGAMENT  04-05-2014   x2  . TONSILLECTOMY      Current Outpatient Prescriptions  Medication Sig Dispense Refill  . ALPRAZolam (XANAX) 0.5 MG tablet Take 0.5 mg by mouth 3 (three) times daily as needed for anxiety.    . Ascorbic Acid (VITAMIN C PO) Take by mouth daily.    . beclomethasone (QVAR REDIHALER) 40 MCG/ACT inhaler Take 1 puff by mouth daily.    . bumetanide (BUMEX) 1  MG tablet Take 1 tablet (1 mg total) by mouth daily as needed. For fluid. 30 tablet 2  . busPIRone (BUSPAR) 15 MG tablet     . citalopram (CELEXA) 20 MG tablet Take 1 tablet by mouth daily.  0  . PROAIR HFA 108 (90 Base) MCG/ACT inhaler      No current facility-administered medications for this visit.     Family History  Problem Relation Age of Onset  . Hypertension Father   . Heart disease Father   . Diabetes Father   . Heart failure Father   . Uterine cancer Sister   . Diabetes Sister   . Stroke Mother   . Colon cancer Neg Hx   . Esophageal cancer Neg Hx   . Stomach cancer Neg Hx   . Rectal cancer Neg Hx     ROS:  Pertinent items are noted in HPI.  Otherwise, a comprehensive ROS was negative.  Exam:   BP 120/78 (BP Location: Right Arm, Patient Position: Sitting, Cuff Size: Large)   Pulse 80   Resp 16   Ht 5' 4.75" (1.645 m)   Wt 228 lb (103.4 kg)   BMI 38.23 kg/m  Height: 5' 4.75" (164.5 cm)  Ht Readings from Last 3 Encounters:  01/31/17 5' 4.75" (1.645 m)  07/06/16 5\' 5"  (1.651 m)  04/27/16 5\' 5"  (1.651 m)    General appearance: alert, cooperative and appears stated age Head: Normocephalic, without obvious abnormality, atraumatic Neck: no adenopathy, supple, symmetrical, trachea midline and thyroid normal to inspection and palpation Lungs: clear to auscultation bilaterally Breasts: normal appearance, no masses or tenderness Heart: regular rate and rhythm Abdomen: soft, non-tender; bowel sounds normal; no masses,  no organomegaly Extremities: extremities normal, atraumatic, no cyanosis or edema Skin: Skin color, texture, turgor normal. No rashes or lesions Lymph nodes: Cervical, supraclavicular, and axillary nodes normal. No abnormal inguinal nodes palpated Neurologic: Grossly normal   Pelvic: External genitalia:  no lesions              Urethra:  normal appearing urethra with no masses, tenderness or lesions              Bartholins and Skenes: normal                  Vagina: normal appearing vagina with normal color and discharge, no lesions              Cervix: absent              Pap taken: Yes.   Bimanual Exam:  Uterus:  uterus absent              Adnexa: no mass, fullness, tenderness               Rectovaginal: Confirms               Anus:  normal sphincter tone, no lesions  Chaperone was present for exam.  A:  Well Woman with normal exam PMP, no HRT H/O TAH in 2002 due to fibroids Anxiety  P:   Mammogram guidelines reviewed.  Doing 3D due to breast density. pap smear obtained today.  Pt desires yearly pap smears.  Guidelines reviewed with pt. BMD will be planned with next MMG. Pt will plan to get shingles vaccine.  Order given. Pt aware should have Hep C testing done.  Will do this with next blood work.   return annually or prn

## 2017-01-31 NOTE — Patient Instructions (Addendum)
Remember to schedule the mammogram and bone density together next year.  I placed the order for your bone density today.  Have the hepatitis C testing done with next blood work.

## 2017-02-01 LAB — CYTOLOGY - PAP: Diagnosis: NEGATIVE

## 2017-02-13 DIAGNOSIS — F4010 Social phobia, unspecified: Secondary | ICD-10-CM | POA: Diagnosis not present

## 2017-02-20 ENCOUNTER — Encounter: Payer: Self-pay | Admitting: Internal Medicine

## 2017-03-15 ENCOUNTER — Ambulatory Visit
Admission: RE | Admit: 2017-03-15 | Discharge: 2017-03-15 | Disposition: A | Discharge status: Home / Self Care | Payer: BLUE CROSS/BLUE SHIELD | Source: Ambulatory Visit | Attending: Obstetrics & Gynecology | Admitting: Obstetrics & Gynecology

## 2017-03-15 DIAGNOSIS — E2839 Other primary ovarian failure: Secondary | ICD-10-CM

## 2017-03-15 DIAGNOSIS — Z780 Asymptomatic menopausal state: Secondary | ICD-10-CM | POA: Diagnosis not present

## 2017-03-28 ENCOUNTER — Encounter: Payer: Self-pay | Admitting: Internal Medicine

## 2017-03-29 ENCOUNTER — Encounter: Payer: Self-pay | Admitting: Obstetrics & Gynecology

## 2017-03-29 ENCOUNTER — Ambulatory Visit (INDEPENDENT_AMBULATORY_CARE_PROVIDER_SITE_OTHER): Payer: BLUE CROSS/BLUE SHIELD | Admitting: Obstetrics & Gynecology

## 2017-03-29 ENCOUNTER — Other Ambulatory Visit: Payer: Self-pay

## 2017-03-29 VITALS — BP 118/80 | HR 76 | Resp 16 | Wt 228.0 lb

## 2017-03-29 DIAGNOSIS — Z6838 Body mass index (BMI) 38.0-38.9, adult: Secondary | ICD-10-CM

## 2017-03-29 DIAGNOSIS — Z719 Counseling, unspecified: Secondary | ICD-10-CM

## 2017-03-29 DIAGNOSIS — M2550 Pain in unspecified joint: Secondary | ICD-10-CM | POA: Diagnosis not present

## 2017-03-29 DIAGNOSIS — E669 Obesity, unspecified: Secondary | ICD-10-CM

## 2017-03-29 NOTE — Progress Notes (Signed)
GYNECOLOGY  VISIT  CC:   Questions after AEX  HPI: 61 y.o. G0P0 Single Caucasian female here for follow-up questions after she was here for her annual exam.  She would not communicate these questions with any of my staff.  She has a hand written page of questions that has approximately 40 different questions on it.  These include questions about her bone density, colonoscopy screening and recommendations, concerns about weight and inability to lose weight, arthritis, lumpiness to skin, desires for extensive blood work and possible imaging to assess for "problems" that might be missed, recommended vaccines.  All questions were addressed individually.  Pt is aware that some of her questions are outside my expertise area and I made recommendations about how she could possibly get answers to her questions.  Also, d/w pt how I could help her with her concerns about her health and how to help her be more reassured about her health.  She is not sure about an answer to this but will consider.    GYNECOLOGIC HISTORY: No LMP recorded. Patient has had a hysterectomy. Contraception: hysterectomy Menopausal hormone therapy: none  Patient Active Problem List   Diagnosis Date Noted  . Primary osteoarthritis of right knee 01/25/2017  . Mixed hyperlipidemia 07/13/2014  . Mild persistent asthma   12/19/2013  . Anxiety   . Prediabetes   . Vitamin D deficiency   . Fibromyalgia   . Myopia 10/28/2012  . Morbid obesity (BMI 37.11) 06/10/2009  . GERD 05/26/2008  . Allergic rhinitis 05/20/2008  . Exercise-induced bronchospasm 05/20/2008  . Diverticulosis 09/02/2006    Past Medical History:  Diagnosis Date  . Allergy    rag weed, pollen,dust  . Anxiety    mild; infrequent per patient  . Asthma   . Bursitis of hip   . Diverticulosis   . External hemorrhoids   . Fibroid   . Fibromyalgia   . GERD (gastroesophageal reflux disease)   . Obesity   . Peripheral edema    infrequent per patient  .  Prediabetes   . Vitamin D deficiency     Past Surgical History:  Procedure Laterality Date  . ABDOMINAL HYSTERECTOMY  2002   ovaries remain  . COLONOSCOPY  09/02/2006   external hemorrhoids,diverticulosis  . KNEE SURGERY     Left  . LIPOMA EXCISION     Labial  . MANDIBLE SURGERY    . MOUTH SURGERY    . REPAIR ANKLE LIGAMENT  04-05-2014   x2  . TONSILLECTOMY      MEDS:   Current Outpatient Medications on File Prior to Visit  Medication Sig Dispense Refill  . ALPRAZolam (XANAX) 0.5 MG tablet Take 0.5 mg by mouth 3 (three) times daily as needed for anxiety.    . Ascorbic Acid (VITAMIN C PO) Take by mouth daily.    . beclomethasone (QVAR REDIHALER) 40 MCG/ACT inhaler Take 1 puff by mouth daily.    . bumetanide (BUMEX) 1 MG tablet Take 1 tablet (1 mg total) by mouth daily as needed. For fluid. 30 tablet 2  . busPIRone (BUSPAR) 15 MG tablet     . citalopram (CELEXA) 20 MG tablet Take 1 tablet by mouth daily.  0  . PROAIR HFA 108 (90 Base) MCG/ACT inhaler      No current facility-administered medications on file prior to visit.     ALLERGIES: [place] grass extract and Mold extract  [[email or web address]]  Family History  Problem Relation Age of Onset  . Hypertension Father   .  Heart disease Father   . Diabetes Father   . Heart failure Father   . Uterine cancer Sister   . Diabetes Sister   . Stroke Mother   . Colon cancer Neg Hx   . Esophageal cancer Neg Hx   . Stomach cancer Neg Hx   . Rectal cancer Neg Hx     SH:  Single, non smoker  Review of Systems  Constitutional: Negative.   Respiratory: Negative.   Cardiovascular: Negative.   Psychiatric/Behavioral: The patient is nervous/anxious.     PHYSICAL EXAMINATION:    BP 118/80 (BP Location: Right Arm, Patient Position: Sitting, Cuff Size: Large)   Pulse 76   Resp 16   Wt 228 lb (103.4 kg)   BMI 38.23 kg/m     General appearance: alert, cooperative and appears stated age No other physical exam  performed.   Assessment: Arthritis, joint pain Obesity, frustrations with weight Counseling regarding a wide range of questions that she brought with her today Anxiety  Plan: Free T4 and ESR obtained today.  Will communicate results to pt   ~30 minutes spent with patient with all of this time in face to face discussion of her list of questions/concerns.

## 2017-03-30 LAB — T4, FREE: Free T4: 1.05 ng/dL (ref 0.82–1.77)

## 2017-03-30 LAB — SEDIMENTATION RATE: Sed Rate: 25 mm/hr (ref 0–40)

## 2017-05-31 DIAGNOSIS — M9903 Segmental and somatic dysfunction of lumbar region: Secondary | ICD-10-CM | POA: Diagnosis not present

## 2017-05-31 DIAGNOSIS — M9904 Segmental and somatic dysfunction of sacral region: Secondary | ICD-10-CM | POA: Diagnosis not present

## 2017-05-31 DIAGNOSIS — M5442 Lumbago with sciatica, left side: Secondary | ICD-10-CM | POA: Diagnosis not present

## 2017-05-31 DIAGNOSIS — M5441 Lumbago with sciatica, right side: Secondary | ICD-10-CM | POA: Diagnosis not present

## 2017-06-06 DIAGNOSIS — D692 Other nonthrombocytopenic purpura: Secondary | ICD-10-CM | POA: Diagnosis not present

## 2017-06-06 DIAGNOSIS — L720 Epidermal cyst: Secondary | ICD-10-CM | POA: Diagnosis not present

## 2017-06-06 DIAGNOSIS — B078 Other viral warts: Secondary | ICD-10-CM | POA: Diagnosis not present

## 2017-06-06 DIAGNOSIS — L438 Other lichen planus: Secondary | ICD-10-CM | POA: Diagnosis not present

## 2017-06-06 DIAGNOSIS — D1801 Hemangioma of skin and subcutaneous tissue: Secondary | ICD-10-CM | POA: Diagnosis not present

## 2017-06-10 DIAGNOSIS — F4010 Social phobia, unspecified: Secondary | ICD-10-CM | POA: Diagnosis not present

## 2017-06-27 DIAGNOSIS — M9903 Segmental and somatic dysfunction of lumbar region: Secondary | ICD-10-CM | POA: Diagnosis not present

## 2017-06-27 DIAGNOSIS — M5441 Lumbago with sciatica, right side: Secondary | ICD-10-CM | POA: Diagnosis not present

## 2017-06-27 DIAGNOSIS — M9904 Segmental and somatic dysfunction of sacral region: Secondary | ICD-10-CM | POA: Diagnosis not present

## 2017-06-27 DIAGNOSIS — M5442 Lumbago with sciatica, left side: Secondary | ICD-10-CM | POA: Diagnosis not present

## 2017-07-17 DIAGNOSIS — M7061 Trochanteric bursitis, right hip: Secondary | ICD-10-CM | POA: Diagnosis not present

## 2017-07-17 DIAGNOSIS — M7062 Trochanteric bursitis, left hip: Secondary | ICD-10-CM | POA: Diagnosis not present

## 2017-07-18 DIAGNOSIS — Z6838 Body mass index (BMI) 38.0-38.9, adult: Secondary | ICD-10-CM | POA: Diagnosis not present

## 2017-07-18 DIAGNOSIS — M533 Sacrococcygeal disorders, not elsewhere classified: Secondary | ICD-10-CM | POA: Diagnosis not present

## 2017-07-18 DIAGNOSIS — R030 Elevated blood-pressure reading, without diagnosis of hypertension: Secondary | ICD-10-CM | POA: Diagnosis not present

## 2017-07-22 DIAGNOSIS — M9904 Segmental and somatic dysfunction of sacral region: Secondary | ICD-10-CM | POA: Diagnosis not present

## 2017-07-22 DIAGNOSIS — M9903 Segmental and somatic dysfunction of lumbar region: Secondary | ICD-10-CM | POA: Diagnosis not present

## 2017-07-22 DIAGNOSIS — M62830 Muscle spasm of back: Secondary | ICD-10-CM | POA: Diagnosis not present

## 2017-07-22 DIAGNOSIS — M545 Low back pain: Secondary | ICD-10-CM | POA: Diagnosis not present

## 2017-07-25 DIAGNOSIS — M545 Low back pain: Secondary | ICD-10-CM | POA: Diagnosis not present

## 2017-07-25 DIAGNOSIS — M9903 Segmental and somatic dysfunction of lumbar region: Secondary | ICD-10-CM | POA: Diagnosis not present

## 2017-07-25 DIAGNOSIS — M9904 Segmental and somatic dysfunction of sacral region: Secondary | ICD-10-CM | POA: Diagnosis not present

## 2017-07-25 DIAGNOSIS — M62830 Muscle spasm of back: Secondary | ICD-10-CM | POA: Diagnosis not present

## 2017-07-29 DIAGNOSIS — M9904 Segmental and somatic dysfunction of sacral region: Secondary | ICD-10-CM | POA: Diagnosis not present

## 2017-07-29 DIAGNOSIS — M62830 Muscle spasm of back: Secondary | ICD-10-CM | POA: Diagnosis not present

## 2017-07-29 DIAGNOSIS — M545 Low back pain: Secondary | ICD-10-CM | POA: Diagnosis not present

## 2017-07-29 DIAGNOSIS — M9903 Segmental and somatic dysfunction of lumbar region: Secondary | ICD-10-CM | POA: Diagnosis not present

## 2017-07-31 DIAGNOSIS — M62830 Muscle spasm of back: Secondary | ICD-10-CM | POA: Diagnosis not present

## 2017-07-31 DIAGNOSIS — M9904 Segmental and somatic dysfunction of sacral region: Secondary | ICD-10-CM | POA: Diagnosis not present

## 2017-07-31 DIAGNOSIS — M9903 Segmental and somatic dysfunction of lumbar region: Secondary | ICD-10-CM | POA: Diagnosis not present

## 2017-07-31 DIAGNOSIS — M545 Low back pain: Secondary | ICD-10-CM | POA: Diagnosis not present

## 2017-08-05 DIAGNOSIS — M9904 Segmental and somatic dysfunction of sacral region: Secondary | ICD-10-CM | POA: Diagnosis not present

## 2017-08-05 DIAGNOSIS — M62830 Muscle spasm of back: Secondary | ICD-10-CM | POA: Diagnosis not present

## 2017-08-05 DIAGNOSIS — M545 Low back pain: Secondary | ICD-10-CM | POA: Diagnosis not present

## 2017-08-05 DIAGNOSIS — M9903 Segmental and somatic dysfunction of lumbar region: Secondary | ICD-10-CM | POA: Diagnosis not present

## 2017-08-08 DIAGNOSIS — M62830 Muscle spasm of back: Secondary | ICD-10-CM | POA: Diagnosis not present

## 2017-08-08 DIAGNOSIS — M545 Low back pain: Secondary | ICD-10-CM | POA: Diagnosis not present

## 2017-08-08 DIAGNOSIS — M9904 Segmental and somatic dysfunction of sacral region: Secondary | ICD-10-CM | POA: Diagnosis not present

## 2017-08-08 DIAGNOSIS — M9903 Segmental and somatic dysfunction of lumbar region: Secondary | ICD-10-CM | POA: Diagnosis not present

## 2017-08-12 DIAGNOSIS — M9904 Segmental and somatic dysfunction of sacral region: Secondary | ICD-10-CM | POA: Diagnosis not present

## 2017-08-12 DIAGNOSIS — M9903 Segmental and somatic dysfunction of lumbar region: Secondary | ICD-10-CM | POA: Diagnosis not present

## 2017-08-12 DIAGNOSIS — M545 Low back pain: Secondary | ICD-10-CM | POA: Diagnosis not present

## 2017-08-12 DIAGNOSIS — M62830 Muscle spasm of back: Secondary | ICD-10-CM | POA: Diagnosis not present

## 2017-08-15 DIAGNOSIS — M9904 Segmental and somatic dysfunction of sacral region: Secondary | ICD-10-CM | POA: Diagnosis not present

## 2017-08-15 DIAGNOSIS — M9903 Segmental and somatic dysfunction of lumbar region: Secondary | ICD-10-CM | POA: Diagnosis not present

## 2017-08-15 DIAGNOSIS — M545 Low back pain: Secondary | ICD-10-CM | POA: Diagnosis not present

## 2017-08-15 DIAGNOSIS — M62830 Muscle spasm of back: Secondary | ICD-10-CM | POA: Diagnosis not present

## 2017-08-21 DIAGNOSIS — M62830 Muscle spasm of back: Secondary | ICD-10-CM | POA: Diagnosis not present

## 2017-08-21 DIAGNOSIS — M9903 Segmental and somatic dysfunction of lumbar region: Secondary | ICD-10-CM | POA: Diagnosis not present

## 2017-08-21 DIAGNOSIS — M9904 Segmental and somatic dysfunction of sacral region: Secondary | ICD-10-CM | POA: Diagnosis not present

## 2017-08-21 DIAGNOSIS — M545 Low back pain: Secondary | ICD-10-CM | POA: Diagnosis not present

## 2017-09-05 DIAGNOSIS — F4010 Social phobia, unspecified: Secondary | ICD-10-CM | POA: Diagnosis not present

## 2017-10-18 DIAGNOSIS — H5213 Myopia, bilateral: Secondary | ICD-10-CM | POA: Diagnosis not present

## 2017-10-23 DIAGNOSIS — R05 Cough: Secondary | ICD-10-CM | POA: Diagnosis not present

## 2017-10-23 DIAGNOSIS — R062 Wheezing: Secondary | ICD-10-CM | POA: Diagnosis not present

## 2017-12-10 DIAGNOSIS — M76891 Other specified enthesopathies of right lower limb, excluding foot: Secondary | ICD-10-CM | POA: Diagnosis not present

## 2017-12-10 DIAGNOSIS — M76892 Other specified enthesopathies of left lower limb, excluding foot: Secondary | ICD-10-CM | POA: Diagnosis not present

## 2017-12-29 DIAGNOSIS — S82899A Other fracture of unspecified lower leg, initial encounter for closed fracture: Secondary | ICD-10-CM

## 2017-12-29 HISTORY — DX: Other fracture of unspecified lower leg, initial encounter for closed fracture: S82.899A

## 2018-01-09 DIAGNOSIS — F4010 Social phobia, unspecified: Secondary | ICD-10-CM | POA: Diagnosis not present

## 2018-01-22 ENCOUNTER — Other Ambulatory Visit: Payer: Self-pay | Admitting: Internal Medicine

## 2018-01-25 ENCOUNTER — Ambulatory Visit (HOSPITAL_COMMUNITY)
Admission: EM | Admit: 2018-01-25 | Discharge: 2018-01-25 | Disposition: A | Discharge status: Home / Self Care | Payer: BLUE CROSS/BLUE SHIELD | Attending: Family Medicine | Admitting: Family Medicine

## 2018-01-25 ENCOUNTER — Other Ambulatory Visit: Payer: Self-pay

## 2018-01-25 ENCOUNTER — Ambulatory Visit (INDEPENDENT_AMBULATORY_CARE_PROVIDER_SITE_OTHER): Payer: BLUE CROSS/BLUE SHIELD

## 2018-01-25 DIAGNOSIS — S86911A Strain of unspecified muscle(s) and tendon(s) at lower leg level, right leg, initial encounter: Secondary | ICD-10-CM

## 2018-01-25 DIAGNOSIS — S82832A Other fracture of upper and lower end of left fibula, initial encounter for closed fracture: Secondary | ICD-10-CM | POA: Diagnosis not present

## 2018-01-25 DIAGNOSIS — S82402A Unspecified fracture of shaft of left fibula, initial encounter for closed fracture: Secondary | ICD-10-CM | POA: Diagnosis not present

## 2018-01-25 MED ORDER — TRAMADOL HCL 50 MG PO TABS: 50.0000 mg | ORAL_TABLET | Freq: Two times a day (BID) | ORAL | 0 refills | Status: DC | PRN

## 2018-01-25 NOTE — ED Provider Notes (Signed)
[place]   [id number] 01/25/18 Arrival Time: 7858  CC: Right knee and left ankle pain  SUBJECTIVE: History from: patient. Bridget Joyce is a 62 y.o. female complains of right knee and left ankle pain that began 3 hours ago.  Symptoms began after she missed the bottom step while walking down stairs at a furniture store.  Localizes the pain to the medial right knee and lateral and posterior of left ankle.  Describes the pain as constant and 8-10/10.  Has tried icing with minimal relief.  Symptoms are made worse with twisting right knee and weight-bearing on left ankle.  Denies similar symptoms in the past. Complains of swelling, ecchymosis, and redness.   Denies fever, chills, weakness, numbness and tingling.      ROS: As per HPI.  Past Medical History:  Diagnosis Date  . Allergy    rag weed, pollen,dust  . Anxiety    mild; infrequent per patient  . Asthma   . Bursitis of hip   . Diverticulosis   . External hemorrhoids   . Fibroid   . Fibromyalgia   . GERD (gastroesophageal reflux disease)   . Obesity   . Peripheral edema    infrequent per patient  . Prediabetes   . Vitamin D deficiency    Past Surgical History:  Procedure Laterality Date  . ABDOMINAL HYSTERECTOMY  2002   ovaries remain  . COLONOSCOPY  09/02/2006   external hemorrhoids,diverticulosis  . KNEE SURGERY     Left  . LIPOMA EXCISION     Labial  . MANDIBLE SURGERY    . MOUTH SURGERY    . REPAIR ANKLE LIGAMENT  04-05-2014   x2  . TONSILLECTOMY     Allergies  Allergen Reactions  . [place] Grass Extract Itching  . Mold Extract  [Trichophyton] Itching   No current facility-administered medications on file prior to encounter.    Current Outpatient Medications on File Prior to Encounter  Medication Sig Dispense Refill  . ALPRAZolam (XANAX) 0.5 MG tablet Take 0.5 mg by mouth 3 (three) times daily as needed for anxiety.    . Ascorbic Acid (VITAMIN C PO) Take by mouth daily.    . beclomethasone  (QVAR REDIHALER) 40 MCG/ACT inhaler Take 1 puff by mouth daily.    . bumetanide (BUMEX) 1 MG tablet Take 1 tablet (1 mg total) by mouth daily as needed. For fluid. 30 tablet 2  . busPIRone (BUSPAR) 15 MG tablet     . citalopram (CELEXA) 20 MG tablet Take 1 tablet by mouth daily.  0  . PROAIR HFA 108 (90 Base) MCG/ACT inhaler      Social History   Socioeconomic History  . Marital status: Single    Spouse name: Not on file  . Number of children: Not on file  . Years of education: Not on file  . Highest education level: Not on file  Occupational History  . Occupation: Event organiser: [employer]  Social Needs  . Financial resource strain: Not on file  . Food insecurity:    Worry: Not on file    Inability: Not on file  . Transportation needs:    Medical: Not on file    Non-medical: Not on file  Tobacco Use  . Smoking status: Never Smoker  . Smokeless tobacco: Never Used  Substance and Sexual Activity  . Alcohol use: No  . Drug use: No  . Sexual activity: Never    Birth control/protection: Abstinence  Lifestyle  .  Physical activity:    Days per week: Not on file    Minutes per session: Not on file  . Stress: Not on file  Relationships  . Social connections:    Talks on phone: Not on file    Gets together: Not on file    Attends religious service: Not on file    Active member of club or organization: Not on file    Attends meetings of clubs or organizations: Not on file    Relationship status: Not on file  . Intimate partner violence:    Fear of current or ex partner: Not on file    Emotionally abused: Not on file    Physically abused: Not on file    Forced sexual activity: Not on file  Other Topics Concern  . Not on file  Social History Narrative  . Not on file   Family History  Problem Relation Age of Onset  . Hypertension Father   . Heart disease Father   . Diabetes Father   . Heart failure Father   . Uterine cancer Sister   . Diabetes Sister     . Stroke Mother   . Colon cancer Neg Hx   . Esophageal cancer Neg Hx   . Stomach cancer Neg Hx   . Rectal cancer Neg Hx     OBJECTIVE:  Vitals:   01/25/18 1541 01/25/18 1547  BP: 134/68   Pulse: 71   Resp: 18   Temp: 98.1 F (36.7 C)   SpO2: 100%   Weight:  219 lb (99.3 kg)    General appearance: AOx3; in no acute distress.  Head: NCAT Lungs: CTA bilaterally Heart: RRR.  Clear S1 and S2 without murmur, gallops, or rubs.  Radial pulses 2+ bilaterally. Posterior tibialis pulse 2+ Musculoskeletal: Right knee and left ankle Inspection: Left ankle with significant swelling over the lateral malleolus  Palpation: Tender to palpation over the anteromedial right knee; tender to palpation over the distal fibula and lateral malleolus  ROM: FROM about the right knee; LROM about the left ankle Strength: RT KNEE: 5/5 knee flexion, 5/5 knee extension LT ANKLE: Strength deferred due to pain Stability: Anterior/ posterior drawer intact for right knee Skin: warm and dry Neurologic: Antalgic gait; Sensation intact about the upper/ lower extremities Psychological: alert and cooperative; normal mood and affect  DIAGNOSTIC STUDIES:  Dg Ankle Complete Left  Result Date: 01/25/2018 CLINICAL DATA:  Pain after trauma EXAM: LEFT ANKLE COMPLETE - 3+ VIEW COMPARISON:  None. FINDINGS: Lateral soft tissue swelling. There is a fracture through the distal diaphysis, extending into the metaphysis. Minimal displacement. The ankle mortise is intact. The tibia is intact. IMPRESSION: Distal fibular fracture as above. Electronically Signed   By: Dorise Bullion III M.D   On: 01/25/2018 16:35    ASSESSMENT & PLAN:  1. Other closed fracture of distal end of left fibula, initial encounter   2. Knee strain, right, initial encounter     Meds ordered this encounter  Medications  . traMADol (ULTRAM) 50 MG tablet    Sig: Take 1 tablet (50 mg total) by mouth every 12 (twelve) hours as needed for severe pain.     Dispense:  10 tablet    Refill:  0    Order Specific Question:   Supervising Provider    Answer:   Wynona Luna [[id number]]   Rest, ice, elevation and wear boot Knee sleeve given. Walking boot given.  Wear until cleared by ortho.  Use mobile  scooter as needed.   Follow up with orthopedist for further evaluation and management Tramadol prescribed.  Use as needed for severe break-through pain Return or go to the ED if you have any new or worsening symptoms such as worsening pain, numbness, tingling, etc...  Reviewed expectations re: course of current medical issues. Questions answered. Outlined signs and symptoms indicating need for more acute intervention. Patient verbalized understanding. After Visit Summary given.    Lestine Box, PA-C 01/25/18 1738

## 2018-01-25 NOTE — Discharge Instructions (Addendum)
Knee sleeve given. Walking boot given.  Wear until cleared by ortho.  Use mobile scooter as needed.   Follow up with orthopedist for further evaluation and management Tramadol prescribed.  Use as needed for severe break-through pain Return or go to the ED if you have any new or worsening symptoms such as worsening pain, numbness, tingling, etc..Marland Kitchen

## 2018-01-25 NOTE — ED Triage Notes (Signed)
Pt states she fell at furniture store today 3 hours ago. Pt has right knee pain.

## 2018-01-27 DIAGNOSIS — S82892A Other fracture of left lower leg, initial encounter for closed fracture: Secondary | ICD-10-CM | POA: Diagnosis not present

## 2018-02-10 DIAGNOSIS — S82892A Other fracture of left lower leg, initial encounter for closed fracture: Secondary | ICD-10-CM | POA: Diagnosis not present

## 2018-02-24 DIAGNOSIS — S82892A Other fracture of left lower leg, initial encounter for closed fracture: Secondary | ICD-10-CM | POA: Diagnosis not present

## 2018-03-05 ENCOUNTER — Other Ambulatory Visit: Payer: Self-pay | Admitting: Internal Medicine

## 2018-03-05 ENCOUNTER — Ambulatory Visit: Payer: BLUE CROSS/BLUE SHIELD | Admitting: *Deleted

## 2018-03-05 DIAGNOSIS — S82892A Other fracture of left lower leg, initial encounter for closed fracture: Secondary | ICD-10-CM | POA: Diagnosis not present

## 2018-03-05 DIAGNOSIS — Z1231 Encounter for screening mammogram for malignant neoplasm of breast: Secondary | ICD-10-CM

## 2018-03-07 DIAGNOSIS — M25561 Pain in right knee: Secondary | ICD-10-CM | POA: Diagnosis not present

## 2018-03-11 ENCOUNTER — Ambulatory Visit: Payer: BLUE CROSS/BLUE SHIELD

## 2018-03-14 ENCOUNTER — Ambulatory Visit: Payer: BLUE CROSS/BLUE SHIELD

## 2018-03-20 ENCOUNTER — Ambulatory Visit
Admission: RE | Admit: 2018-03-20 | Discharge: 2018-03-20 | Disposition: A | Discharge status: Home / Self Care | Payer: BLUE CROSS/BLUE SHIELD | Source: Ambulatory Visit | Attending: Internal Medicine | Admitting: Internal Medicine

## 2018-03-20 DIAGNOSIS — Z1231 Encounter for screening mammogram for malignant neoplasm of breast: Secondary | ICD-10-CM

## 2018-03-24 DIAGNOSIS — S82892A Other fracture of left lower leg, initial encounter for closed fracture: Secondary | ICD-10-CM | POA: Diagnosis not present

## 2018-04-05 ENCOUNTER — Other Ambulatory Visit: Payer: Self-pay | Admitting: Psychiatry

## 2018-04-07 DIAGNOSIS — H43813 Vitreous degeneration, bilateral: Secondary | ICD-10-CM | POA: Diagnosis not present

## 2018-04-07 NOTE — Telephone Encounter (Signed)
Need to review paper chart  

## 2018-04-08 ENCOUNTER — Ambulatory Visit (INDEPENDENT_AMBULATORY_CARE_PROVIDER_SITE_OTHER): Payer: Self-pay | Admitting: *Deleted

## 2018-04-08 DIAGNOSIS — I8393 Asymptomatic varicose veins of bilateral lower extremities: Secondary | ICD-10-CM

## 2018-04-08 DIAGNOSIS — I781 Nevus, non-neoplastic: Secondary | ICD-10-CM

## 2018-04-08 NOTE — Progress Notes (Signed)
X=.3% Sotradecol administered with a 27g butterfly.  Patient received a total of 6cc  Just has tiny vessels on the front of her legs. Easy access. Tol well. Anticipate good results. Follow prn.  Photos: Yes.    Compression stockings applied: Yes.

## 2018-04-14 ENCOUNTER — Encounter: Payer: Self-pay | Admitting: Emergency Medicine

## 2018-04-14 DIAGNOSIS — F411 Generalized anxiety disorder: Secondary | ICD-10-CM | POA: Insufficient documentation

## 2018-04-14 DIAGNOSIS — F341 Dysthymic disorder: Secondary | ICD-10-CM | POA: Insufficient documentation

## 2018-04-16 DIAGNOSIS — S83411A Sprain of medial collateral ligament of right knee, initial encounter: Secondary | ICD-10-CM | POA: Diagnosis not present

## 2018-04-18 ENCOUNTER — Ambulatory Visit: Payer: BLUE CROSS/BLUE SHIELD | Admitting: Obstetrics & Gynecology

## 2018-04-18 ENCOUNTER — Other Ambulatory Visit: Payer: Self-pay

## 2018-04-18 ENCOUNTER — Encounter: Payer: Self-pay | Admitting: Obstetrics & Gynecology

## 2018-04-18 ENCOUNTER — Encounter

## 2018-04-18 VITALS — BP 122/70 | HR 76 | Resp 16 | Ht 65.0 in | Wt 223.2 lb

## 2018-04-18 DIAGNOSIS — Z01419 Encounter for gynecological examination (general) (routine) without abnormal findings: Secondary | ICD-10-CM

## 2018-04-18 DIAGNOSIS — S82892A Other fracture of left lower leg, initial encounter for closed fracture: Secondary | ICD-10-CM | POA: Diagnosis not present

## 2018-04-18 NOTE — Progress Notes (Signed)
62 y.o. G0P0 Single White or Caucasian female here for annual exam.  Broke her left ankle after stepping off a step funny.  Wore a boot and was non weight bearing for six weeks.  Worked from home during this time.  Has follow up appt today.  Hopefully she will be out of the brace today.  Still having some pain.  Is not sure if she will need PT.  Denies vaginal bleeding.  Did get a flu shot.  PCP:  Dr. Melford Aase  Patient's last menstrual period was 10/14/2000.          Sexually active: No.  The current method of family planning is status post hysterectomy.    Exercising: No.   Smoker:  no  Health Maintenance: Pap:  01/31/17 Neg   07/05/14 Neg  History of abnormal Pap:  no MMG:  03/20/18 BIRADS1:neg  Colonoscopy:  01/08/12 polypoid tissue.  Follow up in 10 years.  Done with Dr. Carlean Purl.   BMD:   03/15/17 normal  TDaP:  2014 Pneumonia vaccine(s):  n/a Shingrix: completed  Hep C testing: Has donated blood Screening Labs: will come back fasting    reports that she has never smoked. She has never used smokeless tobacco. She reports that she does not drink alcohol or use drugs.  Past Medical History:  Diagnosis Date  . Allergy    rag weed, pollen,dust  . Ankle fracture 12/2017   Left   . Anxiety    mild; infrequent per patient  . Asthma   . Bursitis of hip   . Diverticulosis   . External hemorrhoids   . Fibroid   . Fibromyalgia   . GERD (gastroesophageal reflux disease)   . Obesity   . Peripheral edema    infrequent per patient  . Prediabetes   . Vitamin D deficiency     Past Surgical History:  Procedure Laterality Date  . ABDOMINAL HYSTERECTOMY  2002   ovaries remain  . COLONOSCOPY  09/02/2006   external hemorrhoids,diverticulosis  . KNEE SURGERY     Left  . LIPOMA EXCISION     Labial  . MANDIBLE SURGERY    . MOUTH SURGERY    . REPAIR ANKLE LIGAMENT  04-05-2014   x2  . TONSILLECTOMY      Current Outpatient Medications  Medication Sig Dispense Refill  .  ALPRAZolam (XANAX) 0.5 MG tablet Take 0.5 mg by mouth 3 (three) times daily as needed for anxiety.    . Ascorbic Acid (VITAMIN C PO) Take by mouth daily.    . beclomethasone (QVAR REDIHALER) 40 MCG/ACT inhaler Take 1 puff by mouth daily.    . bumetanide (BUMEX) 1 MG tablet Take 1 tablet (1 mg total) by mouth daily as needed. For fluid. 30 tablet 2  . busPIRone (BUSPAR) 15 MG tablet Take 15 mg by mouth daily.     . citalopram (CELEXA) 20 MG tablet TAKE 1 TABLET BY MOUTH EVERY DAY 90 tablet 0  . PROAIR HFA 108 (90 Base) MCG/ACT inhaler      No current facility-administered medications for this visit.     Family History  Problem Relation Age of Onset  . Hypertension Father   . Heart disease Father   . Diabetes Father   . Heart failure Father   . Uterine cancer Sister   . Diabetes Sister   . Stroke Mother   . Colon cancer Neg Hx   . Esophageal cancer Neg Hx   . Stomach cancer Neg Hx   .  Rectal cancer Neg Hx     Review of Systems  All other systems reviewed and are negative.   Exam:   BP 122/70 (BP Location: Right Arm, Patient Position: Sitting, Cuff Size: Large)   Pulse 76   Resp 16   Ht 5\' 5"  (1.651 m)   Wt 223 lb 3.2 oz (101.2 kg)   LMP 10/14/2000   BMI 37.14 kg/m    Height: 5\' 5"  (165.1 cm)  Ht Readings from Last 3 Encounters:  04/18/18 5\' 5"  (1.651 m)  01/31/17 5' 4.75" (1.645 m)  07/06/16 5\' 5"  (1.651 m)    General appearance: alert, cooperative and appears stated age Head: Normocephalic, without obvious abnormality, atraumatic Neck: no adenopathy, supple, symmetrical, trachea midline and thyroid normal to inspection and palpation Lungs: clear to auscultation bilaterally Breasts: normal appearance, no masses or tenderness Heart: regular rate and rhythm Abdomen: soft, non-tender; bowel sounds normal; no masses,  no organomegaly Extremities: extremities normal, atraumatic, no cyanosis or edema Skin: Skin color, texture, turgor normal. No rashes or lesions Lymph  nodes: Cervical, supraclavicular, and axillary nodes normal. No abnormal inguinal nodes palpated Neurologic: Grossly normal   Pelvic: External genitalia:  no lesions              Urethra:  normal appearing urethra with no masses, tenderness or lesions              Bartholins and Skenes: normal                 Vagina: normal appearing vagina with normal color and discharge, no lesions              Cervix: absent              Pap taken: No. Bimanual Exam:  Uterus:  uterus absent              Adnexa: no mass, fullness, tenderness               Rectovaginal: Confirms               Anus:  normal sphincter tone, no lesions  Chaperone was present for exam.  A:  Well Woman with normal exam PMP, no HRT H/O TAH in 2002 due to fibroids Obesity Anxiety  P:   Mammogram guideliens reviewed.  Doing 3D due to breast density pap smear not indicated Compression stocking rx given to pt.  Desires one typically yearly Lab work done with Dr. Melford Aase.  Last was in 2017.   Vaccines are UTD Colonoscopy and BMD are UTD return annually or prn

## 2018-04-29 ENCOUNTER — Encounter

## 2018-05-01 ENCOUNTER — Ambulatory Visit: Payer: Self-pay | Admitting: Psychiatry

## 2018-05-03 DIAGNOSIS — S83411A Sprain of medial collateral ligament of right knee, initial encounter: Secondary | ICD-10-CM | POA: Diagnosis not present

## 2018-05-07 DIAGNOSIS — S83411A Sprain of medial collateral ligament of right knee, initial encounter: Secondary | ICD-10-CM | POA: Diagnosis not present

## 2018-05-08 DIAGNOSIS — M25552 Pain in left hip: Secondary | ICD-10-CM | POA: Diagnosis not present

## 2018-05-08 DIAGNOSIS — M25551 Pain in right hip: Secondary | ICD-10-CM | POA: Diagnosis not present

## 2018-05-13 DIAGNOSIS — H4312 Vitreous hemorrhage, left eye: Secondary | ICD-10-CM | POA: Diagnosis not present

## 2018-05-14 ENCOUNTER — Ambulatory Visit: Payer: Self-pay | Admitting: Psychiatry

## 2018-05-14 ENCOUNTER — Encounter: Payer: Self-pay | Admitting: Psychiatry

## 2018-05-14 ENCOUNTER — Ambulatory Visit (INDEPENDENT_AMBULATORY_CARE_PROVIDER_SITE_OTHER): Payer: BLUE CROSS/BLUE SHIELD | Admitting: Psychiatry

## 2018-05-14 DIAGNOSIS — S83411A Sprain of medial collateral ligament of right knee, initial encounter: Secondary | ICD-10-CM | POA: Diagnosis not present

## 2018-05-14 DIAGNOSIS — F411 Generalized anxiety disorder: Secondary | ICD-10-CM | POA: Diagnosis not present

## 2018-05-14 MED ORDER — BUSPIRONE HCL 15 MG PO TABS: 15.0000 mg | ORAL_TABLET | Freq: Two times a day (BID) | ORAL | 1 refills | Status: DC

## 2018-05-14 MED ORDER — CITALOPRAM HYDROBROMIDE 20 MG PO TABS: 20.0000 mg | ORAL_TABLET | Freq: Every day | ORAL | 1 refills | Status: DC

## 2018-05-14 MED ORDER — ALPRAZOLAM 0.5 MG PO TABS: 0.5000 mg | ORAL_TABLET | Freq: Two times a day (BID) | ORAL | 1 refills | Status: DC | PRN

## 2018-05-14 NOTE — Progress Notes (Signed)
ALEYDA GINDLESPERGER [id number] 1956/03/07 63 y.o.  Subjective:   Patient ID:  Bridget Joyce is a 63 y.o. (DOB [birth year]) female.  Chief Complaint:  Chief Complaint  Patient presents with  . Follow-up    Medication management    HPI Atisha Hamidi [facility] presents to the office today for follow-up of anxiety and mood.  Never really tried Buspar.  Felt she was doing ok with current meds.  Rarely used Xanax but wants it.  Patient reports stable mood and denies depressed or irritable moods.  Patient denies any recent difficulty with more than mild anxiety.  Patient denies difficulty with sleep initiation or maintenance. Denies appetite disturbance.  Patient reports that energy and motivation have been good.  Patient denies any difficulty with concentration.  Patient denies any suicidal ideation.  Work pretty good.  Review of Systems:  Review of Systems  Musculoskeletal: Positive for arthralgias.  Neurological: Positive for headaches. Negative for tremors and weakness.  Psychiatric/Behavioral: Negative for agitation, behavioral problems, confusion, decreased concentration, dysphoric mood, hallucinations, self-injury, sleep disturbance and suicidal ideas. The patient is not nervous/anxious and is not hyperactive.     Medications: I have reviewed the patient's current medications.  Current Outpatient Medications  Medication Sig Dispense Refill  . ALPRAZolam (XANAX) 0.5 MG tablet Take 1 tablet (0.5 mg total) by mouth 2 (two) times daily as needed for anxiety. 60 tablet 1  . Ascorbic Acid (VITAMIN C PO) Take by mouth daily.    . beclomethasone (QVAR REDIHALER) 40 MCG/ACT inhaler Take 1 puff by mouth daily.    . bumetanide (BUMEX) 1 MG tablet Take 1 tablet (1 mg total) by mouth daily as needed. For fluid. 30 tablet 2  . busPIRone (BUSPAR) 15 MG tablet Take 1 tablet (15 mg total) by mouth 2 (two) times daily. 180 tablet 1  . citalopram (CELEXA) 20 MG tablet Take 1 tablet (20 mg total) by mouth  daily. 90 tablet 1  . PROAIR HFA 108 (90 Base) MCG/ACT inhaler      No current facility-administered medications for this visit.     Medication Side Effects: None  Allergies:  Allergies  Allergen Reactions  . [place] Grass Extract Itching  . Mold Extract  [Trichophyton] Itching    Past Medical History:  Diagnosis Date  . Allergy    rag weed, pollen,dust  . Ankle fracture 12/2017   Left   . Anxiety    mild; infrequent per patient  . Asthma   . Bursitis of hip   . Diverticulosis   . External hemorrhoids   . Fibroid   . Fibromyalgia   . GERD (gastroesophageal reflux disease)   . Obesity   . Peripheral edema    infrequent per patient  . Prediabetes   . Vitamin D deficiency     Family History  Problem Relation Age of Onset  . Hypertension Father   . Heart disease Father   . Diabetes Father   . Heart failure Father   . Uterine cancer Sister   . Diabetes Sister   . Stroke Mother   . Colon cancer Neg Hx   . Esophageal cancer Neg Hx   . Stomach cancer Neg Hx   . Rectal cancer Neg Hx     Social History   Socioeconomic History  . Marital status: Single    Spouse name: Not on file  . Number of children: Not on file  . Years of education: Not on file  . Highest education level: Not on  file  Occupational History  . Occupation: Event organiser: [employer]  Social Needs  . Financial resource strain: Not on file  . Food insecurity:    Worry: Not on file    Inability: Not on file  . Transportation needs:    Medical: Not on file    Non-medical: Not on file  Tobacco Use  . Smoking status: Never Smoker  . Smokeless tobacco: Never Used  Substance and Sexual Activity  . Alcohol use: No  . Drug use: No  . Sexual activity: Not Currently    Birth control/protection: Abstinence  Lifestyle  . Physical activity:    Days per week: Not on file    Minutes per session: Not on file  . Stress: Not on file  Relationships  . Social connections:    Talks  on phone: Not on file    Gets together: Not on file    Attends religious service: Not on file    Active member of club or organization: Not on file    Attends meetings of clubs or organizations: Not on file    Relationship status: Not on file  . Intimate partner violence:    Fear of current or ex partner: Not on file    Emotionally abused: Not on file    Physically abused: Not on file    Forced sexual activity: Not on file  Other Topics Concern  . Not on file  Social History Narrative  . Not on file    Past Medical History, Surgical history, Social history, and Family history were reviewed and updated as appropriate.   Please see review of systems for further details on the patient's review from today.   Objective:   Physical Exam:  LMP 10/14/2000   Physical Exam Constitutional:      General: She is not in acute distress.    Appearance: She is well-developed. She is obese.  Musculoskeletal:        General: No deformity.  Neurological:     Mental Status: She is alert and oriented to person, place, and time.     Motor: No tremor.     Coordination: Coordination normal.     Gait: Gait normal.  Psychiatric:        Attention and Perception: Attention and perception normal.        Mood and Affect: Mood is anxious. Mood is not depressed. Affect is not labile, blunt, angry or inappropriate.        Speech: Speech normal.        Behavior: Behavior normal.        Thought Content: Thought content normal. Thought content does not include homicidal or suicidal ideation. Thought content does not include homicidal or suicidal plan.        Cognition and Memory: Cognition normal.        Judgment: Judgment normal.     Comments: Insight intact. No auditory or visual hallucinations. No delusions.      Lab Review:     Component Value Date/Time   NA 141 03/07/2016 1651   K 3.9 03/07/2016 1651   CL 101 03/07/2016 1651   CO2 29 03/07/2016 1651   GLUCOSE 80 03/07/2016 1651   BUN 11  03/07/2016 1651   CREATININE 0.97 03/07/2016 1651   CALCIUM 9.5 03/07/2016 1651   PROT 7.2 03/07/2016 1651   ALBUMIN 4.6 03/07/2016 1651   AST 24 03/07/2016 1651   ALT 26 03/07/2016 1651   ALKPHOS 97 03/07/2016  1651   BILITOT 0.5 03/07/2016 1651   GFRNONAA 64 03/07/2016 1651   GFRAA 74 03/07/2016 1651       Component Value Date/Time   WBC 7.4 03/07/2016 1651   RBC 4.32 03/07/2016 1651   HGB 13.5 03/07/2016 1651   HCT 40.1 03/07/2016 1651   PLT 351 03/07/2016 1651   MCV 92.8 03/07/2016 1651   MCH 31.3 03/07/2016 1651   MCHC 33.7 03/07/2016 1651   RDW 13.5 03/07/2016 1651   LYMPHSABS 2,146 03/07/2016 1651   MONOABS 518 03/07/2016 1651   EOSABS 148 03/07/2016 1651   BASOSABS 0 03/07/2016 1651    No results found for: POCLITH, LITHIUM   No results found for: PHENYTOIN, PHENOBARB, VALPROATE, CBMZ   .res Assessment: Plan:    Generalized anxiety disorder  Dysthymia  Overall satisfied with meds.  Option as discussed to increase the Buspar for anxiety if needed.  FU 6 mos  Lynder Parents, MD, DFAPA   Please see After Visit Summary for patient specific instructions.  Future Appointments  Date Time Provider [place]  08/27/2019  9:00 AM Megan Salon, MD [place] None    No orders of the defined types were placed in this encounter.     -------------------------------

## 2018-05-21 DIAGNOSIS — S83411A Sprain of medial collateral ligament of right knee, initial encounter: Secondary | ICD-10-CM | POA: Diagnosis not present

## 2018-05-26 DIAGNOSIS — M25572 Pain in left ankle and joints of left foot: Secondary | ICD-10-CM | POA: Diagnosis not present

## 2018-05-28 DIAGNOSIS — S83411A Sprain of medial collateral ligament of right knee, initial encounter: Secondary | ICD-10-CM | POA: Diagnosis not present

## 2018-06-04 DIAGNOSIS — S83411A Sprain of medial collateral ligament of right knee, initial encounter: Secondary | ICD-10-CM | POA: Diagnosis not present

## 2018-07-12 DIAGNOSIS — M25551 Pain in right hip: Secondary | ICD-10-CM | POA: Diagnosis not present

## 2018-07-12 DIAGNOSIS — M25552 Pain in left hip: Secondary | ICD-10-CM | POA: Diagnosis not present

## 2018-08-05 ENCOUNTER — Telehealth: Payer: Self-pay

## 2018-08-05 NOTE — Telephone Encounter (Signed)
Pt wanted to know if she est. With Einar Pheasant when and if Tabori states taking NP will she be willing to except her?  I informed the pt that we usually do not due a TOC within the same office. She wanted to know if I would make an exception and asked before she schedules a NP appt. Please advise.

## 2018-08-05 NOTE — Telephone Encounter (Signed)
LMOVM making pt aware and offered to assist pt in scheduling with [facility] or another [facility] office.

## 2018-08-05 NOTE — Telephone Encounter (Signed)
We do not do TOC between providers within the same office here. If she establishes with me, she will stay my patient.  If Birdie Riddle is willing to work her in, then she can establish with her.

## 2018-08-05 NOTE — Telephone Encounter (Signed)
Called pt back to again advise to KT in not accepting NP and that if she wanted to schedule with Einar Pheasant that she would need to stay with Einar Pheasant in our practice, pt stated that she would wait and maybe call back at a later time to if she could schedule with KT.

## 2018-08-05 NOTE — Telephone Encounter (Signed)
Copied from [place] [phone]. Topic: Appointment Scheduling - New Patient >> Jul 07, 2018  2:14 PM Alanda Slim E wrote: New patient would like to be scheduled for your office. Provider: Dr. Birdie Riddle (Pt would really like to see her specifically) /has [employer]/  please advise  Date of Appointment: >> Jul 07, 2018  3:02 PM Katina Dung, CMA wrote: Routing to provider to see if she is open for patient to establish. >> Aug 04, 2018  4:18 PM Midge Minium, MD wrote: Karen Kays to accept at this time

## 2018-09-05 DIAGNOSIS — M25552 Pain in left hip: Secondary | ICD-10-CM | POA: Diagnosis not present

## 2018-09-05 DIAGNOSIS — M25551 Pain in right hip: Secondary | ICD-10-CM | POA: Diagnosis not present

## 2018-09-10 ENCOUNTER — Ambulatory Visit: Payer: BLUE CROSS/BLUE SHIELD | Admitting: Psychiatry

## 2018-09-10 ENCOUNTER — Encounter: Payer: Self-pay | Admitting: Psychiatry

## 2018-09-10 ENCOUNTER — Other Ambulatory Visit: Payer: Self-pay

## 2018-09-10 DIAGNOSIS — F341 Dysthymic disorder: Secondary | ICD-10-CM | POA: Diagnosis not present

## 2018-09-10 DIAGNOSIS — F411 Generalized anxiety disorder: Secondary | ICD-10-CM | POA: Diagnosis not present

## 2018-09-10 NOTE — Progress Notes (Addendum)
Bridget Joyce [facility] [id number] 1955/07/22 63 y.o.   Virtual Visit via Telephone Note  I connected with pt by telephone and verified that I am speaking with the correct person using two identifiers.   I discussed the limitations, risks, security and privacy concerns of performing an evaluation and management service by telephone and the availability of in person appointments. I also discussed with the patient that there may be a patient responsible charge related to this service. The patient expressed understanding and agreed to proceed.  I discussed the assessment and treatment plan with the patient. The patient was provided an opportunity to ask questions and all were answered. The patient agreed with the plan and demonstrated an understanding of the instructions.   The patient was advised to call back or seek an in-person evaluation if the symptoms worsen or if the condition fails to improve as anticipated.  I provided 15 minutes of non-face-to-face time during this encounter. The call started at 1:00 and ended at 115. The patient was located at home and the provider was located office.   Subjective:   Patient ID:  Bridget Joyce is a 63 y.o. (DOB Jun 28, 1955) female.  Chief Complaint:  Chief Complaint  Patient presents with  . Follow-up    med management  . Anxiety    HPI Bridget Joyce presents  today for follow-up of anxiety and mood.  Last visit January 5.  No meds were changed.  Never really tried Buspar.  Felt she was doing ok with current meds.  Rarely used Xanax but wants it.  Pretty stressful with coworker who is not working like they should.  Working from home and busy.  Mood fairly well.  Patient reports stable mood and denies depressed or irritable moods.  Patient denies any recent difficulty with more than mild anxiety.  Patient denies difficulty with sleep initiation or maintenance. Denies appetite disturbance.  Patient reports that energy and motivation have been  good.  Patient denies any difficulty with concentration.  Patient denies any suicidal ideation.  Work pretty good.  Past Psychiatric Medication Trials:  Wellbutrin anxiety, sertraline tremor, Paxil sweats, citalopram, buspirone, Xanax, propranolol, Lyrica, Trintellix, gabapentin, fluoxetine, Effexor 150, mirtazapine, Adderall, duloxetine   Review of Systems:  Review of Systems  Musculoskeletal: Positive for arthralgias.  Neurological: Positive for headaches. Negative for tremors and weakness.  Psychiatric/Behavioral: Negative for agitation, behavioral problems, confusion, decreased concentration, dysphoric mood, hallucinations, self-injury, sleep disturbance and suicidal ideas. The patient is not nervous/anxious and is not hyperactive.     Medications: I have reviewed the patient's current medications.  Current Outpatient Medications  Medication Sig Dispense Refill  . ALPRAZolam (XANAX) 0.5 MG tablet Take 1 tablet (0.5 mg total) by mouth 2 (two) times daily as needed for anxiety. 60 tablet 1  . Ascorbic Acid (VITAMIN C PO) Take by mouth daily.    . beclomethasone (QVAR REDIHALER) 40 MCG/ACT inhaler Take 1 puff by mouth daily.    . bumetanide (BUMEX) 1 MG tablet Take 1 tablet (1 mg total) by mouth daily as needed. For fluid. 30 tablet 2  . busPIRone (BUSPAR) 15 MG tablet Take 1 tablet (15 mg total) by mouth 2 (two) times daily. (Patient taking differently: Take 15 mg by mouth at bedtime. ) 180 tablet 1  . citalopram (CELEXA) 20 MG tablet Take 1 tablet (20 mg total) by mouth daily. 90 tablet 1  . PROAIR HFA 108 (90 Base) MCG/ACT inhaler      No current facility-administered medications for  this visit.     Medication Side Effects: None  Allergies:  Allergies  Allergen Reactions  . Guatemala Grass Extract Itching  . Mold Extract  [Trichophyton] Itching    Past Medical History:  Diagnosis Date  . Allergy    rag weed, pollen,dust  . Ankle fracture 12/2017   Left   . Anxiety     mild; infrequent per patient  . Asthma   . Bursitis of hip   . Diverticulosis   . External hemorrhoids   . Fibroid   . Fibromyalgia   . GERD (gastroesophageal reflux disease)   . Obesity   . Peripheral edema    infrequent per patient  . Prediabetes   . Vitamin D deficiency     Family History  Problem Relation Age of Onset  . Hypertension Father   . Heart disease Father   . Diabetes Father   . Heart failure Father   . Uterine cancer Sister   . Diabetes Sister   . Stroke Mother   . Colon cancer Neg Hx   . Esophageal cancer Neg Hx   . Stomach cancer Neg Hx   . Rectal cancer Neg Hx     Social History   Socioeconomic History  . Marital status: Single    Spouse name: Not on file  . Number of children: Not on file  . Years of education: Not on file  . Highest education level: Not on file  Occupational History  . Occupation: Event organiser: [employer]  Social Needs  . Financial resource strain: Not on file  . Food insecurity:    Worry: Not on file    Inability: Not on file  . Transportation needs:    Medical: Not on file    Non-medical: Not on file  Tobacco Use  . Smoking status: Never Smoker  . Smokeless tobacco: Never Used  Substance and Sexual Activity  . Alcohol use: No  . Drug use: No  . Sexual activity: Not Currently    Birth control/protection: Abstinence  Lifestyle  . Physical activity:    Days per week: Not on file    Minutes per session: Not on file  . Stress: Not on file  Relationships  . Social connections:    Talks on phone: Not on file    Gets together: Not on file    Attends religious service: Not on file    Active member of club or organization: Not on file    Attends meetings of clubs or organizations: Not on file    Relationship status: Not on file  . Intimate partner violence:    Fear of current or ex partner: Not on file    Emotionally abused: Not on file    Physically abused: Not on file    Forced sexual activity:  Not on file  Other Topics Concern  . Not on file  Social History Narrative  . Not on file    Past Medical History, Surgical history, Social history, and Family history were reviewed and updated as appropriate.   Please see review of systems for further details on the patient's review from today.   Objective:   Physical Exam:  LMP 10/14/2000   Physical Exam Constitutional:      General: She is not in acute distress.    Appearance: She is well-developed. She is obese.  Musculoskeletal:        General: No deformity.  Neurological:     Mental Status: She is  alert and oriented to person, place, and time.     Motor: No tremor.     Coordination: Coordination normal.     Gait: Gait normal.  Psychiatric:        Attention and Perception: Attention and perception normal.        Mood and Affect: Mood is anxious. Mood is not depressed. Affect is not labile, blunt, angry or inappropriate.        Speech: Speech normal.        Behavior: Behavior normal.        Thought Content: Thought content normal. Thought content does not include homicidal or suicidal ideation. Thought content does not include homicidal or suicidal plan.        Cognition and Memory: Cognition normal.        Judgment: Judgment normal.     Comments: Insight intact. No auditory or visual hallucinations. No delusions.      Lab Review:     Component Value Date/Time   NA 141 03/07/2016 1651   K 3.9 03/07/2016 1651   CL 101 03/07/2016 1651   CO2 29 03/07/2016 1651   GLUCOSE 80 03/07/2016 1651   BUN 11 03/07/2016 1651   CREATININE 0.97 03/07/2016 1651   CALCIUM 9.5 03/07/2016 1651   PROT 7.2 03/07/2016 1651   ALBUMIN 4.6 03/07/2016 1651   AST 24 03/07/2016 1651   ALT 26 03/07/2016 1651   ALKPHOS 97 03/07/2016 1651   BILITOT 0.5 03/07/2016 1651   GFRNONAA 64 03/07/2016 1651   GFRAA 74 03/07/2016 1651       Component Value Date/Time   WBC 7.4 03/07/2016 1651   RBC 4.32 03/07/2016 1651   HGB 13.5 03/07/2016  1651   HCT 40.1 03/07/2016 1651   PLT 351 03/07/2016 1651   MCV 92.8 03/07/2016 1651   MCH 31.3 03/07/2016 1651   MCHC 33.7 03/07/2016 1651   RDW 13.5 03/07/2016 1651   LYMPHSABS 2,146 03/07/2016 1651   MONOABS 518 03/07/2016 1651   EOSABS 148 03/07/2016 1651   BASOSABS 0 03/07/2016 1651    No results found for: POCLITH, LITHIUM   No results found for: PHENYTOIN, PHENOBARB, VALPROATE, CBMZ   .res Assessment: Plan:    Generalized anxiety disorder  Dysthymia  Dysthymia  Supportive therapy.  Overall satisfied with meds.  Option as discussed to increase the Buspar for anxiety if needed.  She is taking a very low dosage.  As can been seen before above she has tried multiple other psych meds.  Disc SE.    FU 6 mos  Lynder Parents, MD, DFAPA   Please see After Visit Summary for patient specific instructions.  Future Appointments  Date Time Provider [place]  08/27/2019  9:00 AM Megan Salon, MD [place] None    No orders of the defined types were placed in this encounter.     -------------------------------

## 2018-09-26 DIAGNOSIS — M25552 Pain in left hip: Secondary | ICD-10-CM | POA: Diagnosis not present

## 2018-09-26 DIAGNOSIS — M25551 Pain in right hip: Secondary | ICD-10-CM | POA: Diagnosis not present

## 2018-10-03 DIAGNOSIS — D692 Other nonthrombocytopenic purpura: Secondary | ICD-10-CM | POA: Diagnosis not present

## 2018-10-03 DIAGNOSIS — L573 Poikiloderma of Civatte: Secondary | ICD-10-CM | POA: Diagnosis not present

## 2018-10-03 DIAGNOSIS — L570 Actinic keratosis: Secondary | ICD-10-CM | POA: Diagnosis not present

## 2018-10-03 DIAGNOSIS — L814 Other melanin hyperpigmentation: Secondary | ICD-10-CM | POA: Diagnosis not present

## 2018-10-10 DIAGNOSIS — M25522 Pain in left elbow: Secondary | ICD-10-CM | POA: Diagnosis not present

## 2018-10-10 DIAGNOSIS — M1812 Unilateral primary osteoarthritis of first carpometacarpal joint, left hand: Secondary | ICD-10-CM | POA: Diagnosis not present

## 2018-10-10 DIAGNOSIS — M7712 Lateral epicondylitis, left elbow: Secondary | ICD-10-CM | POA: Diagnosis not present

## 2018-10-10 DIAGNOSIS — M79645 Pain in left finger(s): Secondary | ICD-10-CM | POA: Diagnosis not present

## 2018-10-21 DIAGNOSIS — H2513 Age-related nuclear cataract, bilateral: Secondary | ICD-10-CM | POA: Diagnosis not present

## 2018-10-21 DIAGNOSIS — H5213 Myopia, bilateral: Secondary | ICD-10-CM | POA: Diagnosis not present

## 2018-10-21 DIAGNOSIS — H5010 Unspecified exotropia: Secondary | ICD-10-CM | POA: Diagnosis not present

## 2018-10-21 DIAGNOSIS — H43812 Vitreous degeneration, left eye: Secondary | ICD-10-CM | POA: Diagnosis not present

## 2018-11-07 DIAGNOSIS — M25552 Pain in left hip: Secondary | ICD-10-CM | POA: Diagnosis not present

## 2018-11-07 DIAGNOSIS — M25551 Pain in right hip: Secondary | ICD-10-CM | POA: Diagnosis not present

## 2018-12-29 ENCOUNTER — Other Ambulatory Visit: Payer: Self-pay | Admitting: Psychiatry

## 2018-12-31 DIAGNOSIS — M79662 Pain in left lower leg: Secondary | ICD-10-CM | POA: Diagnosis not present

## 2019-02-05 DIAGNOSIS — M79672 Pain in left foot: Secondary | ICD-10-CM | POA: Diagnosis not present

## 2019-02-05 DIAGNOSIS — M19071 Primary osteoarthritis, right ankle and foot: Secondary | ICD-10-CM | POA: Diagnosis not present

## 2019-02-05 DIAGNOSIS — M79674 Pain in right toe(s): Secondary | ICD-10-CM | POA: Diagnosis not present

## 2019-02-05 DIAGNOSIS — M79675 Pain in left toe(s): Secondary | ICD-10-CM | POA: Diagnosis not present

## 2019-02-05 DIAGNOSIS — M19072 Primary osteoarthritis, left ankle and foot: Secondary | ICD-10-CM | POA: Diagnosis not present

## 2019-02-05 DIAGNOSIS — M79671 Pain in right foot: Secondary | ICD-10-CM | POA: Diagnosis not present

## 2019-03-12 ENCOUNTER — Ambulatory Visit: Payer: BLUE CROSS/BLUE SHIELD | Admitting: Psychiatry

## 2019-04-13 ENCOUNTER — Other Ambulatory Visit: Payer: Self-pay | Admitting: Obstetrics & Gynecology

## 2019-04-13 DIAGNOSIS — Z1231 Encounter for screening mammogram for malignant neoplasm of breast: Secondary | ICD-10-CM

## 2019-04-27 ENCOUNTER — Other Ambulatory Visit: Payer: Self-pay

## 2019-04-27 ENCOUNTER — Ambulatory Visit (INDEPENDENT_AMBULATORY_CARE_PROVIDER_SITE_OTHER): Payer: BC Managed Care – PPO | Admitting: Psychiatry

## 2019-04-27 ENCOUNTER — Encounter: Payer: Self-pay | Admitting: Psychiatry

## 2019-04-27 DIAGNOSIS — F341 Dysthymic disorder: Secondary | ICD-10-CM

## 2019-04-27 DIAGNOSIS — F411 Generalized anxiety disorder: Secondary | ICD-10-CM

## 2019-04-27 NOTE — Progress Notes (Signed)
[name] [id number] 1955/06/28 63 y.o.     Subjective:   Patient ID:  Bridget Joyce is a 63 y.o. (DOB [birth year]) female.  Chief Complaint:  Chief Complaint  Patient presents with  . Follow-up     Medication Mangement  . Anxiety     Medication Mangement    HPI [name] presents  today for follow-up of anxiety and mood.  Last visit May 2020.  No meds were changed.  Some concerns about Covid and asked questions about it.  A lot of hand washing and sanitizer.    Decided to try to stop citalopram bc gained a lot of weight in the time since being on it.     Rarely used Xanax but wants it.   Has been well since June 2019 when had pneumonia probably.    Pretty stressful with coworker who is not working like they should.  Working from home and busy.  Mood fairly well.  Patient reports stable mood and denies depressed or irritable moods.  Patient denies any recent difficulty with more than mild anxiety.  Patient denies difficulty with sleep initiation or maintenance. Denies appetite disturbance.  Patient reports that energy and motivation have been good.  Patient denies any difficulty with concentration.  Patient denies any suicidal ideation.  Work pretty good.   Past Psychiatric Medication Trials:  Wellbutrin anxiety, sertraline tremor, Paxil sweats, citalopram,  buspirone, Xanax, propranolol, Lyrica, Trintellix, gabapentin, fluoxetine, Effexor 150, mirtazapine, Adderall, duloxetine  Review of Systems:  Review of Systems  Musculoskeletal: Positive for arthralgias.  Neurological: Positive for headaches. Negative for dizziness, tremors and weakness.  Psychiatric/Behavioral: Negative for agitation, behavioral problems, confusion, decreased concentration, dysphoric mood, hallucinations, self-injury, sleep disturbance and suicidal ideas. The patient is not nervous/anxious and is not hyperactive.     Medications: I have reviewed the patient's current  medications.  Current Outpatient Medications  Medication Sig Dispense Refill  . ALPRAZolam (XANAX) 0.5 MG tablet Take 1 tablet (0.5 mg total) by mouth 2 (two) times daily as needed for anxiety. 60 tablet 1  . Ascorbic Acid (VITAMIN C PO) Take by mouth daily.    . beclomethasone (QVAR REDIHALER) 40 MCG/ACT inhaler Take 1 puff by mouth daily.    . bumetanide (BUMEX) 1 MG tablet Take 1 tablet (1 mg total) by mouth daily as needed. For fluid. 30 tablet 2  . busPIRone (BUSPAR) 15 MG tablet Take 1 tablet (15 mg total) by mouth 2 (two) times daily. (Patient taking differently: Take 15 mg by mouth at bedtime. ) 180 tablet 1  . citalopram (CELEXA) 20 MG tablet TAKE 1 TABLET BY MOUTH EVERY DAY 90 tablet 1  . PROAIR HFA 108 (90 Base) MCG/ACT inhaler      No current facility-administered medications for this visit.    Medication Side Effects: None  Allergies:  Allergies  Allergen Reactions  . Guatemala Grass Extract Itching  . Mold Extract  [Trichophyton] Itching    Past Medical History:  Diagnosis Date  . Allergy    rag weed, pollen,dust  . Ankle fracture 12/2017   Left   . Anxiety    mild; infrequent per patient  . Asthma   . Bursitis of hip   . Diverticulosis   . External hemorrhoids   . Fibroid   . Fibromyalgia   . GERD (gastroesophageal reflux disease)   . Obesity   . Peripheral edema    infrequent per patient  . Prediabetes   . Vitamin D deficiency  Family History  Problem Relation Age of Onset  . Hypertension Father   . Heart disease Father   . Diabetes Father   . Heart failure Father   . Uterine cancer Sister   . Diabetes Sister   . Stroke Mother   . Colon cancer Neg Hx   . Esophageal cancer Neg Hx   . Stomach cancer Neg Hx   . Rectal cancer Neg Hx     Social History   Socioeconomic History  . Marital status: Single    Spouse name: Not on file  . Number of children: Not on file  . Years of education: Not on file  . Highest education level: Not on file   Occupational History  . Occupation: Event organiser: [employer]  Tobacco Use  . Smoking status: Never Smoker  . Smokeless tobacco: Never Used  Substance and Sexual Activity  . Alcohol use: No  . Drug use: No  . Sexual activity: Not Currently    Birth control/protection: Abstinence  Other Topics Concern  . Not on file  Social History Narrative  . Not on file   Social Determinants of Health   Financial Resource Strain:   . Difficulty of Paying Living Expenses: Not on file  Food Insecurity:   . Worried About Charity fundraiser in the Last Year: Not on file  . Ran Out of Food in the Last Year: Not on file  Transportation Needs:   . Lack of Transportation (Medical): Not on file  . Lack of Transportation (Non-Medical): Not on file  Physical Activity:   . Days of Exercise per Week: Not on file  . Minutes of Exercise per Session: Not on file  Stress:   . Feeling of Stress : Not on file  Social Connections:   . Frequency of Communication with Friends and Family: Not on file  . Frequency of Social Gatherings with Friends and Family: Not on file  . Attends Religious Services: Not on file  . Active Member of Clubs or Organizations: Not on file  . Attends Archivist Meetings: Not on file  . Marital Status: Not on file  Intimate Partner Violence:   . Fear of Current or Ex-Partner: Not on file  . Emotionally Abused: Not on file  . Physically Abused: Not on file  . Sexually Abused: Not on file    Past Medical History, Surgical history, Social history, and Family history were reviewed and updated as appropriate.   Please see review of systems for further details on the patient's review from today.   Objective:   Physical Exam:  LMP 10/14/2000   Physical Exam Constitutional:      General: She is not in acute distress.    Appearance: She is well-developed. She is obese.  Musculoskeletal:        General: No deformity.  Neurological:     Mental  Status: She is alert and oriented to person, place, and time.     Motor: No tremor.     Coordination: Coordination normal.     Gait: Gait normal.  Psychiatric:        Attention and Perception: Attention and perception normal.        Mood and Affect: Mood is anxious. Mood is not depressed. Affect is not labile, blunt, angry or inappropriate.        Speech: Speech normal.        Behavior: Behavior normal.        Thought  Content: Thought content normal. Thought content does not include homicidal or suicidal ideation. Thought content does not include homicidal or suicidal plan.        Cognition and Memory: Cognition normal.        Judgment: Judgment normal.     Comments: Insight intact. No auditory or visual hallucinations. No delusions.      Lab Review:     Component Value Date/Time   NA 141 03/07/2016 1651   K 3.9 03/07/2016 1651   CL 101 03/07/2016 1651   CO2 29 03/07/2016 1651   GLUCOSE 80 03/07/2016 1651   BUN 11 03/07/2016 1651   CREATININE 0.97 03/07/2016 1651   CALCIUM 9.5 03/07/2016 1651   PROT 7.2 03/07/2016 1651   ALBUMIN 4.6 03/07/2016 1651   AST 24 03/07/2016 1651   ALT 26 03/07/2016 1651   ALKPHOS 97 03/07/2016 1651   BILITOT 0.5 03/07/2016 1651   GFRNONAA 64 03/07/2016 1651   GFRAA 74 03/07/2016 1651       Component Value Date/Time   WBC 7.4 03/07/2016 1651   RBC 4.32 03/07/2016 1651   HGB 13.5 03/07/2016 1651   HCT 40.1 03/07/2016 1651   PLT 351 03/07/2016 1651   MCV 92.8 03/07/2016 1651   MCH 31.3 03/07/2016 1651   MCHC 33.7 03/07/2016 1651   RDW 13.5 03/07/2016 1651   LYMPHSABS 2,146 03/07/2016 1651   MONOABS 518 03/07/2016 1651   EOSABS 148 03/07/2016 1651   BASOSABS 0 03/07/2016 1651    No results found for: POCLITH, LITHIUM   No results found for: PHENYTOIN, PHENOBARB, VALPROATE, CBMZ   .res Assessment: Plan:    Generalized anxiety disorder  Dysthymia    Greater than 50% of 30 min face to face time with patient was spent on  counseling and coordination of care. We discussed Option as discussed to increase the Buspar for anxiety if needed.  She is taking a very low dosage.  Option increase if needed.  Disc risk of relapse off citalopram. She wants to wean off for weight. Especially risk of anxiety.  Disc mechanism of action of SSRI vs buspirone.   Best alternative would be Lexapro if needed.  She's going to call if needed.  Disc diet and avoiding sugar.  Answered questions about diet pills at length including phentermine and Saxenda.  Consider naltrexone off label.  None Rx today  As can been seen before above she has tried multiple other psych meds.  Disc SE.    FU 6 mos  Lynder Parents, MD, DFAPA   Please see After Visit Summary for patient specific instructions.  Future Appointments  Date Time Provider [place]  06/02/2019  1:00 PM [facility]  08/27/2019  9:00 AM Megan Salon, MD [place] None    No orders of the defined types were placed in this encounter.     -------------------------------

## 2019-04-29 ENCOUNTER — Other Ambulatory Visit: Payer: Self-pay

## 2019-05-04 ENCOUNTER — Other Ambulatory Visit: Payer: Self-pay

## 2019-05-04 ENCOUNTER — Ambulatory Visit (INDEPENDENT_AMBULATORY_CARE_PROVIDER_SITE_OTHER): Payer: BC Managed Care – PPO | Admitting: Obstetrics & Gynecology

## 2019-05-04 ENCOUNTER — Encounter: Payer: Self-pay | Admitting: Obstetrics & Gynecology

## 2019-05-04 VITALS — BP 122/74 | HR 76 | Temp 97.5°F | Ht 65.0 in | Wt 230.0 lb

## 2019-05-04 DIAGNOSIS — Z01419 Encounter for gynecological examination (general) (routine) without abnormal findings: Secondary | ICD-10-CM

## 2019-05-04 DIAGNOSIS — Z0000 Encounter for general adult medical examination without abnormal findings: Secondary | ICD-10-CM

## 2019-05-04 LAB — POCT URINALYSIS DIPSTICK
Bilirubin, UA: NEGATIVE
Blood, UA: NEGATIVE
Glucose, UA: POSITIVE — AB
Ketones, UA: NEGATIVE
Leukocytes, UA: NEGATIVE
Nitrite, UA: NEGATIVE
Protein, UA: NEGATIVE
Urobilinogen, UA: 0.2 E.U./dL
pH, UA: 5 (ref 5.0–8.0)

## 2019-05-04 NOTE — Progress Notes (Signed)
64 y.o. G0P0 Single White or Caucasian female here for annual exam.  Requested urine testing today.  Glucose was noted in urine today.  Has a coke for lunch.  Having some increased urgency.    Denies vaginal bleeding.  Is going to return for fasting lab work.    She is weaning off her citalopram.  She is followed by Dr. Clovis Pu for this.  PCP:  Not one.  Has a provider at work that she can see.  She is considering establishing care with new provider in 2021.  Patient's last menstrual period was 10/14/2000.           Sexually active: No.  The current method of family planning is status post hysterectomy.    Exercising: No.  The patient does not participate in regular exercise at present. Smoker:  no  Health Maintenance: Pap:  01/31/17 Neg             07/05/14 Neg History of abnormal Pap:  no MMG:  03/20/18 BIRADS 1 negative/density c -- scheduled 06/02/19 Colonoscopy:  01/08/12 polypoid tissue.  Follow up in 10 years.  Done with Dr. Carlean Purl BMD:   03/15/17 Normal TDaP:  2014 Pneumonia vaccine(s):  n/a Shingrix:   Completed  Hep C testing: has donated blood in the past Screening Labs: discuss today   reports that she has never smoked. She has never used smokeless tobacco. She reports that she does not drink alcohol or use drugs.  Past Medical History:  Diagnosis Date  . Allergy    rag weed, pollen,dust  . Ankle fracture 12/2017   Left   . Anxiety    mild; infrequent per patient  . Asthma   . Bursitis of hip   . Diverticulosis   . External hemorrhoids   . Fibroid   . Fibromyalgia   . GERD (gastroesophageal reflux disease)   . Obesity   . Peripheral edema    infrequent per patient  . Prediabetes   . Vitamin D deficiency     Past Surgical History:  Procedure Laterality Date  . ABDOMINAL HYSTERECTOMY  2002   ovaries remain  . COLONOSCOPY  09/02/2006   external hemorrhoids,diverticulosis  . KNEE SURGERY     Left  . LIPOMA EXCISION     Labial  . MANDIBLE SURGERY    .  MOUTH SURGERY    . REPAIR ANKLE LIGAMENT  04-05-2014   x2  . TONSILLECTOMY      Current Outpatient Medications  Medication Sig Dispense Refill  . ALPRAZolam (XANAX) 0.5 MG tablet Take 1 tablet (0.5 mg total) by mouth 2 (two) times daily as needed for anxiety. 60 tablet 1  . Ascorbic Acid (VITAMIN C PO) Take by mouth daily.    . beclomethasone (QVAR REDIHALER) 40 MCG/ACT inhaler Take 1 puff by mouth daily.    . bumetanide (BUMEX) 1 MG tablet Take 1 tablet (1 mg total) by mouth daily as needed. For fluid. 30 tablet 2  . busPIRone (BUSPAR) 15 MG tablet Take 1 tablet (15 mg total) by mouth 2 (two) times daily. (Patient taking differently: Take 15 mg by mouth at bedtime. ) 180 tablet 1  . CALCIUM PO Take by mouth.    . citalopram (CELEXA) 20 MG tablet TAKE 1 TABLET BY MOUTH EVERY DAY (Patient taking differently: Take 10 mg by mouth daily. Been doing for 2 weeks) 90 tablet 1  . magnesium 30 MG tablet Take 30 mg by mouth 2 (two) times daily.    Marland Kitchen  Multiple Vitamin (MULTIVITAMIN) tablet Take 1 tablet by mouth daily.    . Omega-3 Fatty Acids (FISH OIL) 1000 MG CAPS Take by mouth.    Marland Kitchen PROAIR HFA 108 (90 Base) MCG/ACT inhaler      No current facility-administered medications for this visit.    Family History  Problem Relation Age of Onset  . Hypertension Father   . Heart disease Father   . Diabetes Father   . Heart failure Father   . Uterine cancer Sister   . Diabetes Sister   . Stroke Mother   . Colon cancer Neg Hx   . Esophageal cancer Neg Hx   . Stomach cancer Neg Hx   . Rectal cancer Neg Hx     Review of Systems  All other systems reviewed and are negative.   Exam:   BP 122/74 (BP Location: Right Arm, Patient Position: Sitting, Cuff Size: Large)   Pulse 76   Temp (!) 97.5 F (36.4 C) (Temporal)   Ht 5\' 5"  (1.651 m)   Wt 230 lb (104.3 kg)   LMP 10/14/2000   BMI 38.27 kg/m   Height: 5\' 5"  (165.1 cm)  Ht Readings from Last 3 Encounters:  05/04/19 5\' 5"  (1.651 m)  04/18/18  5\' 5"  (1.651 m)  01/31/17 5' 4.75" (1.645 m)   General appearance: alert, cooperative and appears stated age Head: Normocephalic, without obvious abnormality, atraumatic Neck: no adenopathy, supple, symmetrical, trachea midline and thyroid normal to inspection and palpation Lungs: clear to auscultation bilaterally Breasts: normal appearance, no masses or tenderness Heart: regular rate and rhythm Abdomen: soft, non-tender; bowel sounds normal; no masses,  no organomegaly Extremities: extremities normal, atraumatic, no cyanosis or edema Skin: Skin color, texture, turgor normal. No rashes or lesions Lymph nodes: Cervical, supraclavicular, and axillary nodes normal. No abnormal inguinal nodes palpated Neurologic: Grossly normal  Pelvic: External genitalia:  no lesions              Urethra:  normal appearing urethra with no masses, tenderness or lesions              Bartholins and Skenes: normal                 Vagina: normal appearing vagina with normal color and discharge, no lesions              Cervix: absent              Pap taken: No. Bimanual Exam:  Uterus:  uterus absent              Adnexa: normal adnexa               Rectovaginal: Confirms               Anus:  normal sphincter tone, no lesions  Chaperone, Terence Lux, CMA, was present for exam.  A:  Well Woman with normal exam PMP, no HRT H/o TAH in 2002 due to fibroids Anxiety Obesity H/o mildly elevated HbA1C  P:   Mammogram guidelines reviewed.  Has this scheduled for 2/21 Pap smear not indicated Will return for fasting lab work.  CBC, CMP, Lipids, TSH, Vit D, HbA1C Colonoscopy is UTD BMD 2018 Vaccines reviewed Return annually or prn

## 2019-05-04 NOTE — Patient Instructions (Addendum)
Replens vaginal moisturizer.  This is typically used twice weekly.

## 2019-05-08 ENCOUNTER — Other Ambulatory Visit: Payer: Self-pay

## 2019-05-19 DIAGNOSIS — M25559 Pain in unspecified hip: Secondary | ICD-10-CM | POA: Diagnosis not present

## 2019-05-19 DIAGNOSIS — M545 Low back pain: Secondary | ICD-10-CM | POA: Diagnosis not present

## 2019-05-19 DIAGNOSIS — Z6837 Body mass index (BMI) 37.0-37.9, adult: Secondary | ICD-10-CM | POA: Insufficient documentation

## 2019-06-01 ENCOUNTER — Ambulatory Visit: Payer: BC Managed Care – PPO | Attending: Internal Medicine

## 2019-06-01 DIAGNOSIS — Z20822 Contact with and (suspected) exposure to covid-19: Secondary | ICD-10-CM

## 2019-06-02 ENCOUNTER — Ambulatory Visit: Payer: BLUE CROSS/BLUE SHIELD

## 2019-06-02 LAB — NOVEL CORONAVIRUS, NAA: SARS-CoV-2, NAA: NOT DETECTED

## 2019-06-03 ENCOUNTER — Ambulatory Visit
Admission: RE | Admit: 2019-06-03 | Discharge: 2019-06-03 | Disposition: A | Discharge status: Home / Self Care | Payer: BC Managed Care – PPO | Source: Ambulatory Visit | Attending: Obstetrics & Gynecology | Admitting: Obstetrics & Gynecology

## 2019-06-03 ENCOUNTER — Other Ambulatory Visit: Payer: Self-pay

## 2019-06-03 DIAGNOSIS — Z1231 Encounter for screening mammogram for malignant neoplasm of breast: Secondary | ICD-10-CM | POA: Diagnosis not present

## 2019-06-04 DIAGNOSIS — L438 Other lichen planus: Secondary | ICD-10-CM | POA: Diagnosis not present

## 2019-06-04 DIAGNOSIS — L570 Actinic keratosis: Secondary | ICD-10-CM | POA: Diagnosis not present

## 2019-06-04 DIAGNOSIS — L738 Other specified follicular disorders: Secondary | ICD-10-CM | POA: Diagnosis not present

## 2019-06-04 DIAGNOSIS — L720 Epidermal cyst: Secondary | ICD-10-CM | POA: Diagnosis not present

## 2019-06-17 DIAGNOSIS — M48061 Spinal stenosis, lumbar region without neurogenic claudication: Secondary | ICD-10-CM | POA: Diagnosis not present

## 2019-06-17 DIAGNOSIS — M25559 Pain in unspecified hip: Secondary | ICD-10-CM | POA: Diagnosis not present

## 2019-06-17 DIAGNOSIS — M47816 Spondylosis without myelopathy or radiculopathy, lumbar region: Secondary | ICD-10-CM | POA: Diagnosis not present

## 2019-06-17 DIAGNOSIS — S76312A Strain of muscle, fascia and tendon of the posterior muscle group at thigh level, left thigh, initial encounter: Secondary | ICD-10-CM | POA: Diagnosis not present

## 2019-06-17 DIAGNOSIS — M545 Low back pain: Secondary | ICD-10-CM | POA: Diagnosis not present

## 2019-06-17 DIAGNOSIS — M5126 Other intervertebral disc displacement, lumbar region: Secondary | ICD-10-CM | POA: Diagnosis not present

## 2019-06-17 DIAGNOSIS — M160 Bilateral primary osteoarthritis of hip: Secondary | ICD-10-CM | POA: Diagnosis not present

## 2019-06-23 DIAGNOSIS — M21851 Other specified acquired deformities of right thigh: Secondary | ICD-10-CM | POA: Diagnosis not present

## 2019-06-23 DIAGNOSIS — M76892 Other specified enthesopathies of left lower limb, excluding foot: Secondary | ICD-10-CM | POA: Diagnosis not present

## 2019-06-23 DIAGNOSIS — M21852 Other specified acquired deformities of left thigh: Secondary | ICD-10-CM | POA: Diagnosis not present

## 2019-06-23 DIAGNOSIS — M25551 Pain in right hip: Secondary | ICD-10-CM | POA: Diagnosis not present

## 2019-06-23 DIAGNOSIS — M76891 Other specified enthesopathies of right lower limb, excluding foot: Secondary | ICD-10-CM | POA: Diagnosis not present

## 2019-06-24 DIAGNOSIS — M25559 Pain in unspecified hip: Secondary | ICD-10-CM | POA: Diagnosis not present

## 2019-06-24 DIAGNOSIS — Z6837 Body mass index (BMI) 37.0-37.9, adult: Secondary | ICD-10-CM | POA: Diagnosis not present

## 2019-08-03 ENCOUNTER — Telehealth: Payer: Self-pay | Admitting: Psychiatry

## 2019-08-03 ENCOUNTER — Other Ambulatory Visit: Payer: Self-pay

## 2019-08-03 MED ORDER — ALPRAZOLAM 0.5 MG PO TABS: 0.5000 mg | ORAL_TABLET | Freq: Two times a day (BID) | ORAL | 2 refills | Status: DC | PRN

## 2019-08-03 NOTE — Telephone Encounter (Signed)
Last refill 05/14/2018 #[id number], pended for Dr. Clovis Pu to submit

## 2019-08-03 NOTE — Telephone Encounter (Signed)
Bridget Joyce called to request refill of her Xanax.  Appt 10/29/19.  Send to [place].  Also she had been added to the wait list because she wants to discuss further an alternate medication to the citalopram.  Lexapro was suggested buts wants to discuss it first.

## 2019-08-04 NOTE — Telephone Encounter (Signed)
Bridget Joyce called back about wanting to discuss the medication options.  She is trying to get an appt. To come in but nothing is available so would like for Dr. Clovis Pu to call her.  Her next scheduled appt is 10/29/19.

## 2019-08-05 NOTE — Telephone Encounter (Signed)
Bridget Joyce called again.  She really needs to discuss the lexapro or some option.  She needs something since stopping the citalopram.  Please call so something can be resolved.  Still she wants Dr. Clovis Pu to call her.  She will not be available between 3-6

## 2019-08-06 ENCOUNTER — Telehealth: Payer: Self-pay | Admitting: Psychiatry

## 2019-08-06 MED ORDER — ESCITALOPRAM OXALATE 10 MG PO TABS: 10.0000 mg | ORAL_TABLET | Freq: Every day | ORAL | 0 refills | Status: DC

## 2019-08-06 NOTE — Telephone Encounter (Signed)
Pt called and agreed to start Lexapro 10.

## 2019-08-06 NOTE — Telephone Encounter (Signed)
Bridget Joyce called again.  She doesn't understand why she doesn't get called back.  It is important to her.  Still upset over loss of cat.  Really needs to go ahead and discuss medications.  Please call her.

## 2019-08-06 NOTE — Telephone Encounter (Signed)
RTC  Weaned off citalopram to 1/4 tablet for a couple of months.  2 weeks ago cat died and felt worse than in her life. Couldn't sleep.  Upset over it.  A lot of anxity and somatic sx and guilty.  Wonders about Lexapro.   Disc pros/cons  Lexapro is a good idea bc similarity to citalopram and her history of med sensitivity.    Start Lexparo 10 mg in evening.  She agrees.  Lynder Parents, MD, DFAPA

## 2019-08-11 ENCOUNTER — Other Ambulatory Visit: Payer: Self-pay

## 2019-08-11 ENCOUNTER — Ambulatory Visit (INDEPENDENT_AMBULATORY_CARE_PROVIDER_SITE_OTHER): Payer: BC Managed Care – PPO | Admitting: Psychiatry

## 2019-08-11 ENCOUNTER — Encounter: Payer: Self-pay | Admitting: Psychiatry

## 2019-08-11 DIAGNOSIS — F331 Major depressive disorder, recurrent, moderate: Secondary | ICD-10-CM | POA: Diagnosis not present

## 2019-08-11 DIAGNOSIS — F411 Generalized anxiety disorder: Secondary | ICD-10-CM | POA: Diagnosis not present

## 2019-08-11 DIAGNOSIS — F4321 Adjustment disorder with depressed mood: Secondary | ICD-10-CM

## 2019-08-11 DIAGNOSIS — F341 Dysthymic disorder: Secondary | ICD-10-CM

## 2019-08-11 NOTE — Progress Notes (Signed)
Bridget Joyce:[id number] Jul 14, 1955 64 y.o.     Subjective:   Patient ID:  Bridget Joyce is a 64 y.o. (DOB [birth year]) female.  Chief Complaint:  Chief Complaint  Patient presents with  . Depression  . Anxiety  . Grief  . Follow-up    Addition of Lexapro    HPI Bridget Joyce presents  today for follow-up of anxiety and mood.  Last visit December 2020.  She wanted to wean off the citalopram to see if she can lose weight.  She called August 03, 2019 wanting to restart an antidepressant.  Her favorite cat had died.  She stated she felt the worst she ever had in her life.  She was having crying spells, depression, and insomnia along with a lot of anxiety with somatic symptoms and prominent guilt feelings.  As we had discussed before an alternative to citalopram was Lexapro she wanted to start the medication. Prescription sent for Lexapro 10 mg every evening.  She started Lexapro at 5 mg daily.  Tolerated.  No effect so far.   A lot of guilt feelings over the loss of the cat. Functioning OK at work.  Prefers not to be alone initially but better some. Has supportive friends and sister.  Has been well since June 2019 when had pneumonia probably.    Pretty stressful with coworker who is not working like they should.  Working from home and busy.  Mood fairly well.  Patient reports stable mood and denies depressed or irritable moods.  Patient denies any recent difficulty with more than mild anxiety.  Patient denies difficulty with sleep initiation or maintenance. Denies appetite disturbance.  Patient reports that energy and motivation have been good.  Patient denies any difficulty with concentration.  Patient denies any suicidal ideation.  Work pretty good.   Past Psychiatric Medication Trials:  Wellbutrin anxiety, sertraline tremor, Paxil sweats, citalopram,  buspirone, Xanax, propranolol, Lyrica, Trintellix, gabapentin, fluoxetine, Effexor 150, mirtazapine, Adderall,  duloxetine  Review of Systems:  Review of Systems  Respiratory: Positive for shortness of breath.   Cardiovascular: Negative for chest pain.  Musculoskeletal: Positive for arthralgias.  Neurological: Positive for headaches. Negative for dizziness, tremors and weakness.  Psychiatric/Behavioral: Negative for agitation, behavioral problems, confusion, decreased concentration, dysphoric mood, hallucinations, self-injury, sleep disturbance and suicidal ideas. The patient is not nervous/anxious and is not hyperactive.     Medications: I have reviewed the patient's current medications.  Current Outpatient Medications  Medication Sig Dispense Refill  . ALPRAZolam (XANAX) 0.5 MG tablet Take 1 tablet (0.5 mg total) by mouth 2 (two) times daily as needed for anxiety. 60 tablet 2  . Ascorbic Acid (VITAMIN C PO) Take by mouth daily.    . beclomethasone (QVAR REDIHALER) 40 MCG/ACT inhaler Take 1 puff by mouth daily.    . bumetanide (BUMEX) 1 MG tablet Take 1 tablet (1 mg total) by mouth daily as needed. For fluid. 30 tablet 2  . busPIRone (BUSPAR) 15 MG tablet Take 1 tablet (15 mg total) by mouth 2 (two) times daily. (Patient taking differently: Take 15 mg by mouth at bedtime. ) 180 tablet 1  . CALCIUM PO Take by mouth.    . escitalopram (LEXAPRO) 10 MG tablet Take 1 tablet (10 mg total) by mouth daily. (Patient taking differently: Take 5 mg by mouth daily. ) 90 tablet 0  . magnesium 30 MG tablet Take 30 mg by mouth 2 (two) times daily.    . Multiple Vitamin (MULTIVITAMIN) tablet Take  1 tablet by mouth daily.    . Omega-3 Fatty Acids (FISH OIL) 1000 MG CAPS Take by mouth.    Marland Kitchen PROAIR HFA 108 (90 Base) MCG/ACT inhaler     . citalopram (CELEXA) 20 MG tablet TAKE 1 TABLET BY MOUTH EVERY DAY (Patient not taking: Been doing for 2 weeks) 90 tablet 1   No current facility-administered medications for this visit.    Medication Side Effects: None  Allergies:  Allergies  Allergen Reactions  . [place]  Grass Extract Itching  . Grass Pollen(K-O-R-T-Swt Vern)   . Mold Extract  [Trichophyton] Itching  . Molds & Smuts   . Other   . Short Ragweed Pollen Ext     Past Medical History:  Diagnosis Date  . Allergy    rag weed, pollen,dust  . Ankle fracture 12/2017   Left   . Anxiety    mild; infrequent per patient  . Asthma   . Bursitis of hip   . Diverticulosis   . External hemorrhoids   . Fibroid   . Fibromyalgia   . GERD (gastroesophageal reflux disease)   . Obesity   . Peripheral edema    infrequent per patient  . Prediabetes   . Vitamin D deficiency     Family History  Problem Relation Age of Onset  . Hypertension Father   . Heart disease Father   . Diabetes Father   . Heart failure Father   . Uterine cancer Sister   . Diabetes Sister   . Stroke Mother   . Colon cancer Neg Hx   . Esophageal cancer Neg Hx   . Stomach cancer Neg Hx   . Rectal cancer Neg Hx     Social History   Socioeconomic History  . Marital status: Single    Spouse name: Not on file  . Number of children: Not on file  . Years of education: Not on file  . Highest education level: Not on file  Occupational History  . Occupation: Event organiser: [employer]  Tobacco Use  . Smoking status: Never Smoker  . Smokeless tobacco: Never Used  Substance and Sexual Activity  . Alcohol use: No  . Drug use: No  . Sexual activity: Not Currently    Birth control/protection: Abstinence  Other Topics Concern  . Not on file  Social History Narrative  . Not on file   Social Determinants of Health   Financial Resource Strain:   . Difficulty of Paying Living Expenses:   Food Insecurity:   . Worried About Charity fundraiser in the Last Year:   . Arboriculturist in the Last Year:   Transportation Needs:   . Film/video editor (Medical):   Marland Kitchen Lack of Transportation (Non-Medical):   Physical Activity:   . Days of Exercise per Week:   . Minutes of Exercise per Session:   Stress:    . Feeling of Stress :   Social Connections:   . Frequency of Communication with Friends and Family:   . Frequency of Social Gatherings with Friends and Family:   . Attends Religious Services:   . Active Member of Clubs or Organizations:   . Attends Archivist Meetings:   Marland Kitchen Marital Status:   Intimate Partner Violence:   . Fear of Current or Ex-Partner:   . Emotionally Abused:   Marland Kitchen Physically Abused:   . Sexually Abused:     Past Medical History, Surgical history, Social history, and Family  history were reviewed and updated as appropriate.   Please see review of systems for further details on the patient's review from today.   Objective:   Physical Exam:  LMP 10/14/2000   Physical Exam Constitutional:      General: She is not in acute distress.    Appearance: She is well-developed. She is obese.  Musculoskeletal:        General: No deformity.  Neurological:     Mental Status: She is alert and oriented to person, place, and time.     Motor: No tremor.     Coordination: Coordination normal.     Gait: Gait normal.  Psychiatric:        Attention and Perception: Attention and perception normal.        Mood and Affect: Mood is anxious and depressed. Affect is not labile, blunt, angry or inappropriate.        Speech: Speech normal.        Behavior: Behavior normal.        Thought Content: Thought content normal. Thought content does not include homicidal or suicidal ideation. Thought content does not include homicidal or suicidal plan.        Cognition and Memory: Cognition normal.        Judgment: Judgment normal.     Comments: Insight intact. No auditory or visual hallucinations. No delusions.  Guilt     Lab Review:     Component Value Date/Time   NA 141 03/07/2016 1651   K 3.9 03/07/2016 1651   CL 101 03/07/2016 1651   CO2 29 03/07/2016 1651   GLUCOSE 80 03/07/2016 1651   BUN 11 03/07/2016 1651   CREATININE 0.97 03/07/2016 1651   CALCIUM 9.5 03/07/2016  1651   PROT 7.2 03/07/2016 1651   ALBUMIN 4.6 03/07/2016 1651   AST 24 03/07/2016 1651   ALT 26 03/07/2016 1651   ALKPHOS 97 03/07/2016 1651   BILITOT 0.5 03/07/2016 1651   GFRNONAA 64 03/07/2016 1651   GFRAA 74 03/07/2016 1651       Component Value Date/Time   WBC 7.4 03/07/2016 1651   RBC 4.32 03/07/2016 1651   HGB 13.5 03/07/2016 1651   HCT 40.1 03/07/2016 1651   PLT 351 03/07/2016 1651   MCV 92.8 03/07/2016 1651   MCH 31.3 03/07/2016 1651   MCHC 33.7 03/07/2016 1651   RDW 13.5 03/07/2016 1651   LYMPHSABS 2,146 03/07/2016 1651   MONOABS 518 03/07/2016 1651   EOSABS 148 03/07/2016 1651   BASOSABS 0 03/07/2016 1651    No results found for: POCLITH, LITHIUM   No results found for: PHENYTOIN, PHENOBARB, VALPROATE, CBMZ   .res Assessment: Plan:    Major depressive disorder, recurrent episode, moderate (HCC)  Generalized anxiety disorder  Dysthymia  Grief    Greater than 50% of 30 min face to face time with patient was spent on counseling and coordination of care and grief counseling. Today urgent appt because after tapering down to citalopram 5 mg daily her most favorite cat died.  This is triggered tremendous anxiety and depressive symptoms and guilt feelings.  She called wanting to switch to Lexapro as we had discussed in the past.  She is only been on Lexapro 5 mg for about 5 days.  She is tolerated it well.  We discussed Option as discussed to increase the Buspar for anxiety if needed.  She is taking a very low dosage.  Option increase if needed.  Grief work done.  Rec therapeutic  writing and journaling.  Disc dealing with the guilt.   Disc mechanism of action of SSRI vs buspirone.   Start Lexapro 10 mg daily.  Disc diet and avoiding sugar.  Answered questions about diet pills at length including phentermine and Saxenda.  Consider naltrexone off label.  None Rx today  As can been seen before above she has tried multiple other psych meds.  Disc SE.    FU 2  mos  Lynder Parents, MD, DFAPA   Please see After Visit Summary for patient specific instructions.  Future Appointments  Date Time Provider [place]  08/19/2019 11:45 AM [place] [facility] [place] None  10/29/2019  1:00 PM Cottle, Billey Co., MD [facility] None  07/15/2020  9:30 AM Megan Salon, MD [place] None    No orders of the defined types were placed in this encounter.     -------------------------------

## 2019-08-19 ENCOUNTER — Other Ambulatory Visit: Payer: Self-pay | Admitting: Family Medicine

## 2019-08-19 ENCOUNTER — Other Ambulatory Visit: Payer: Self-pay

## 2019-08-19 ENCOUNTER — Other Ambulatory Visit: Payer: Self-pay | Admitting: Obstetrics & Gynecology

## 2019-08-19 DIAGNOSIS — I739 Peripheral vascular disease, unspecified: Secondary | ICD-10-CM

## 2019-08-19 DIAGNOSIS — M2550 Pain in unspecified joint: Secondary | ICD-10-CM

## 2019-08-19 NOTE — Progress Notes (Signed)
Worsening bilat claudication. 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   Linna Darner, MD Family Medicine 08/19/2019, 1:20 PM

## 2019-08-25 ENCOUNTER — Ambulatory Visit
Admission: RE | Admit: 2019-08-25 | Discharge: 2019-08-25 | Disposition: A | Discharge status: Home / Self Care | Payer: BC Managed Care – PPO | Source: Ambulatory Visit | Attending: Family Medicine | Admitting: Family Medicine

## 2019-08-25 DIAGNOSIS — I739 Peripheral vascular disease, unspecified: Secondary | ICD-10-CM

## 2019-08-25 DIAGNOSIS — R531 Weakness: Secondary | ICD-10-CM | POA: Diagnosis not present

## 2019-08-27 ENCOUNTER — Ambulatory Visit: Payer: Self-pay | Admitting: Obstetrics & Gynecology

## 2019-08-27 DIAGNOSIS — R21 Rash and other nonspecific skin eruption: Secondary | ICD-10-CM | POA: Diagnosis not present

## 2019-08-28 DIAGNOSIS — R21 Rash and other nonspecific skin eruption: Secondary | ICD-10-CM | POA: Diagnosis not present

## 2019-08-28 DIAGNOSIS — M329 Systemic lupus erythematosus, unspecified: Secondary | ICD-10-CM | POA: Diagnosis not present

## 2019-09-16 DIAGNOSIS — M79644 Pain in right finger(s): Secondary | ICD-10-CM | POA: Diagnosis not present

## 2019-10-29 ENCOUNTER — Ambulatory Visit: Payer: BC Managed Care – PPO | Admitting: Psychiatry

## 2019-10-29 ENCOUNTER — Other Ambulatory Visit: Payer: Self-pay | Admitting: Psychiatry

## 2019-10-29 DIAGNOSIS — M797 Fibromyalgia: Secondary | ICD-10-CM | POA: Insufficient documentation

## 2019-10-29 DIAGNOSIS — F419 Anxiety disorder, unspecified: Secondary | ICD-10-CM | POA: Insufficient documentation

## 2019-11-03 DIAGNOSIS — K219 Gastro-esophageal reflux disease without esophagitis: Secondary | ICD-10-CM | POA: Diagnosis not present

## 2019-11-03 DIAGNOSIS — M1711 Unilateral primary osteoarthritis, right knee: Secondary | ICD-10-CM | POA: Diagnosis not present

## 2019-11-03 DIAGNOSIS — J309 Allergic rhinitis, unspecified: Secondary | ICD-10-CM | POA: Diagnosis not present

## 2019-11-03 DIAGNOSIS — M1712 Unilateral primary osteoarthritis, left knee: Secondary | ICD-10-CM | POA: Diagnosis not present

## 2019-11-26 DIAGNOSIS — L738 Other specified follicular disorders: Secondary | ICD-10-CM | POA: Diagnosis not present

## 2019-11-26 DIAGNOSIS — D692 Other nonthrombocytopenic purpura: Secondary | ICD-10-CM | POA: Diagnosis not present

## 2019-11-26 DIAGNOSIS — L578 Other skin changes due to chronic exposure to nonionizing radiation: Secondary | ICD-10-CM | POA: Diagnosis not present

## 2019-11-26 DIAGNOSIS — L438 Other lichen planus: Secondary | ICD-10-CM | POA: Diagnosis not present

## 2019-12-23 DIAGNOSIS — M25551 Pain in right hip: Secondary | ICD-10-CM | POA: Diagnosis not present

## 2019-12-23 DIAGNOSIS — M162 Bilateral osteoarthritis resulting from hip dysplasia: Secondary | ICD-10-CM | POA: Diagnosis not present

## 2019-12-23 DIAGNOSIS — F341 Dysthymic disorder: Secondary | ICD-10-CM | POA: Diagnosis not present

## 2019-12-23 DIAGNOSIS — M7061 Trochanteric bursitis, right hip: Secondary | ICD-10-CM | POA: Diagnosis not present

## 2019-12-29 ENCOUNTER — Other Ambulatory Visit: Payer: Self-pay

## 2019-12-29 ENCOUNTER — Ambulatory Visit (INDEPENDENT_AMBULATORY_CARE_PROVIDER_SITE_OTHER): Payer: BC Managed Care – PPO | Admitting: Psychiatry

## 2019-12-29 ENCOUNTER — Encounter: Payer: Self-pay | Admitting: Psychiatry

## 2019-12-29 DIAGNOSIS — F341 Dysthymic disorder: Secondary | ICD-10-CM | POA: Diagnosis not present

## 2019-12-29 DIAGNOSIS — F4321 Adjustment disorder with depressed mood: Secondary | ICD-10-CM

## 2019-12-29 DIAGNOSIS — F411 Generalized anxiety disorder: Secondary | ICD-10-CM | POA: Diagnosis not present

## 2019-12-29 DIAGNOSIS — F331 Major depressive disorder, recurrent, moderate: Secondary | ICD-10-CM | POA: Diagnosis not present

## 2019-12-29 NOTE — Progress Notes (Signed)
Bridget Joyce [id number] 1956-03-27 64 y.o.     Subjective:   Patient ID:  Bridget Joyce is a 64 y.o. (DOB 1955-11-02) female.  Chief Complaint:  Chief Complaint  Patient presents with  . Follow-up  . Anxiety    HPI [name] presents  today for follow-up of anxiety and mood.  Last visit December 2020.  She wanted to wean off the citalopram to see if she can lose weight.  08/11/19 appt with the following noted: She called August 03, 2019 wanting to restart an antidepressant.  Her favorite cat had died.  She stated she felt the worst she ever had in her life.  She was having crying spells, depression, and insomnia along with a lot of anxiety with somatic symptoms and prominent guilt feelings.  As we had discussed before an alternative to citalopram was Lexapro she wanted to start the medication. Prescription sent for Lexapro 10 mg every evening. She started Lexapro at 5 mg daily.  Tolerated.  No effect so far.  A lot of guilt feelings over the loss of the cat. Functioning OK at work.  Prefers not to be alone initially but better some. Has supportive friends and sister. Plan: Start Lexapro 10 mg daily.  12/29/19 appt with the following noted: Increased to 10 mg Lexapro and felt better.  Trying to stop it now.  I felt better now re: cat.  Afraid that it can increase her appetite and wants to wean. Back down to 5 mg Lexapro for 2-3 weeks or so and now down to 1/4 tablet for a month..   Patient reports stable mood and denies depressed or irritable moods.  Patient denies any recent difficulty with more than mild anxiety.  Patient denies difficulty with sleep initiation or maintenance. Denies appetite disturbance.  Patient reports that energy and motivation have been good.  Patient denies any difficulty with concentration.  Patient denies any suicidal ideation.  Work pretty good.   Past Psychiatric Medication Trials:  Wellbutrin anxiety, sertraline tremor, Paxil sweats, citalopram,   buspirone, Xanax, propranolol, Lyrica, Trintellix, gabapentin, fluoxetine, Effexor 150, mirtazapine, Adderall, duloxetine  Review of Systems:  Review of Systems  Respiratory: Positive for shortness of breath.   Cardiovascular: Negative for chest pain and palpitations.  Musculoskeletal: Positive for arthralgias.  Neurological: Positive for headaches. Negative for dizziness, tremors and weakness.  Psychiatric/Behavioral: Negative for agitation, behavioral problems, confusion, decreased concentration, dysphoric mood, hallucinations, self-injury, sleep disturbance and suicidal ideas. The patient is not nervous/anxious and is not hyperactive.     Medications: I have reviewed the patient's current medications.  Current Outpatient Medications  Medication Sig Dispense Refill  . ALPRAZolam (XANAX) 0.5 MG tablet Take 1 tablet (0.5 mg total) by mouth 2 (two) times daily as needed for anxiety. 60 tablet 2  . Ascorbic Acid (VITAMIN C PO) Take by mouth daily.    . beclomethasone (QVAR REDIHALER) 40 MCG/ACT inhaler Take 1 puff by mouth daily.    . bumetanide (BUMEX) 1 MG tablet Take 1 tablet (1 mg total) by mouth daily as needed. For fluid. 30 tablet 2  . CALCIUM PO Take by mouth.    . escitalopram (LEXAPRO) 10 MG tablet TAKE 1 TABLET BY MOUTH EVERY DAY (Patient taking differently: Take 2.5 mg by mouth daily. ) 90 tablet 0  . magnesium 30 MG tablet Take 30 mg by mouth 2 (two) times daily.    . Multiple Vitamin (MULTIVITAMIN) tablet Take 1 tablet by mouth daily.    Marland Kitchen  Omega-3 Fatty Acids (FISH OIL) 1000 MG CAPS Take by mouth.    Marland Kitchen PROAIR HFA 108 (90 Base) MCG/ACT inhaler     . busPIRone (BUSPAR) 15 MG tablet Take 1 tablet (15 mg total) by mouth 2 (two) times daily. (Patient taking differently: Take 15 mg by mouth at bedtime. ) 180 tablet 1   No current facility-administered medications for this visit.    Medication Side Effects: None  Allergies:  Allergies  Allergen Reactions  . [place] Grass  Extract Itching  . Grass Pollen(K-O-R-T-Swt Vern)   . Mold Extract  [Trichophyton] Itching  . Molds & Smuts   . Other   . Short Ragweed Pollen Ext     Past Medical History:  Diagnosis Date  . Allergy    rag weed, pollen,dust  . Ankle fracture 12/2017   Left   . Anxiety    mild; infrequent per patient  . Asthma   . Bursitis of hip   . Diverticulosis   . External hemorrhoids   . Fibroid   . Fibromyalgia   . GERD (gastroesophageal reflux disease)   . Obesity   . Peripheral edema    infrequent per patient  . Prediabetes   . Vitamin D deficiency     Family History  Problem Relation Age of Onset  . Hypertension Father   . Heart disease Father   . Diabetes Father   . Heart failure Father   . Uterine cancer Sister   . Diabetes Sister   . Stroke Mother   . Colon cancer Neg Hx   . Esophageal cancer Neg Hx   . Stomach cancer Neg Hx   . Rectal cancer Neg Hx     Social History   Socioeconomic History  . Marital status: Single    Spouse name: Not on file  . Number of children: Not on file  . Years of education: Not on file  . Highest education level: Not on file  Occupational History  . Occupation: Event organiser: [employer]  Tobacco Use  . Smoking status: Never Smoker  . Smokeless tobacco: Never Used  Vaping Use  . Vaping Use: Never used  Substance and Sexual Activity  . Alcohol use: No  . Drug use: No  . Sexual activity: Not Currently    Birth control/protection: Abstinence  Other Topics Concern  . Not on file  Social History Narrative  . Not on file   Social Determinants of Health   Financial Resource Strain:   . Difficulty of Paying Living Expenses: Not on file  Food Insecurity:   . Worried About Charity fundraiser in the Last Year: Not on file  . Ran Out of Food in the Last Year: Not on file  Transportation Needs:   . Lack of Transportation (Medical): Not on file  . Lack of Transportation (Non-Medical): Not on file  Physical  Activity:   . Days of Exercise per Week: Not on file  . Minutes of Exercise per Session: Not on file  Stress:   . Feeling of Stress : Not on file  Social Connections:   . Frequency of Communication with Friends and Family: Not on file  . Frequency of Social Gatherings with Friends and Family: Not on file  . Attends Religious Services: Not on file  . Active Member of Clubs or Organizations: Not on file  . Attends Archivist Meetings: Not on file  . Marital Status: Not on file  Intimate Partner Violence:   .  Fear of Current or Ex-Partner: Not on file  . Emotionally Abused: Not on file  . Physically Abused: Not on file  . Sexually Abused: Not on file    Past Medical History, Surgical history, Social history, and Family history were reviewed and updated as appropriate.   Please see review of systems for further details on the patient's review from today.   Objective:   Physical Exam:  LMP 10/14/2000   Physical Exam Constitutional:      General: She is not in acute distress.    Appearance: She is well-developed. She is obese.  Musculoskeletal:        General: No deformity.  Neurological:     Mental Status: She is alert and oriented to person, place, and time.     Motor: No tremor.     Coordination: Coordination normal.     Gait: Gait normal.  Psychiatric:        Attention and Perception: Attention and perception normal.        Mood and Affect: Mood is anxious and depressed. Affect is not labile, blunt, angry or inappropriate.        Speech: Speech normal.        Behavior: Behavior normal.        Thought Content: Thought content normal. Thought content does not include homicidal or suicidal ideation. Thought content does not include homicidal or suicidal plan.        Cognition and Memory: Cognition normal.        Judgment: Judgment normal.     Comments: Insight intact. No auditory or visual hallucinations. No delusions.  Less Guilt     Lab Review:      Component Value Date/Time   NA 141 03/07/2016 1651   K 3.9 03/07/2016 1651   CL 101 03/07/2016 1651   CO2 29 03/07/2016 1651   GLUCOSE 80 03/07/2016 1651   BUN 11 03/07/2016 1651   CREATININE 0.97 03/07/2016 1651   CALCIUM 9.5 03/07/2016 1651   PROT 7.2 03/07/2016 1651   ALBUMIN 4.6 03/07/2016 1651   AST 24 03/07/2016 1651   ALT 26 03/07/2016 1651   ALKPHOS 97 03/07/2016 1651   BILITOT 0.5 03/07/2016 1651   GFRNONAA 64 03/07/2016 1651   GFRAA 74 03/07/2016 1651       Component Value Date/Time   WBC 7.4 03/07/2016 1651   RBC 4.32 03/07/2016 1651   HGB 13.5 03/07/2016 1651   HCT 40.1 03/07/2016 1651   PLT 351 03/07/2016 1651   MCV 92.8 03/07/2016 1651   MCH 31.3 03/07/2016 1651   MCHC 33.7 03/07/2016 1651   RDW 13.5 03/07/2016 1651   LYMPHSABS 2,146 03/07/2016 1651   MONOABS 518 03/07/2016 1651   EOSABS 148 03/07/2016 1651   BASOSABS 0 03/07/2016 1651    No results found for: POCLITH, LITHIUM   No results found for: PHENYTOIN, PHENOBARB, VALPROATE, CBMZ   .res Assessment: Plan:    Major depressive disorder, recurrent episode, moderate (HCC)  Generalized anxiety disorder  Dysthymia  Grief    Greater than 50% of 30 min face to face time with patient was spent on counseling and coordination of care and grief counseling.  Better than last time.  Grief work done.  Rec therapeutic writing and journaling.  Disc dealing with the guilt.   Disc mechanism of action of SSRI vs buspirone.   She wants to wean Lexapro in hopes of losing weight.  Disc risk of relapse. Disc dosing and effect.  She will  stop it.  Disc diet and avoiding sugar.  Answered questions about diet pills at length including phentermine and Saxenda.  Consider naltrexone off label.  None Rx today  She asks about it again.  Pt has talked to PCP about it. Disc Ozempic for weight loss.  Gave copy of JAMA article.  As can been seen before above she has tried multiple other psych meds.  Disc SE.    FU  6 mos  Lynder Parents, MD, DFAPA   Please see After Visit Summary for patient specific instructions.  Future Appointments  Date Time Provider [place]  06/17/2020  3:00 PM Marin Olp, MD [facility]  07/15/2020  9:30 AM Megan Salon, MD [place] None    No orders of the defined types were placed in this encounter.     -------------------------------

## 2020-01-06 DIAGNOSIS — M25552 Pain in left hip: Secondary | ICD-10-CM | POA: Diagnosis not present

## 2020-01-07 ENCOUNTER — Other Ambulatory Visit: Payer: Self-pay | Admitting: Physician Assistant

## 2020-01-07 DIAGNOSIS — M25552 Pain in left hip: Secondary | ICD-10-CM

## 2020-01-11 DIAGNOSIS — M25552 Pain in left hip: Secondary | ICD-10-CM | POA: Diagnosis not present

## 2020-01-13 DIAGNOSIS — M25552 Pain in left hip: Secondary | ICD-10-CM | POA: Diagnosis not present

## 2020-01-18 DIAGNOSIS — M25552 Pain in left hip: Secondary | ICD-10-CM | POA: Diagnosis not present

## 2020-01-19 ENCOUNTER — Ambulatory Visit
Admission: RE | Admit: 2020-01-19 | Discharge: 2020-01-19 | Disposition: A | Discharge status: Home / Self Care | Payer: BC Managed Care – PPO | Source: Ambulatory Visit | Attending: Physician Assistant | Admitting: Physician Assistant

## 2020-01-19 DIAGNOSIS — M7062 Trochanteric bursitis, left hip: Secondary | ICD-10-CM | POA: Diagnosis not present

## 2020-01-19 DIAGNOSIS — M25552 Pain in left hip: Secondary | ICD-10-CM

## 2020-01-20 DIAGNOSIS — M25552 Pain in left hip: Secondary | ICD-10-CM | POA: Diagnosis not present

## 2020-01-26 DIAGNOSIS — M25552 Pain in left hip: Secondary | ICD-10-CM | POA: Diagnosis not present

## 2020-01-28 DIAGNOSIS — M1612 Unilateral primary osteoarthritis, left hip: Secondary | ICD-10-CM | POA: Diagnosis not present

## 2020-01-29 ENCOUNTER — Other Ambulatory Visit: Payer: BC Managed Care – PPO

## 2020-02-02 DIAGNOSIS — M25552 Pain in left hip: Secondary | ICD-10-CM | POA: Diagnosis not present

## 2020-02-02 DIAGNOSIS — M9904 Segmental and somatic dysfunction of sacral region: Secondary | ICD-10-CM | POA: Diagnosis not present

## 2020-02-02 DIAGNOSIS — M9903 Segmental and somatic dysfunction of lumbar region: Secondary | ICD-10-CM | POA: Diagnosis not present

## 2020-02-02 DIAGNOSIS — M545 Low back pain, unspecified: Secondary | ICD-10-CM | POA: Diagnosis not present

## 2020-02-03 DIAGNOSIS — H10412 Chronic giant papillary conjunctivitis, left eye: Secondary | ICD-10-CM | POA: Diagnosis not present

## 2020-02-03 DIAGNOSIS — H5010 Unspecified exotropia: Secondary | ICD-10-CM | POA: Diagnosis not present

## 2020-02-03 DIAGNOSIS — H5213 Myopia, bilateral: Secondary | ICD-10-CM | POA: Diagnosis not present

## 2020-02-04 ENCOUNTER — Encounter: Payer: Self-pay | Admitting: Family Medicine

## 2020-02-04 ENCOUNTER — Ambulatory Visit: Payer: BC Managed Care – PPO | Admitting: Family Medicine

## 2020-02-04 ENCOUNTER — Other Ambulatory Visit: Payer: Self-pay

## 2020-02-04 DIAGNOSIS — M1612 Unilateral primary osteoarthritis, left hip: Secondary | ICD-10-CM | POA: Insufficient documentation

## 2020-02-04 MED ORDER — VITAMIN D (ERGOCALCIFEROL) 1.25 MG (50000 UNIT) PO CAPS: 50000.0000 [IU] | ORAL_CAPSULE | ORAL | 0 refills | Status: DC

## 2020-02-04 NOTE — Assessment & Plan Note (Addendum)
Left hip arthritis.  Patient does also have a stress fracture noted on the MRI.  Discussed with patient in great length.  I do believe with patient is decreased range of motion significant pain and the amount that this is affecting even activities of daily living that I would suggest that patient do truly consider the replacement.  At the moment we did discuss once weekly vitamin D.  Told her to stop other vitamin supplementations.  This will be short-term for the next 1 to 2 months.  We discussed potential compression.  Low impact exercises only.  Patient did bring up the idea of osteopathic manipulation which I do not feel patient is a candidate for at this moment we can discuss after patient has potential hip replacement.  Patient understands.  Total time with patient today with talking as well as reviewing patient's outside records on the date of service 45 minutes

## 2020-02-04 NOTE — Progress Notes (Signed)
[facility] [place] Phone: [phone] Subjective:   Fontaine No, am serving as a scribe for Dr. Hulan Saas. This visit occurred during the SARS-CoV-2 public health emergency.  Safety protocols were in place, including screening questions prior to the visit, additional usage of staff PPE, and extensive cleaning of exam room while observing appropriate contact time as indicated for disinfecting solutions.   I'm seeing this patient by the request  of:  Patient, No Pcp Per  CC: Left hip pain  [id number]  MANHA AMATO is a 64 y.o. female coming in with complaint of back and hip pain for past 3 weeks. Patient states that she was lying on edge of bed and was performing a stretch with left leg across right, allowing it to drop below the right hip. States that she had a hard time walking due to pain. Pain in left glute that is sharp. Also has groin pain. Patient noticed that her left leg was longer than the right leg since Sunday. Has been doing physical therapy with Barbaraann Barthel. Patient did go to chiropractor earlier this week. States that the adjustment helped the leg length issue. Has also tried dry needling in left glute when she has muscle spasms. Patient was seen by provider at [facility] for consultation regarding hip replacement.      Patient did have MRI of the hip done on January 19, 2020.  MRI is consistent with bilateral hip arthritic changes left greater than right and also has what appears to be a left femoral head stress fracture.  Joint effusion noted  Past Medical History:  Diagnosis Date  . Allergy    rag weed, pollen,dust  . Ankle fracture 12/2017   Left   . Anxiety    mild; infrequent per patient  . Asthma   . Bursitis of hip   . Diverticulosis   . External hemorrhoids   . Fibroid   . Fibromyalgia   . GERD (gastroesophageal reflux disease)   . Obesity   . Peripheral edema    infrequent per  patient  . Prediabetes   . Vitamin D deficiency    Past Surgical History:  Procedure Laterality Date  . ABDOMINAL HYSTERECTOMY  2002   ovaries remain  . COLONOSCOPY  09/02/2006   external hemorrhoids,diverticulosis  . KNEE SURGERY     Left  . LIPOMA EXCISION     Labial  . MANDIBLE SURGERY    . MOUTH SURGERY    . REPAIR ANKLE LIGAMENT  04-05-2014   x2  . TONSILLECTOMY     Social History   Socioeconomic History  . Marital status: Single    Spouse name: Not on file  . Number of children: Not on file  . Years of education: Not on file  . Highest education level: Not on file  Occupational History  . Occupation: Event organiser: [employer]  Tobacco Use  . Smoking status: Never Smoker  . Smokeless tobacco: Never Used  Vaping Use  . Vaping Use: Never used  Substance and Sexual Activity  . Alcohol use: No  . Drug use: No  . Sexual activity: Not Currently    Birth control/protection: Abstinence  Other Topics Concern  . Not on file  Social History Narrative  . Not on file   Social Determinants of Health   Financial Resource Strain:   . Difficulty of Paying Living Expenses: Not on file  Food Insecurity:   .  Worried About Charity fundraiser in the Last Year: Not on file  . Ran Out of Food in the Last Year: Not on file  Transportation Needs:   . Lack of Transportation (Medical): Not on file  . Lack of Transportation (Non-Medical): Not on file  Physical Activity:   . Days of Exercise per Week: Not on file  . Minutes of Exercise per Session: Not on file  Stress:   . Feeling of Stress : Not on file  Social Connections:   . Frequency of Communication with Friends and Family: Not on file  . Frequency of Social Gatherings with Friends and Family: Not on file  . Attends Religious Services: Not on file  . Active Member of Clubs or Organizations: Not on file  . Attends Archivist Meetings: Not on file  . Marital Status: Not on file   Allergies    Allergen Reactions  . [place] Grass Extract Itching  . Grass Pollen(K-O-R-T-Swt Vern)   . Mold Extract  [Trichophyton] Itching  . Molds & Smuts   . Other   . Short Ragweed Pollen Ext    Family History  Problem Relation Age of Onset  . Hypertension Father   . Heart disease Father   . Diabetes Father   . Heart failure Father   . Uterine cancer Sister   . Diabetes Sister   . Stroke Mother   . Colon cancer Neg Hx   . Esophageal cancer Neg Hx   . Stomach cancer Neg Hx   . Rectal cancer Neg Hx      Current Outpatient Medications (Cardiovascular):  .  bumetanide (BUMEX) 1 MG tablet, Take 1 tablet (1 mg total) by mouth daily as needed. For fluid.  Current Outpatient Medications (Respiratory):  .  beclomethasone (QVAR REDIHALER) 40 MCG/ACT inhaler, Take 1 puff by mouth daily. Marland Kitchen  PROAIR HFA 108 (90 Base) MCG/ACT inhaler,     Current Outpatient Medications (Other):  Marland Kitchen  ALPRAZolam (XANAX) 0.5 MG tablet, Take 1 tablet (0.5 mg total) by mouth 2 (two) times daily as needed for anxiety. .  Ascorbic Acid (VITAMIN C PO), Take by mouth daily. .  busPIRone (BUSPAR) 15 MG tablet, Take 1 tablet (15 mg total) by mouth 2 (two) times daily. (Patient taking differently: Take 15 mg by mouth at bedtime. ) .  CALCIUM PO, Take by mouth. .  escitalopram (LEXAPRO) 10 MG tablet, TAKE 1 TABLET BY MOUTH EVERY DAY (Patient taking differently: Take 2.5 mg by mouth daily. ) .  magnesium 30 MG tablet, Take 30 mg by mouth 2 (two) times daily. .  Multiple Vitamin (MULTIVITAMIN) tablet, Take 1 tablet by mouth daily. .  Omega-3 Fatty Acids (FISH OIL) 1000 MG CAPS, Take by mouth. .  Vitamin D, Ergocalciferol, (DRISDOL) 1.25 MG (50000 UNIT) CAPS capsule, Take 1 capsule (50,000 Units total) by mouth every 7 (seven) days.   Reviewed prior external information including notes and imaging from  primary care provider As well as notes that were available from care everywhere and other healthcare systems.  Past  medical history, social, surgical and family history all reviewed in electronic medical record.  No pertanent information unless stated regarding to the chief complaint.   Review of Systems:  No headache, visual changes, nausea, vomiting, diarrhea, constipation, dizziness, abdominal pain, skin rash, fevers, chills, night sweats, weight loss, swollen lymph nodes, body aches, joint swelling, chest pain, shortness of breath, mood changes. POSITIVE muscle aches  Objective  Blood pressure 110/76, pulse  93, height 5\' 5"  (1.651 m), weight 226 lb (102.5 kg), last menstrual period 10/14/2000, SpO2 98 %.   General: No apparent distress alert and oriented x3 mood and affect normal, dressed appropriately.  HEENT: Pupils equal, extraocular movements intact  Respiratory: Patient's speak in full sentences and does not appear short of breath  Cardiovascular: No lower extremity edema, non tender, no erythema  Gait mild antalgic Left hip exam does have significant decrease in internal range of motion of only 5 degrees.  Patient does have significant tightness of the hip flexors and the hip abductors.  Neurovascularly intact distally.  Positive fulcrum noted   Impression and Recommendations:     The above documentation has been reviewed and is accurate and complete Lyndal Pulley, DO

## 2020-02-04 NOTE — Patient Instructions (Addendum)
Good to see you Hip replacement is the way to go Once weekly virtamin D COQ10 200 mcg Tart Cherry 1200 mg  I am here if you need me

## 2020-02-06 DIAGNOSIS — M1612 Unilateral primary osteoarthritis, left hip: Secondary | ICD-10-CM | POA: Diagnosis not present

## 2020-02-10 DIAGNOSIS — M1612 Unilateral primary osteoarthritis, left hip: Secondary | ICD-10-CM | POA: Diagnosis not present

## 2020-02-15 ENCOUNTER — Encounter: Payer: Self-pay | Admitting: Orthopaedic Surgery

## 2020-02-15 ENCOUNTER — Ambulatory Visit: Payer: BC Managed Care – PPO | Admitting: Orthopaedic Surgery

## 2020-02-15 VITALS — Ht 65.0 in | Wt 226.0 lb

## 2020-02-15 DIAGNOSIS — M1612 Unilateral primary osteoarthritis, left hip: Secondary | ICD-10-CM | POA: Diagnosis not present

## 2020-02-15 NOTE — Progress Notes (Signed)
Office Visit Note   Patient: Bridget Joyce           Date of Birth: [birth year]           MRN: [id number] Visit Date: 02/15/2020              Requested by: No referring provider defined for this encounter. PCP: Patient, No Pcp Per   Assessment & Plan: Visit Diagnoses:  1. Unilateral primary osteoarthritis, left hip     Plan: Given a combination of the patient's plain film findings, her MRI findings of the left hip and her left hip exam, we are recommending a total hip arthroplasty through direct anterior approach at this standpoint.  She is also had steroid injections in the past x2 that used to help with that left hip and now does not.  She has a significant Trendelenburg gait.  Given the detrimental effect this is having on her quality of life and her mobility we are recommending hip replacement surgery.  I explained in detail what this type of surgery involves.  We talked about the risk and benefits of surgery as well.  I described her interoperative and postoperative course.  I went over hip model and explained in detail what the surgery involves.  I gave her handout about this as well.  She is interested in having this scheduled hopefully in the near future.  We will work on getting this scheduled.  All questions and concerns were answered and addressed.  Follow-Up Instructions: Return for 2 weeks post-op.   Orders:  No orders of the defined types were placed in this encounter.  No orders of the defined types were placed in this encounter.     Procedures: No procedures performed   Clinical Data: No additional findings.   Subjective: Chief Complaint  Patient presents with  . Left Hip - Pain  The patient is a very pleasant 64 year old female who comes in for evaluation treatment of left hip pain has known osteoarthritis of that left hip as well as an MRI showing significant subchondral edema in the femoral head.  She did an exercise move with stretching that hip due to  chronic hip bursitis in late August of this year.  After then she had severe hip pain and weakness in that hip.  Some of that is resolving some but she still having hip pain that is definitely affecting her mobility, her quality of life and her actives daily living.  She has had at least 2 intra-articular steroid injections in that left hip the used to help but now do not.  She walks with a lurch to her gait and has a known leg length discrepancy surprisingly with her right asymptomatic side shorter than her left side that has the significant symptoms.  At this point her pain can be daily and it is 10 out of 10.  The muscle strain is gotten better but she still has a lot of groin pain that is deep within the left hip.  At this point she does wish to discuss hip replacement surgery.  She is not a diabetic.  She denies any acute change in her medical status or significant health issues.  HPI  Review of Systems She currently denies any headache, chest pain, shortness of breath, fever, chills, nausea, vomiting  Objective: Vital Signs: Ht 5\' 5"  (1.651 m)   Wt 226 lb (102.5 kg)   LMP 10/14/2000   BMI 37.61 kg/m   Physical Exam She is alert  and oriented x3 and in no acute distress Ortho Exam Examination of her left hip shows significant pain with internal and external rotation.  There is some stiffness with rotation as well.  Her right hip moves normally.  When I have her lay in the supine position there is a leg length discrepancy with her left side actually longer than the right side. Specialty Comments:  No specialty comments available.  Imaging: No results found. I did independently review the plain films of her pelvis and left hip as well as the MRI of the left hip.  There is significant joint space narrowing with the left hip.  The MRI shows subchondral edema as well as a large joint effusion.  There is cartilage thinning that is significant as well.  PMFS History: Patient Active Problem  List   Diagnosis Date Noted  . Unilateral primary osteoarthritis, left hip 02/15/2020  . Arthritis of left hip 02/04/2020  . GAD (generalized anxiety disorder) 04/14/2018  . Dysthymic disorder 04/14/2018  . Primary osteoarthritis of right knee 01/25/2017  . Mixed hyperlipidemia 07/13/2014  . Mild persistent asthma   12/19/2013  . Anxiety   . Prediabetes   . Vitamin D deficiency   . Fibromyalgia   . Myopia 10/28/2012  . Morbid obesity (BMI 37.11) 06/10/2009  . GERD 05/26/2008  . Allergic rhinitis 05/20/2008  . Exercise-induced bronchospasm 05/20/2008  . Diverticulosis 09/02/2006   Past Medical History:  Diagnosis Date  . Allergy    rag weed, pollen,dust  . Ankle fracture 12/2017   Left   . Anxiety    mild; infrequent per patient  . Asthma   . Bursitis of hip   . Diverticulosis   . External hemorrhoids   . Fibroid   . Fibromyalgia   . GERD (gastroesophageal reflux disease)   . Obesity   . Peripheral edema    infrequent per patient  . Prediabetes   . Vitamin D deficiency     Family History  Problem Relation Age of Onset  . Hypertension Father   . Heart disease Father   . Diabetes Father   . Heart failure Father   . Uterine cancer Sister   . Diabetes Sister   . Stroke Mother   . Colon cancer Neg Hx   . Esophageal cancer Neg Hx   . Stomach cancer Neg Hx   . Rectal cancer Neg Hx     Past Surgical History:  Procedure Laterality Date  . ABDOMINAL HYSTERECTOMY  2002   ovaries remain  . COLONOSCOPY  09/02/2006   external hemorrhoids,diverticulosis  . KNEE SURGERY     Left  . LIPOMA EXCISION     Labial  . MANDIBLE SURGERY    . MOUTH SURGERY    . REPAIR ANKLE LIGAMENT  04-05-2014   x2  . TONSILLECTOMY     Social History   Occupational History  . Occupation: Event organiser: [employer]  Tobacco Use  . Smoking status: Never Smoker  . Smokeless tobacco: Never Used  Vaping Use  . Vaping Use: Never used  Substance and Sexual Activity  .  Alcohol use: No  . Drug use: No  . Sexual activity: Not Currently    Birth control/protection: Abstinence

## 2020-02-16 ENCOUNTER — Ambulatory Visit: Payer: BC Managed Care – PPO | Admitting: Family Medicine

## 2020-02-16 DIAGNOSIS — M9904 Segmental and somatic dysfunction of sacral region: Secondary | ICD-10-CM | POA: Diagnosis not present

## 2020-02-16 DIAGNOSIS — M9903 Segmental and somatic dysfunction of lumbar region: Secondary | ICD-10-CM | POA: Diagnosis not present

## 2020-02-16 DIAGNOSIS — M25552 Pain in left hip: Secondary | ICD-10-CM | POA: Diagnosis not present

## 2020-02-16 DIAGNOSIS — M545 Low back pain, unspecified: Secondary | ICD-10-CM | POA: Diagnosis not present

## 2020-02-17 DIAGNOSIS — M1612 Unilateral primary osteoarthritis, left hip: Secondary | ICD-10-CM | POA: Diagnosis not present

## 2020-02-18 ENCOUNTER — Encounter: Payer: Self-pay | Admitting: Orthopaedic Surgery

## 2020-02-24 ENCOUNTER — Telehealth: Payer: Self-pay

## 2020-02-24 DIAGNOSIS — M25552 Pain in left hip: Secondary | ICD-10-CM | POA: Diagnosis not present

## 2020-02-24 NOTE — Telephone Encounter (Signed)
AEX 05/2019 PMP, no HRT TAH 2002 d/t fibroids  Spoke with pt. Pt states finding lump in upper vagina, left groin area 3 days ago. Pt states feels "hard" and unsure of size. Denies redness, pain, warmth, vaginal sx or abd upset. Pt advised to have OV for further evaluation. Pt agreeable. Pt offered multiple appts, declines due to work schedule. Only prefers to see Dr Sabra Heck. Pt scheduled as OV on 10/29 at 1145 am. Pt verbalized understanding to date and time of appt.  Routing to Dr Sabra Heck for review, please advise if not ok to proceed as scheduled.

## 2020-02-24 NOTE — Telephone Encounter (Signed)
Patient is calling in regards to a "knot in vaginal area".

## 2020-02-24 NOTE — Telephone Encounter (Signed)
You've given options to pt that she declined so I think this appointment is fine.  Thanks.

## 2020-02-24 NOTE — Telephone Encounter (Signed)
Encounter closed

## 2020-02-25 NOTE — Progress Notes (Signed)
GYNECOLOGY  VISIT  CC:   Vulvar cyst  HPI: 64 y.o. G0P0 Single White or Caucasian female here for cyst in groin area that she's noted in the past week.  Feels a little tenderness.  Has not noted any new symptoms.  Denies vaginal discharge or vaginal bleeding.  GYNECOLOGIC HISTORY: Patient's last menstrual period was 10/14/2000. Contraception: hysterectomy Menopausal hormone therapy: none  Patient Active Problem List   Diagnosis Date Noted  . Unilateral primary osteoarthritis, left hip 02/15/2020  . Arthritis of left hip 02/04/2020  . GAD (generalized anxiety disorder) 04/14/2018  . Dysthymic disorder 04/14/2018  . Primary osteoarthritis of right knee 01/25/2017  . Mixed hyperlipidemia 07/13/2014  . Mild persistent asthma   12/19/2013  . Anxiety   . Prediabetes   . Vitamin D deficiency   . Fibromyalgia   . Myopia 10/28/2012  . Morbid obesity (BMI 37.11) 06/10/2009  . GERD 05/26/2008  . Allergic rhinitis 05/20/2008  . Exercise-induced bronchospasm 05/20/2008  . Diverticulosis 09/02/2006    Past Medical History:  Diagnosis Date  . Allergy    rag weed, pollen,dust  . Ankle fracture 12/2017   Left   . Anxiety    mild; infrequent per patient  . Asthma   . Bursitis of hip   . Diverticulosis   . External hemorrhoids   . Fibroid   . Fibromyalgia   . GERD (gastroesophageal reflux disease)   . Obesity   . Peripheral edema    infrequent per patient  . Prediabetes   . Vitamin D deficiency     Past Surgical History:  Procedure Laterality Date  . ABDOMINAL HYSTERECTOMY  2002   ovaries remain  . COLONOSCOPY  09/02/2006   external hemorrhoids,diverticulosis  . KNEE SURGERY     Left  . LIPOMA EXCISION     Labial  . MANDIBLE SURGERY    . MOUTH SURGERY    . REPAIR ANKLE LIGAMENT  04-05-2014   x2  . TONSILLECTOMY      MEDS:   Current Outpatient Medications on File Prior to Visit  Medication Sig Dispense Refill  . ALPRAZolam (XANAX) 0.5 MG tablet Take 1 tablet (0.5  mg total) by mouth 2 (two) times daily as needed for anxiety. 60 tablet 2  . Ascorbic Acid (VITAMIN C PO) Take by mouth daily.    . Beclomethasone Dipropionate (QVAR IN) Qvar RediHaler 80 mcg/actuation HFA breath activated aerosol  TAKE 1 PUFF BY MOUTH TWICE A DAY    . bumetanide (BUMEX) 1 MG tablet Take 1 tablet (1 mg total) by mouth daily as needed. For fluid. 30 tablet 2  . busPIRone (BUSPAR) 15 MG tablet Take 1 tablet (15 mg total) by mouth 2 (two) times daily. (Patient taking differently: Take 15 mg by mouth at bedtime. ) 180 tablet 1  . CALCIUM PO Take by mouth.    . ciprofloxacin-dexamethasone (CIPRODEX) OTIC suspension     . cyclobenzaprine (FLEXERIL) 10 MG tablet     . magnesium 30 MG tablet Take 30 mg by mouth 2 (two) times daily.    . meloxicam (MOBIC) 7.5 MG tablet Take 7.5 mg by mouth 2 (two) times daily.    . Multiple Vitamin (MULTIVITAMIN) tablet Take 1 tablet by mouth daily.    . Omega-3 Fatty Acids (FISH OIL) 1000 MG CAPS Take by mouth.    Marland Kitchen PROAIR HFA 108 (90 Base) MCG/ACT inhaler     . Vitamin D, Ergocalciferol, (DRISDOL) 1.25 MG (50000 UNIT) CAPS capsule Take 1 capsule (50,000  Units total) by mouth every 7 (seven) days. 12 capsule 0   No current facility-administered medications on file prior to visit.    ALLERGIES: [place] grass extract, Grass pollen(k-o-r-t-swt vern), Mold extract  [trichophyton], Molds & smuts, Other, and Short ragweed pollen ext  Family History  Problem Relation Age of Onset  . Hypertension Father   . Heart disease Father   . Diabetes Father   . Heart failure Father   . Uterine cancer Sister   . Diabetes Sister   . Stroke Mother   . Colon cancer Neg Hx   . Esophageal cancer Neg Hx   . Stomach cancer Neg Hx   . Rectal cancer Neg Hx     SH:  Single, non smoker  Review of Systems  Constitutional: Negative.   HENT: Negative.   Eyes: Negative.   Respiratory: Negative.   Cardiovascular: Negative.   Gastrointestinal: Negative.    Endocrine: Negative.   Genitourinary:       Cyst in groin area  Musculoskeletal: Negative.   Skin: Negative.   Allergic/Immunologic: Negative.   Neurological: Negative.   Hematological: Negative.   Psychiatric/Behavioral: Negative.     PHYSICAL EXAMINATION:    BP 118/66   Pulse 70   Resp 16   Wt 227 lb (103 kg)   LMP 10/14/2000   BMI 37.77 kg/m     General appearance: alert, cooperative and appears stated age Lymph:  Left groin nodule about 1 x 2cm, mildly tender  Pelvic: External genitalia:  no lesions               Chaperone, Terence Lux, CMA, was present for exam.  Assessment: Groin mass/nodule/cyst  Plan: CBC with diff obtained today.  Will plan to image the groin to see if can determine if this is a sebaceous cyst or lymph node

## 2020-02-26 ENCOUNTER — Other Ambulatory Visit: Payer: Self-pay

## 2020-02-26 ENCOUNTER — Encounter: Payer: Self-pay | Admitting: Obstetrics & Gynecology

## 2020-02-26 ENCOUNTER — Ambulatory Visit (INDEPENDENT_AMBULATORY_CARE_PROVIDER_SITE_OTHER): Payer: BC Managed Care – PPO | Admitting: Obstetrics & Gynecology

## 2020-02-26 VITALS — BP 118/66 | HR 70 | Resp 16 | Wt 227.0 lb

## 2020-02-26 DIAGNOSIS — M25552 Pain in left hip: Secondary | ICD-10-CM | POA: Diagnosis not present

## 2020-02-26 DIAGNOSIS — R1909 Other intra-abdominal and pelvic swelling, mass and lump: Secondary | ICD-10-CM

## 2020-02-27 LAB — CBC WITH DIFFERENTIAL/PLATELET
Basophils Absolute: 0 10*3/uL (ref 0.0–0.2)
Basos: 1 %
EOS (ABSOLUTE): 0.2 10*3/uL (ref 0.0–0.4)
Eos: 3 %
Hematocrit: 40.1 % (ref 34.0–46.6)
Hemoglobin: 13.1 g/dL (ref 11.1–15.9)
Immature Grans (Abs): 0 10*3/uL (ref 0.0–0.1)
Immature Granulocytes: 0 %
Lymphocytes Absolute: 2.1 10*3/uL (ref 0.7–3.1)
Lymphs: 36 %
MCH: 31.3 pg (ref 26.6–33.0)
MCHC: 32.7 g/dL (ref 31.5–35.7)
MCV: 96 fL (ref 79–97)
Monocytes Absolute: 0.4 10*3/uL (ref 0.1–0.9)
Monocytes: 7 %
Neutrophils Absolute: 3.1 10*3/uL (ref 1.4–7.0)
Neutrophils: 53 %
Platelets: 310 10*3/uL (ref 150–450)
RBC: 4.18 x10E6/uL (ref 3.77–5.28)
RDW: 12.7 % (ref 11.7–15.4)
WBC: 5.8 10*3/uL (ref 3.4–10.8)

## 2020-02-29 DIAGNOSIS — M25552 Pain in left hip: Secondary | ICD-10-CM | POA: Diagnosis not present

## 2020-03-01 ENCOUNTER — Telehealth: Payer: Self-pay

## 2020-03-01 NOTE — Telephone Encounter (Signed)
Spoke with patient regarding benefits for recommended ultrasound. Patient is aware that ultrasound is transvaginal. Patient acknowledges understanding of information presented. Patient is aware of cancellation policy. Patient scheduled appointment for 03/10/2020 at 0100PM with M. Edwinna Areola, MD.   Patient is wanting to confirm that ultrasound is a transvaginal ultrasound. Patient stated that nodule in question is in the crease of her leg, not inside of the vagina.  Routing to Dr. Sabra Heck for clarification.

## 2020-03-01 NOTE — Telephone Encounter (Signed)
This is just a groin ultrasound.  Kaitlyn checked with Brilynn that this is something the ultrasonographer can do and it is.  Please make notation with appt about left groin mass.  Thanks.

## 2020-03-07 DIAGNOSIS — M25552 Pain in left hip: Secondary | ICD-10-CM | POA: Diagnosis not present

## 2020-03-09 ENCOUNTER — Telehealth: Payer: Self-pay | Admitting: Orthopaedic Surgery

## 2020-03-09 ENCOUNTER — Telehealth: Payer: Self-pay | Admitting: *Deleted

## 2020-03-09 ENCOUNTER — Other Ambulatory Visit: Payer: Self-pay | Admitting: Family Medicine

## 2020-03-09 DIAGNOSIS — M5136 Other intervertebral disc degeneration, lumbar region: Secondary | ICD-10-CM | POA: Diagnosis not present

## 2020-03-09 DIAGNOSIS — M25552 Pain in left hip: Secondary | ICD-10-CM | POA: Diagnosis not present

## 2020-03-09 DIAGNOSIS — K4090 Unilateral inguinal hernia, without obstruction or gangrene, not specified as recurrent: Secondary | ICD-10-CM

## 2020-03-09 DIAGNOSIS — R1909 Other intra-abdominal and pelvic swelling, mass and lump: Secondary | ICD-10-CM

## 2020-03-09 DIAGNOSIS — M9903 Segmental and somatic dysfunction of lumbar region: Secondary | ICD-10-CM | POA: Diagnosis not present

## 2020-03-09 NOTE — Telephone Encounter (Signed)
I do have CD.  I called patient and advised.  Put at front desk for pick up.

## 2020-03-09 NOTE — Progress Notes (Signed)
Several day h/o L groin swelling. No pain, change in bowels, incarceration. Worse when standing or ambulating. Improves when laying flat.

## 2020-03-09 NOTE — Telephone Encounter (Signed)
Patient left msg w/ receptionist, would like to pickup xrays today. Callback [phone]

## 2020-03-09 NOTE — Telephone Encounter (Signed)
Order signed.

## 2020-03-09 NOTE — Telephone Encounter (Signed)
Spoke with patient. Patient is returning call in regards to PUS scheduled in office on 03/10/20 w/ Dr. Sabra Heck. Advised patient reviewed with Dr. Sabra Heck on 03/04/20, recommends referral to general surgeon for further evaluation. Patient agreeable to plan. Patients states she plans to transfer care to [facility], but would like to proceed with referral now as recommended.  Advised patient I will review with Dr. Talbert Nan, will plan to  proceed with referral to [facility], our office will notify of any additional recommendations. Patient agreeable.   PUS cancelled for 03/10/20  Order pended for urgent referral to general surgeon.   Routing to Dr. Talbert Nan for review.   Cc: Hayley Carder

## 2020-03-10 ENCOUNTER — Other Ambulatory Visit: Payer: Self-pay | Admitting: Obstetrics & Gynecology

## 2020-03-10 ENCOUNTER — Other Ambulatory Visit: Payer: BC Managed Care – PPO

## 2020-03-10 DIAGNOSIS — M1612 Unilateral primary osteoarthritis, left hip: Secondary | ICD-10-CM | POA: Diagnosis not present

## 2020-03-12 ENCOUNTER — Ambulatory Visit: Payer: BC Managed Care – PPO | Attending: Internal Medicine

## 2020-03-12 ENCOUNTER — Other Ambulatory Visit: Payer: Self-pay

## 2020-03-12 DIAGNOSIS — Z23 Encounter for immunization: Secondary | ICD-10-CM

## 2020-03-12 NOTE — Progress Notes (Signed)
   Covid-19 Vaccination Clinic  Name:  [name]    MRN: [id number] DOB: 11-27-1955  03/12/2020  [name] was observed post Covid-19 immunization for 15 minutes without incident. She was provided with Vaccine Information Sheet and instruction to access the V-Safe system.   [name] was instructed to call 911 with any severe reactions post vaccine: Marland Kitchen Difficulty breathing  . Swelling of face and throat  . A fast heartbeat  . A bad rash all over body  . Dizziness and weakness   Immunizations Administered    Name Date Dose VIS Date Route   Pfizer COVID-19 Vaccine 03/12/2020  4:46 PM 0.3 mL 02/17/2020 Intramuscular   Manufacturer: [place]   Lot: [id number]   [place]: [id number]

## 2020-03-15 ENCOUNTER — Ambulatory Visit: Payer: BC Managed Care – PPO | Admitting: Orthopaedic Surgery

## 2020-03-16 DIAGNOSIS — M5136 Other intervertebral disc degeneration, lumbar region: Secondary | ICD-10-CM | POA: Diagnosis not present

## 2020-03-16 DIAGNOSIS — M9903 Segmental and somatic dysfunction of lumbar region: Secondary | ICD-10-CM | POA: Diagnosis not present

## 2020-03-16 DIAGNOSIS — M25552 Pain in left hip: Secondary | ICD-10-CM | POA: Diagnosis not present

## 2020-03-17 ENCOUNTER — Ambulatory Visit
Admission: RE | Admit: 2020-03-17 | Discharge: 2020-03-17 | Disposition: A | Discharge status: Home / Self Care | Payer: BC Managed Care – PPO | Source: Ambulatory Visit | Attending: Family Medicine | Admitting: Family Medicine

## 2020-03-17 ENCOUNTER — Other Ambulatory Visit: Payer: Self-pay | Admitting: Family Medicine

## 2020-03-17 DIAGNOSIS — H01114 Allergic dermatitis of left upper eyelid: Secondary | ICD-10-CM | POA: Diagnosis not present

## 2020-03-17 DIAGNOSIS — R1909 Other intra-abdominal and pelvic swelling, mass and lump: Secondary | ICD-10-CM | POA: Diagnosis not present

## 2020-03-17 DIAGNOSIS — H01111 Allergic dermatitis of right upper eyelid: Secondary | ICD-10-CM | POA: Diagnosis not present

## 2020-03-17 DIAGNOSIS — K4090 Unilateral inguinal hernia, without obstruction or gangrene, not specified as recurrent: Secondary | ICD-10-CM

## 2020-03-21 ENCOUNTER — Other Ambulatory Visit: Payer: Self-pay

## 2020-03-21 ENCOUNTER — Ambulatory Visit (INDEPENDENT_AMBULATORY_CARE_PROVIDER_SITE_OTHER): Payer: BC Managed Care – PPO | Admitting: Orthopaedic Surgery

## 2020-03-21 VITALS — Ht 65.0 in | Wt 227.0 lb

## 2020-03-21 DIAGNOSIS — M1612 Unilateral primary osteoarthritis, left hip: Secondary | ICD-10-CM | POA: Diagnosis not present

## 2020-03-21 NOTE — Progress Notes (Signed)
The patient is a very pleasant 64 year old female that we have scheduled for a left total hip arthroplasty on December 14.  She came in today with appropriate questions about the hip surgery itself and about what she has been dealing with in terms of her perceived leg length discrepancy and a Trendelenburg gait as a relates to her body compensating for the severity of the pain in her left hip.  We had a long and appropriate thorough discussion about what to expect intraoperatively and postoperatively and how I go about the surgery in terms of trying to get a stable hip as possible and correcting for offset and leg length.  We talked about her interoperative and postoperative course as well as what therapy will involve after surgery in the hospital and with home health therapy.  All questions and concerns were answered and addressed.  We will see her on December 14 for her left anterior hip replacement.

## 2020-03-28 ENCOUNTER — Encounter: Payer: Self-pay | Admitting: Orthopaedic Surgery

## 2020-03-31 ENCOUNTER — Telehealth: Payer: Self-pay | Admitting: Orthopaedic Surgery

## 2020-03-31 DIAGNOSIS — K1121 Acute sialoadenitis: Secondary | ICD-10-CM | POA: Diagnosis not present

## 2020-03-31 NOTE — Telephone Encounter (Signed)
Pt called stating she's having a hip replacement and miss placed the information Dr.Blackman gave her including a depuy brochure so she would like to have another copy left at the front desk so she can pick it up today if possible.  [phone]

## 2020-04-02 ENCOUNTER — Encounter: Payer: Self-pay | Admitting: Orthopaedic Surgery

## 2020-04-05 ENCOUNTER — Telehealth: Payer: Self-pay | Admitting: Orthopaedic Surgery

## 2020-04-05 ENCOUNTER — Other Ambulatory Visit: Payer: Self-pay | Admitting: Physician Assistant

## 2020-04-05 NOTE — Telephone Encounter (Signed)
Called patient left message to return call concerning [employer] disability forms received. Patient will need to pay $25.00 for forms to be completed by [employer]  Need to check to see if there is any additional paperwork that is needing to be forwarded to [facility]

## 2020-04-05 NOTE — Telephone Encounter (Signed)
Received Disability/FMLA form     Forwarding to [employer] today

## 2020-04-05 NOTE — Telephone Encounter (Signed)
Received call back from patient advised [employer] handles all [employer] disability and FMLA Paperwork. Also advised [employer] takes checks,cash or money orders. Emailed medical release form to patient.

## 2020-04-05 NOTE — Telephone Encounter (Signed)
Received Disability/FMLA form     Forwarding to [employer]

## 2020-04-06 ENCOUNTER — Encounter (HOSPITAL_COMMUNITY): Payer: Self-pay

## 2020-04-06 ENCOUNTER — Telehealth: Payer: Self-pay | Admitting: Orthopaedic Surgery

## 2020-04-06 ENCOUNTER — Inpatient Hospital Stay (HOSPITAL_COMMUNITY)
Admission: RE | Admit: 2020-04-06 | Discharge: 2020-04-06 | Disposition: A | Discharge status: Home / Self Care | Payer: BC Managed Care – PPO | Source: Ambulatory Visit

## 2020-04-06 NOTE — Telephone Encounter (Signed)
Received $25.00 check and disability/FMLA paperwork from patient    Forwarding to [facility]

## 2020-04-06 NOTE — Progress Notes (Signed)
[id number] - [place], [place] [address] AT [address] [place] [address] Phone: [phone] Fax: [phone]      Your procedure is scheduled on Tuesday, 04/12/20.  Report to [facility] Main Entrance "A" at 10 A.M., and check in at the Admitting office.  Call this number if you have problems the morning of surgery:  [phone]  Call [phone] if you have any questions prior to your surgery date Monday-Friday 8am-4pm    Remember:  Do not eat after midnight the night before your surgery Monday    Take these medicines the morning of surgery with A SIP OF WATER:    ALPRAZolam Duanne Moron) if needed  Ok to use Qvar inhaler  cyclobenzaprine (FLEXERIL)  Ok to use [employer]- Bring Tuesday  As of today, STOP taking any Aspirin (unless otherwise instructed by your surgeon) Aleve, Naproxen, Ibuprofen, Motrin, Advil, Goody's, BC's, all herbal medications, fish oil, and all vitamins.                      Do not wear jewelry, make up, or nail polish            Do not wear lotions, powders, perfumes, or deodorant.            Do not shave 48 hours prior to surgery.             Do not bring valuables to the hospital.            [facility] is not responsible for any belongings or valuables.  Do NOT Smoke (Tobacco/Vaping) or drink Alcohol 24 hours prior to your procedure If you use a CPAP at night, you may bring all equipment for your overnight stay.   Contacts, glasses, dentures or bridgework may not be worn into surgery.      For patients admitted to the hospital, discharge time will be determined by your treatment team.   Patients discharged the day of surgery will not be allowed to drive home, and someone needs to stay with them for 24 hours.    Special instructions:   [facility]- Preparing For Surgery  Before surgery, you can play an important role. Because skin is not sterile, your skin needs to be as free of  germs as possible. You can reduce the number of germs on your skin by washing with CHG (chlorahexidine gluconate) Soap before surgery.  CHG is an antiseptic cleaner which kills germs and bonds with the skin to continue killing germs even after washing.    Oral Hygiene is also important to reduce your risk of infection.  Remember - BRUSH YOUR TEETH THE MORNING OF SURGERY WITH YOUR REGULAR TOOTHPASTE  Please do not use if you have an allergy to CHG or antibacterial soaps. If your skin becomes reddened/irritated stop using the CHG.  Do not shave (including legs and underarms) for at least 48 hours prior to first CHG shower. It is OK to shave your face.  Please follow these instructions carefully.   1. Shower the NIGHT BEFORE SURGERY Mon and the MORNING OF SURGERY Tues with CHG Soap.   2. If you chose to wash your hair, wash your hair first as usual with your normal shampoo.  3. After you shampoo, rinse your hair and body thoroughly to remove the shampoo.  4. Use CHG as you would any other liquid soap. You can apply CHG directly to the skin and wash gently  with a scrungie or a clean washcloth.   5. Apply the CHG Soap to your body ONLY FROM THE NECK DOWN.  Do not use on open wounds or open sores. Avoid contact with your eyes, ears, mouth and genitals (private parts). Wash Face and genitals (private parts)  with your normal soap.   6. Wash thoroughly, paying special attention to the area where your surgery will be performed.  7. Thoroughly rinse your body with warm water from the neck down.  8. DO NOT shower/wash with your normal soap after using and rinsing off the CHG Soap.  9. Pat yourself dry with a CLEAN TOWEL.  10. Wear CLEAN PAJAMAS to bed the night before surgery  11. Place CLEAN SHEETS on your bed the night of your first shower and DO NOT SLEEP WITH PETS.   Day of Surgery: Wear Clean/Comfortable clothing the morning of surgery Do not apply any deodorants/lotions.   Remember to  brush your teeth WITH YOUR REGULAR TOOTHPASTE.   Please read over the following fact sheets that you were given.

## 2020-04-06 NOTE — Progress Notes (Signed)
error 

## 2020-04-07 ENCOUNTER — Encounter (HOSPITAL_COMMUNITY): Payer: Self-pay | Admitting: Orthopaedic Surgery

## 2020-04-07 ENCOUNTER — Other Ambulatory Visit: Payer: Self-pay

## 2020-04-07 DIAGNOSIS — M9903 Segmental and somatic dysfunction of lumbar region: Secondary | ICD-10-CM | POA: Diagnosis not present

## 2020-04-07 DIAGNOSIS — M545 Low back pain, unspecified: Secondary | ICD-10-CM | POA: Diagnosis not present

## 2020-04-07 DIAGNOSIS — M542 Cervicalgia: Secondary | ICD-10-CM | POA: Diagnosis not present

## 2020-04-07 DIAGNOSIS — M9904 Segmental and somatic dysfunction of sacral region: Secondary | ICD-10-CM | POA: Diagnosis not present

## 2020-04-07 NOTE — Progress Notes (Incomplete)
COVID Vaccine Completed: Yes Date COVID Vaccine completed: x3 COVID vaccine manufacturer: Pfizer       PCP - Waldemar Dickens, MD Cardiologist - N/A  Chest x-ray - greater than 1 year EKG - greater than 1 year Stress Test - greater than 2 years ECHO - N/A Cardiac Cath - N/A Pacemaker/ICD device last checked: N/A   Sleep Study - N/A CPAP - N/A  Fasting Blood Sugar - N/A Checks Blood Sugar __N/A___ times a day  Blood Thinner Instructions: Aspirin Instructions: Last Dose:  Activity level:  Unable to go up a flight of stairs without symptoms   Can go up a flight of stairs without stopping and without symptoms   Able to exercise without symptoms     Anesthesia review:   Patient denies shortness of breath, fever, cough and chest pain at PAT appointment   Patient verbalized understanding of instructions that were given to them at the PAT appointment. Patient was also instructed that they will need to review over the PAT instructions again at home before surgery.

## 2020-04-08 ENCOUNTER — Other Ambulatory Visit (HOSPITAL_COMMUNITY): Payer: BC Managed Care – PPO

## 2020-04-09 ENCOUNTER — Other Ambulatory Visit (HOSPITAL_COMMUNITY): Payer: BC Managed Care – PPO

## 2020-04-09 ENCOUNTER — Other Ambulatory Visit (HOSPITAL_COMMUNITY)
Admission: RE | Admit: 2020-04-09 | Discharge: 2020-04-09 | Disposition: A | Discharge status: Home / Self Care | Payer: BC Managed Care – PPO | Source: Ambulatory Visit | Attending: Orthopaedic Surgery | Admitting: Orthopaedic Surgery

## 2020-04-09 DIAGNOSIS — Z20822 Contact with and (suspected) exposure to covid-19: Secondary | ICD-10-CM | POA: Insufficient documentation

## 2020-04-09 DIAGNOSIS — Z01812 Encounter for preprocedural laboratory examination: Secondary | ICD-10-CM | POA: Diagnosis present

## 2020-04-10 LAB — SARS CORONAVIRUS 2 (TAT 6-24 HRS): SARS Coronavirus 2: NEGATIVE

## 2020-04-11 ENCOUNTER — Telehealth: Payer: Self-pay | Admitting: Orthopaedic Surgery

## 2020-04-11 NOTE — Telephone Encounter (Signed)
Pt called She is having surgery tomorrow and is requesting a handicap placker. Please call her at [phone]

## 2020-04-12 ENCOUNTER — Inpatient Hospital Stay (HOSPITAL_COMMUNITY): Payer: BC Managed Care – PPO

## 2020-04-12 ENCOUNTER — Encounter (HOSPITAL_COMMUNITY): Payer: Self-pay | Admitting: Orthopaedic Surgery

## 2020-04-12 ENCOUNTER — Inpatient Hospital Stay (HOSPITAL_COMMUNITY)
Admission: RE | Admit: 2020-04-12 | Discharge: 2020-04-19 | DRG: 470 | Disposition: A | Discharge status: Home Health | Payer: BC Managed Care – PPO | Attending: Orthopaedic Surgery | Admitting: Orthopaedic Surgery

## 2020-04-12 ENCOUNTER — Inpatient Hospital Stay (HOSPITAL_COMMUNITY): Payer: BC Managed Care – PPO | Admitting: Anesthesiology

## 2020-04-12 ENCOUNTER — Encounter (HOSPITAL_COMMUNITY)
Admission: RE | Disposition: A | Discharge status: Home Health | Payer: Self-pay | Source: Home / Self Care | Attending: Orthopaedic Surgery

## 2020-04-12 ENCOUNTER — Other Ambulatory Visit: Payer: Self-pay

## 2020-04-12 DIAGNOSIS — M2170 Unequal limb length (acquired), unspecified site: Secondary | ICD-10-CM | POA: Diagnosis present

## 2020-04-12 DIAGNOSIS — E559 Vitamin D deficiency, unspecified: Secondary | ICD-10-CM | POA: Diagnosis not present

## 2020-04-12 DIAGNOSIS — Z8249 Family history of ischemic heart disease and other diseases of the circulatory system: Secondary | ICD-10-CM

## 2020-04-12 DIAGNOSIS — Z8049 Family history of malignant neoplasm of other genital organs: Secondary | ICD-10-CM

## 2020-04-12 DIAGNOSIS — Z833 Family history of diabetes mellitus: Secondary | ICD-10-CM

## 2020-04-12 DIAGNOSIS — S82122A Displaced fracture of lateral condyle of left tibia, initial encounter for closed fracture: Secondary | ICD-10-CM

## 2020-04-12 DIAGNOSIS — J4530 Mild persistent asthma, uncomplicated: Secondary | ICD-10-CM | POA: Diagnosis present

## 2020-04-12 DIAGNOSIS — Z823 Family history of stroke: Secondary | ICD-10-CM

## 2020-04-12 DIAGNOSIS — K219 Gastro-esophageal reflux disease without esophagitis: Secondary | ICD-10-CM | POA: Diagnosis not present

## 2020-04-12 DIAGNOSIS — M797 Fibromyalgia: Secondary | ICD-10-CM | POA: Diagnosis present

## 2020-04-12 DIAGNOSIS — Z471 Aftercare following joint replacement surgery: Secondary | ICD-10-CM | POA: Diagnosis not present

## 2020-04-12 DIAGNOSIS — M1612 Unilateral primary osteoarthritis, left hip: Principal | ICD-10-CM | POA: Diagnosis present

## 2020-04-12 DIAGNOSIS — Z20822 Contact with and (suspected) exposure to covid-19: Secondary | ICD-10-CM | POA: Diagnosis not present

## 2020-04-12 DIAGNOSIS — Z96642 Presence of left artificial hip joint: Secondary | ICD-10-CM | POA: Diagnosis not present

## 2020-04-12 DIAGNOSIS — Z90710 Acquired absence of both cervix and uterus: Secondary | ICD-10-CM

## 2020-04-12 DIAGNOSIS — F418 Other specified anxiety disorders: Secondary | ICD-10-CM | POA: Diagnosis not present

## 2020-04-12 DIAGNOSIS — R7303 Prediabetes: Secondary | ICD-10-CM | POA: Diagnosis present

## 2020-04-12 DIAGNOSIS — E782 Mixed hyperlipidemia: Secondary | ICD-10-CM | POA: Diagnosis not present

## 2020-04-12 HISTORY — PX: TOTAL HIP ARTHROPLASTY: SHX124

## 2020-04-12 HISTORY — DX: Migraine, unspecified, not intractable, without status migrainosus: G43.909

## 2020-04-12 HISTORY — DX: Prediabetes: R73.03

## 2020-04-12 LAB — CBC
HCT: 39 % (ref 36.0–46.0)
Hemoglobin: 12.7 g/dL (ref 12.0–15.0)
MCH: 31.1 pg (ref 26.0–34.0)
MCHC: 32.6 g/dL (ref 30.0–36.0)
MCV: 95.6 fL (ref 80.0–100.0)
Platelets: 291 10*3/uL (ref 150–400)
RBC: 4.08 MIL/uL (ref 3.87–5.11)
RDW: 12.5 % (ref 11.5–15.5)
WBC: 7.3 10*3/uL (ref 4.0–10.5)
nRBC: 0 % (ref 0.0–0.2)

## 2020-04-12 LAB — HEMOGLOBIN A1C
Hgb A1c MFr Bld: 6.1 % — ABNORMAL HIGH (ref 4.8–5.6)
Mean Plasma Glucose: 128.37 mg/dL

## 2020-04-12 LAB — BASIC METABOLIC PANEL
Anion gap: 10 (ref 5–15)
BUN: 15 mg/dL (ref 8–23)
CO2: 28 mmol/L (ref 22–32)
Calcium: 9.5 mg/dL (ref 8.9–10.3)
Chloride: 103 mmol/L (ref 98–111)
Creatinine, Ser: 0.91 mg/dL (ref 0.44–1.00)
GFR, Estimated: >60 mL/min (ref >60)
Glucose, Bld: 103 mg/dL — ABNORMAL HIGH (ref 70–99)
Potassium: 3.5 mmol/L (ref 3.5–5.1)
Sodium: 141 mmol/L (ref 135–145)

## 2020-04-12 LAB — TYPE AND SCREEN
ABO/RH(D): O POS
Antibody Screen: NEGATIVE

## 2020-04-12 LAB — SURGICAL PCR SCREEN
MRSA, PCR: NEGATIVE
Staphylococcus aureus: NEGATIVE

## 2020-04-12 LAB — ABO/RH: ABO/RH(D): O POS

## 2020-04-12 SURGERY — ARTHROPLASTY, HIP, TOTAL, ANTERIOR APPROACH
Anesthesia: Monitor Anesthesia Care | Site: Hip | Laterality: Left

## 2020-04-12 MED ORDER — TRANEXAMIC ACID-NACL 1000-0.7 MG/100ML-% IV SOLN
1000.0000 mg | INTRAVENOUS | Status: AC
  Administered 2020-04-12: 1000 mg via INTRAVENOUS
  Filled 2020-04-12: qty 100

## 2020-04-12 MED ORDER — METHOCARBAMOL 500 MG IVPB - SIMPLE MED
INTRAVENOUS | Status: AC
  Filled 2020-04-12: qty 50

## 2020-04-12 MED ORDER — OXYCODONE HCL 5 MG PO TABS
5.0000 mg | ORAL_TABLET | ORAL | Status: DC | PRN
  Administered 2020-04-12 (×2): 5 mg via ORAL
  Administered 2020-04-13 (×4): 10 mg via ORAL
  Administered 2020-04-14 – 2020-04-17 (×3): 5 mg via ORAL
  Filled 2020-04-12 (×2): qty 2
  Filled 2020-04-12 (×2): qty 1
  Filled 2020-04-12 (×3): qty 2
  Filled 2020-04-12: qty 1
  Filled 2020-04-12: qty 2
  Filled 2020-04-12: qty 1

## 2020-04-12 MED ORDER — GABAPENTIN 100 MG PO CAPS
100.0000 mg | ORAL_CAPSULE | Freq: Three times a day (TID) | ORAL | Status: DC
  Administered 2020-04-12 – 2020-04-19 (×21): 100 mg via ORAL
  Filled 2020-04-12 (×21): qty 1

## 2020-04-12 MED ORDER — BUDESONIDE 0.25 MG/2ML IN SUSP
0.2500 mg | Freq: Two times a day (BID) | RESPIRATORY_TRACT | Status: DC
  Filled 2020-04-12: qty 2

## 2020-04-12 MED ORDER — ASCORBIC ACID 500 MG PO TABS
1000.0000 mg | ORAL_TABLET | Freq: Every day | ORAL | Status: DC
  Administered 2020-04-13 – 2020-04-19 (×7): 1000 mg via ORAL
  Filled 2020-04-12 (×8): qty 2

## 2020-04-12 MED ORDER — ONDANSETRON HCL 4 MG/2ML IJ SOLN: 4.0000 mg | Freq: Four times a day (QID) | INTRAMUSCULAR | Status: DC | PRN

## 2020-04-12 MED ORDER — SODIUM CHLORIDE 0.9 % IV SOLN: INTRAVENOUS | Status: DC

## 2020-04-12 MED ORDER — OLOPATADINE HCL 0.1 % OP SOLN
1.0000 [drp] | Freq: Every day | OPHTHALMIC | Status: DC
  Administered 2020-04-16: 1 [drp] via OPHTHALMIC
  Filled 2020-04-12: qty 5

## 2020-04-12 MED ORDER — PROPOFOL 10 MG/ML IV BOLUS
INTRAVENOUS | Status: AC
  Filled 2020-04-12: qty 20

## 2020-04-12 MED ORDER — BUPIVACAINE IN DEXTROSE 0.75-8.25 % IT SOLN
INTRATHECAL | Status: DC | PRN
  Administered 2020-04-12: 1.6 mL via INTRATHECAL

## 2020-04-12 MED ORDER — BISACODYL 10 MG RE SUPP: 10.0000 mg | Freq: Every day | RECTAL | Status: DC | PRN

## 2020-04-12 MED ORDER — METOCLOPRAMIDE HCL 5 MG PO TABS
5.0000 mg | ORAL_TABLET | Freq: Three times a day (TID) | ORAL | Status: DC | PRN
  Filled 2020-04-12: qty 2

## 2020-04-12 MED ORDER — SODIUM CHLORIDE 0.9 % IR SOLN
Status: DC | PRN
  Administered 2020-04-12: 2000 mL

## 2020-04-12 MED ORDER — MENTHOL 3 MG MT LOZG: 1.0000 | LOZENGE | OROMUCOSAL | Status: DC | PRN

## 2020-04-12 MED ORDER — HYDROMORPHONE HCL 1 MG/ML IJ SOLN: 0.5000 mg | INTRAMUSCULAR | Status: DC | PRN

## 2020-04-12 MED ORDER — ACETAMINOPHEN 10 MG/ML IV SOLN
INTRAVENOUS | Status: AC
  Administered 2020-04-12: 1000 mg via INTRAVENOUS
  Filled 2020-04-12: qty 100

## 2020-04-12 MED ORDER — METHOCARBAMOL 500 MG IVPB - SIMPLE MED
500.0000 mg | Freq: Four times a day (QID) | INTRAVENOUS | Status: DC | PRN
  Administered 2020-04-12: 500 mg via INTRAVENOUS
  Filled 2020-04-12: qty 50

## 2020-04-12 MED ORDER — MIDAZOLAM HCL 2 MG/2ML IJ SOLN
INTRAMUSCULAR | Status: AC
  Filled 2020-04-12: qty 2

## 2020-04-12 MED ORDER — CEFAZOLIN SODIUM-DEXTROSE 2-4 GM/100ML-% IV SOLN
2.0000 g | INTRAVENOUS | Status: AC
  Administered 2020-04-12: 2 g via INTRAVENOUS
  Filled 2020-04-12: qty 100

## 2020-04-12 MED ORDER — CHLORHEXIDINE GLUCONATE 0.12 % MT SOLN
15.0000 mL | Freq: Once | OROMUCOSAL | Status: AC
  Administered 2020-04-12: 15 mL via OROMUCOSAL

## 2020-04-12 MED ORDER — ORAL CARE MOUTH RINSE: 15.0000 mL | Freq: Once | OROMUCOSAL | Status: AC

## 2020-04-12 MED ORDER — HYDROMORPHONE HCL 1 MG/ML IJ SOLN
INTRAMUSCULAR | Status: AC
  Administered 2020-04-12: 0.25 mg via INTRAVENOUS
  Filled 2020-04-12: qty 1

## 2020-04-12 MED ORDER — MAGNESIUM GLUCONATE 500 MG PO TABS
500.0000 mg | ORAL_TABLET | Freq: Every day | ORAL | Status: DC
  Administered 2020-04-18: 500 mg via ORAL
  Filled 2020-04-12 (×8): qty 1

## 2020-04-12 MED ORDER — ONDANSETRON HCL 4 MG/2ML IJ SOLN
INTRAMUSCULAR | Status: AC
  Filled 2020-04-12: qty 2

## 2020-04-12 MED ORDER — METOCLOPRAMIDE HCL 5 MG/ML IJ SOLN: 5.0000 mg | Freq: Three times a day (TID) | INTRAMUSCULAR | Status: DC | PRN

## 2020-04-12 MED ORDER — SODIUM CHLORIDE 0.9 % IR SOLN
Status: DC | PRN
  Administered 2020-04-12: 1000 mL

## 2020-04-12 MED ORDER — ONDANSETRON HCL 4 MG/2ML IJ SOLN: 4.0000 mg | Freq: Once | INTRAMUSCULAR | Status: DC | PRN

## 2020-04-12 MED ORDER — ACETAMINOPHEN 10 MG/ML IV SOLN: 1000.0000 mg | Freq: Once | INTRAVENOUS | Status: DC | PRN

## 2020-04-12 MED ORDER — PROPOFOL 10 MG/ML IV BOLUS
INTRAVENOUS | Status: DC | PRN
  Administered 2020-04-12 (×2): 20 mg via INTRAVENOUS

## 2020-04-12 MED ORDER — PHENYLEPHRINE HCL-NACL 10-0.9 MG/250ML-% IV SOLN
INTRAVENOUS | Status: DC | PRN
  Administered 2020-04-12: 40 ug/min via INTRAVENOUS

## 2020-04-12 MED ORDER — FENTANYL CITRATE (PF) 100 MCG/2ML IJ SOLN
INTRAMUSCULAR | Status: DC | PRN
  Administered 2020-04-12: 100 ug via INTRAVENOUS

## 2020-04-12 MED ORDER — MIDAZOLAM HCL 5 MG/5ML IJ SOLN
INTRAMUSCULAR | Status: DC | PRN
  Administered 2020-04-12: 2 mg via INTRAVENOUS

## 2020-04-12 MED ORDER — ONDANSETRON HCL 4 MG PO TABS
4.0000 mg | ORAL_TABLET | Freq: Four times a day (QID) | ORAL | Status: DC | PRN
  Filled 2020-04-12: qty 1

## 2020-04-12 MED ORDER — POLYETHYLENE GLYCOL 3350 17 G PO PACK
17.0000 g | PACK | Freq: Every day | ORAL | Status: DC | PRN
  Administered 2020-04-16: 17 g via ORAL
  Filled 2020-04-12: qty 1

## 2020-04-12 MED ORDER — DOCUSATE SODIUM 100 MG PO CAPS
100.0000 mg | ORAL_CAPSULE | Freq: Two times a day (BID) | ORAL | Status: DC
  Administered 2020-04-12 – 2020-04-19 (×14): 100 mg via ORAL
  Filled 2020-04-12 (×14): qty 1

## 2020-04-12 MED ORDER — LACTATED RINGERS IV SOLN: INTRAVENOUS | Status: DC

## 2020-04-12 MED ORDER — PROPOFOL 500 MG/50ML IV EMUL
INTRAVENOUS | Status: AC
  Filled 2020-04-12: qty 50

## 2020-04-12 MED ORDER — PANTOPRAZOLE SODIUM 40 MG PO TBEC
40.0000 mg | DELAYED_RELEASE_TABLET | Freq: Every day | ORAL | Status: DC
  Administered 2020-04-13 – 2020-04-19 (×7): 40 mg via ORAL
  Filled 2020-04-12 (×8): qty 1

## 2020-04-12 MED ORDER — PROPOFOL 500 MG/50ML IV EMUL
INTRAVENOUS | Status: DC | PRN
  Administered 2020-04-12: 75 ug/kg/min via INTRAVENOUS

## 2020-04-12 MED ORDER — ASPIRIN 81 MG PO CHEW
81.0000 mg | CHEWABLE_TABLET | Freq: Two times a day (BID) | ORAL | Status: DC
  Administered 2020-04-12 – 2020-04-19 (×14): 81 mg via ORAL
  Filled 2020-04-12 (×14): qty 1

## 2020-04-12 MED ORDER — FLUTICASONE PROPIONATE 50 MCG/ACT NA SUSP
1.0000 | Freq: Every day | NASAL | Status: DC
  Administered 2020-04-16 – 2020-04-19 (×4): 1 via NASAL
  Filled 2020-04-12: qty 16

## 2020-04-12 MED ORDER — ADULT MULTIVITAMIN W/MINERALS CH
1.0000 | ORAL_TABLET | Freq: Every day | ORAL | Status: DC
  Administered 2020-04-18: 1 via ORAL
  Filled 2020-04-12 (×5): qty 1

## 2020-04-12 MED ORDER — ALUM & MAG HYDROXIDE-SIMETH 200-200-20 MG/5ML PO SUSP
30.0000 mL | ORAL | Status: DC | PRN
  Administered 2020-04-14 – 2020-04-15 (×2): 30 mL via ORAL
  Filled 2020-04-12 (×3): qty 30

## 2020-04-12 MED ORDER — FENTANYL CITRATE (PF) 100 MCG/2ML IJ SOLN
INTRAMUSCULAR | Status: AC
  Filled 2020-04-12: qty 2

## 2020-04-12 MED ORDER — PHENOL 1.4 % MT LIQD: 1.0000 | OROMUCOSAL | Status: DC | PRN

## 2020-04-12 MED ORDER — DIPHENHYDRAMINE HCL 12.5 MG/5ML PO ELIX: 12.5000 mg | ORAL_SOLUTION | ORAL | Status: DC | PRN

## 2020-04-12 MED ORDER — ZOLPIDEM TARTRATE 5 MG PO TABS
5.0000 mg | ORAL_TABLET | Freq: Every evening | ORAL | Status: DC | PRN
  Filled 2020-04-12: qty 1

## 2020-04-12 MED ORDER — ONDANSETRON HCL 4 MG/2ML IJ SOLN
INTRAMUSCULAR | Status: DC | PRN
  Administered 2020-04-12: 4 mg via INTRAVENOUS

## 2020-04-12 MED ORDER — 0.9 % SODIUM CHLORIDE (POUR BTL) OPTIME
TOPICAL | Status: DC | PRN
  Administered 2020-04-12: 1000 mL

## 2020-04-12 MED ORDER — PHENYLEPHRINE 40 MCG/ML (10ML) SYRINGE FOR IV PUSH (FOR BLOOD PRESSURE SUPPORT)
PREFILLED_SYRINGE | INTRAVENOUS | Status: DC | PRN
  Administered 2020-04-12 (×5): 80 ug via INTRAVENOUS

## 2020-04-12 MED ORDER — EPHEDRINE SULFATE-NACL 50-0.9 MG/10ML-% IV SOSY
PREFILLED_SYRINGE | INTRAVENOUS | Status: DC | PRN
  Administered 2020-04-12 (×2): 10 mg via INTRAVENOUS

## 2020-04-12 MED ORDER — ACETAMINOPHEN 325 MG PO TABS
325.0000 mg | ORAL_TABLET | Freq: Four times a day (QID) | ORAL | Status: DC | PRN
  Administered 2020-04-13 – 2020-04-19 (×8): 650 mg via ORAL
  Filled 2020-04-12 (×9): qty 2

## 2020-04-12 MED ORDER — DEXAMETHASONE SODIUM PHOSPHATE 10 MG/ML IJ SOLN
INTRAMUSCULAR | Status: AC
  Filled 2020-04-12: qty 1

## 2020-04-12 MED ORDER — POVIDONE-IODINE 10 % EX SWAB
2.0000 "application " | Freq: Once | CUTANEOUS | Status: AC
  Administered 2020-04-12: 2 via TOPICAL

## 2020-04-12 MED ORDER — OXYCODONE HCL 5 MG PO TABS
10.0000 mg | ORAL_TABLET | ORAL | Status: DC | PRN
Start: 2020-04-12 — End: 2020-04-19
  Administered 2020-04-13 – 2020-04-14 (×4): 10 mg via ORAL
  Filled 2020-04-12 (×4): qty 2

## 2020-04-12 MED ORDER — DEXAMETHASONE SODIUM PHOSPHATE 10 MG/ML IJ SOLN
INTRAMUSCULAR | Status: DC | PRN
  Administered 2020-04-12: 10 mg via INTRAVENOUS

## 2020-04-12 MED ORDER — MAGNESIUM 200 MG PO TABS
400.0000 mg | ORAL_TABLET | Freq: Every day | ORAL | Status: DC
  Filled 2020-04-12: qty 2

## 2020-04-12 MED ORDER — CEFAZOLIN SODIUM-DEXTROSE 1-4 GM/50ML-% IV SOLN
1.0000 g | Freq: Four times a day (QID) | INTRAVENOUS | Status: AC
  Administered 2020-04-12 – 2020-04-13 (×2): 1 g via INTRAVENOUS
  Filled 2020-04-12 (×2): qty 50

## 2020-04-12 MED ORDER — BUSPIRONE HCL 5 MG PO TABS
15.0000 mg | ORAL_TABLET | Freq: Every day | ORAL | Status: DC
  Administered 2020-04-12 – 2020-04-18 (×7): 15 mg via ORAL
  Filled 2020-04-12 (×7): qty 3

## 2020-04-12 MED ORDER — HYDROMORPHONE HCL 1 MG/ML IJ SOLN
0.2500 mg | INTRAMUSCULAR | Status: DC | PRN
  Administered 2020-04-12: 0.25 mg via INTRAVENOUS

## 2020-04-12 MED ORDER — ZINC SULFATE 220 (50 ZN) MG PO CAPS
220.0000 mg | ORAL_CAPSULE | Freq: Every day | ORAL | Status: DC
  Administered 2020-04-16 – 2020-04-19 (×4): 220 mg via ORAL
  Filled 2020-04-12 (×7): qty 1

## 2020-04-12 MED ORDER — METHOCARBAMOL 500 MG PO TABS
500.0000 mg | ORAL_TABLET | Freq: Four times a day (QID) | ORAL | Status: DC | PRN
  Administered 2020-04-12 – 2020-04-19 (×14): 500 mg via ORAL
  Filled 2020-04-12 (×14): qty 1

## 2020-04-12 SURGICAL SUPPLY — 40 items
APL SKNCLS STERI-STRIP NONHPOA (GAUZE/BANDAGES/DRESSINGS)
BAG SPEC THK2 15X12 ZIP CLS (MISCELLANEOUS)
BAG ZIPLOCK 12X15 (MISCELLANEOUS) IMPLANT
BENZOIN TINCTURE PRP APPL 2/3 (GAUZE/BANDAGES/DRESSINGS) IMPLANT
BLADE SAW SGTL 18X1.27X75 (BLADE) ×2 IMPLANT
COLLAR OFFSET CORAIL SZ 11 HIP (Stem) IMPLANT
CORAIL OFFSET COLLAR SZ 11 HIP (Stem) ×2 IMPLANT
COVER PERINEAL POST (MISCELLANEOUS) ×2 IMPLANT
COVER SURGICAL LIGHT HANDLE (MISCELLANEOUS) ×2 IMPLANT
COVER WAND RF STERILE (DRAPES) ×2 IMPLANT
CUP SECTOR GRIPTON 50MM (Cup) ×1 IMPLANT
DRAPE STERI IOBAN 125X83 (DRAPES) ×2 IMPLANT
DRAPE U-SHAPE 47X51 STRL (DRAPES) ×4 IMPLANT
DRSG AQUACEL AG ADV 3.5X10 (GAUZE/BANDAGES/DRESSINGS) ×2 IMPLANT
DURAPREP 26ML APPLICATOR (WOUND CARE) ×2 IMPLANT
ELECT REM PT RETURN 15FT ADLT (MISCELLANEOUS) ×2 IMPLANT
GAUZE XEROFORM 1X8 LF (GAUZE/BANDAGES/DRESSINGS) ×2 IMPLANT
GLOVE BIO SURGEON STRL SZ7.5 (GLOVE) ×2 IMPLANT
GLOVE BIOGEL PI IND STRL 8 (GLOVE) ×2 IMPLANT
GLOVE BIOGEL PI INDICATOR 8 (GLOVE) ×2
GLOVE ECLIPSE 8.0 STRL XLNG CF (GLOVE) ×2 IMPLANT
GOWN STRL REUS W/TWL XL LVL3 (GOWN DISPOSABLE) ×4 IMPLANT
HANDPIECE INTERPULSE COAX TIP (DISPOSABLE) ×2
HEAD FEM STD 32X+1 STRL (Hips) ×1 IMPLANT
HOLDER FOLEY CATH W/STRAP (MISCELLANEOUS) ×2 IMPLANT
KIT TURNOVER KIT A (KITS) IMPLANT
LINER ACETABULAR 32X50 (Liner) ×1 IMPLANT
PACK ANTERIOR HIP CUSTOM (KITS) ×2 IMPLANT
PENCIL SMOKE EVACUATOR (MISCELLANEOUS) IMPLANT
SET HNDPC FAN SPRY TIP SCT (DISPOSABLE) ×1 IMPLANT
STAPLER VISISTAT 35W (STAPLE) IMPLANT
STRIP CLOSURE SKIN 1/2X4 (GAUZE/BANDAGES/DRESSINGS) IMPLANT
SUT ETHIBOND NAB CT1 #1 30IN (SUTURE) ×2 IMPLANT
SUT ETHILON 2 0 PS N (SUTURE) IMPLANT
SUT MNCRL AB 4-0 PS2 18 (SUTURE) IMPLANT
SUT VIC AB 0 CT1 36 (SUTURE) ×2 IMPLANT
SUT VIC AB 1 CT1 36 (SUTURE) ×2 IMPLANT
SUT VIC AB 2-0 CT1 27 (SUTURE) ×4
SUT VIC AB 2-0 CT1 TAPERPNT 27 (SUTURE) ×2 IMPLANT
TRAY FOLEY MTR SLVR 16FR STAT (SET/KITS/TRAYS/PACK) IMPLANT

## 2020-04-12 NOTE — Telephone Encounter (Signed)
LMOM for patient letting her know I will have Ninfa Linden sign this for her and have it up at the front desk for her tomorrow morning

## 2020-04-12 NOTE — Anesthesia Preprocedure Evaluation (Signed)
Anesthesia Evaluation  Patient identified by MRN, date of birth, ID band Patient awake    Reviewed: Allergy & Precautions, NPO status , Patient's Chart, lab work & pertinent test results  Airway Mallampati: II  TM Distance: >3 FB Neck ROM: Full    Dental  (+) Teeth Intact   Pulmonary asthma ,    Pulmonary exam normal        Cardiovascular negative cardio ROS   Rhythm:Regular Rate:Normal     Neuro/Psych  Headaches, Anxiety Depression    GI/Hepatic Neg liver ROS, GERD  Controlled,diverticulosis   Endo/Other  Pre-diabetic  Renal/GU negative Renal ROS  negative genitourinary   Musculoskeletal  (+) Arthritis , Osteoarthritis,  Fibromyalgia -  Abdominal (+)  Abdomen: soft. Bowel sounds: normal.  Peds  Hematology negative hematology ROS (+)   Anesthesia Other Findings   Reproductive/Obstetrics                             Anesthesia Physical Anesthesia Plan  ASA: II  Anesthesia Plan: MAC and Spinal   Post-op Pain Management:    Induction:   PONV Risk Score and Plan: 2 and Ondansetron, Dexamethasone, Treatment may vary due to age or medical condition and Propofol infusion  Airway Management Planned: Simple Face Mask, Natural Airway and Nasal Cannula  Additional Equipment: None  Intra-op Plan:   Post-operative Plan:   Informed Consent: I have reviewed the patients History and Physical, chart, labs and discussed the procedure including the risks, benefits and alternatives for the proposed anesthesia with the patient or authorized representative who has indicated his/her understanding and acceptance.     Dental advisory given  Plan Discussed with: CRNA  Anesthesia Plan Comments: (Lab Results      Component                Value               Date                      WBC                      5.8                 02/26/2020                HGB                      13.1                 02/26/2020                HCT                      40.1                02/26/2020                MCV                      96                  02/26/2020                PLT  310                 02/26/2020           Covid-19 Nucleic Acid Test Results Lab Results      Component                Value               Date                      SARSCOV2NAA              NEGATIVE            04/09/2020                SARSCOV2NAA              Not Detected        06/01/2019              )        Anesthesia Quick Evaluation

## 2020-04-12 NOTE — Anesthesia Postprocedure Evaluation (Signed)
Anesthesia Post Note  Patient: Ardeth Repetto [facility]  Procedure(s) Performed: LEFT TOTAL HIP ARTHROPLASTY ANTERIOR APPROACH (Left Hip)     Patient location during evaluation: PACU Anesthesia Type: MAC and Spinal Level of consciousness: oriented and awake and alert Pain management: pain level controlled Vital Signs Assessment: post-procedure vital signs reviewed and stable Respiratory status: spontaneous breathing, respiratory function stable and patient connected to nasal cannula oxygen Cardiovascular status: blood pressure returned to baseline and stable Postop Assessment: no headache, no backache and no apparent nausea or vomiting Anesthetic complications: no   No complications documented.  Last Vitals:  Vitals:   04/12/20 1638 04/12/20 1747  BP: 129/67 131/69  Pulse: 78 81  Resp: 16 18  Temp: (!) 36.4 C 36.9 C  SpO2: 100% 100%    Last Pain:  Vitals:   04/12/20 1814  TempSrc:   PainSc: 5                  Gerri Acre P Kaprice Kage

## 2020-04-12 NOTE — Anesthesia Procedure Notes (Signed)
Spinal  Patient location during procedure: OR End time: 04/12/2020 1:27 PM Staffing Performed: anesthesiologist  Resident/CRNA: Noralyn Pick D, CRNA Preanesthetic Checklist Completed: patient identified, IV checked, site marked, risks and benefits discussed, surgical consent, monitors and equipment checked, pre-op evaluation and timeout performed Spinal Block Patient position: sitting Prep: Betadine Patient monitoring: heart rate, continuous pulse ox and blood pressure Approach: midline Location: L3-4 Injection technique: single-shot Needle Needle type: Sprotte  Needle gauge: 24 G Needle length: 9 cm Assessment Sensory level: T6 Additional Notes Expiration date of kit checked and confirmed. Patient tolerated procedure well, without complications.

## 2020-04-12 NOTE — Plan of Care (Signed)
Discussed with patient and husband about plan of care for post-op day 0. ° ° °Will continue to monitor patient.  ° ° °SWhittemore, RN ° °

## 2020-04-12 NOTE — Transfer of Care (Signed)
Immediate Anesthesia Transfer of Care Note  Patient: Bridget Joyce [facility]  Procedure(s) Performed: LEFT TOTAL HIP ARTHROPLASTY ANTERIOR APPROACH (Left Hip)  Patient Location: PACU  Anesthesia Type:Spinal  Level of Consciousness: awake, alert  and oriented  Airway & Oxygen Therapy: Patient Spontanous Breathing and Patient connected to face mask oxygen  Post-op Assessment: Report given to RN and Post -op Vital signs reviewed and stable  Post vital signs: Reviewed and stable  Last Vitals:  Vitals Value Taken Time  BP 108/60 04/12/20 1508  Temp    Pulse 97 04/12/20 1511  Resp 20 04/12/20 1511  SpO2 100 % 04/12/20 1511  Vitals shown include unvalidated device data.  Last Pain:  Vitals:   04/12/20 1101  TempSrc: Oral  PainSc:       Patients Stated Pain Goal: 4 (94/80/16 5537)  Complications: No complications documented.

## 2020-04-12 NOTE — Brief Op Note (Signed)
04/12/2020  2:51 PM  PATIENT:  Bridget Joyce  64 y.o. female  PRE-OPERATIVE DIAGNOSIS:  osteoarthritis left hip  POST-OPERATIVE DIAGNOSIS:  osteoarthritis left hip  PROCEDURE:  Procedure(s): LEFT TOTAL HIP ARTHROPLASTY ANTERIOR APPROACH (Left)  SURGEON:  Surgeon(s) and Role:    Mcarthur Rossetti, MD - Primary  PHYSICIAN ASSISTANT:  Benita Stabile, PA-C  ANESTHESIA:   spinal  EBL:  250 mL   COUNTS:  YES  DICTATION: .Other Dictation: Dictation Number [id number]  PLAN OF CARE: Admit for overnight observation  PATIENT DISPOSITION:  PACU - hemodynamically stable.   Delay start of Pharmacological VTE agent (>24hrs) due to surgical blood loss or risk of bleeding: no

## 2020-04-12 NOTE — H&P (Signed)
TOTAL HIP ADMISSION H&P  Patient is admitted for left total hip arthroplasty.  Subjective:  Chief Complaint: left hip pain  HPI: Bridget Joyce, 64 y.o. female, has a history of pain and functional disability in the left hip(s) due to arthritis and patient has failed non-surgical conservative treatments for greater than 12 weeks to include NSAID's and/or analgesics, flexibility and strengthening excercises, weight reduction as appropriate and activity modification.  Onset of symptoms was abrupt starting 1 years ago with rapidlly worsening course since that time.The patient noted no past surgery on the left hip(s).  Patient currently rates pain in the left hip at 10 out of 10 with activity. Patient has night pain, worsening of pain with activity and weight bearing, trendelenberg gait, pain that interfers with activities of daily living and pain with passive range of motion. Patient has evidence of subchondral cysts, subchondral sclerosis, periarticular osteophytes and joint space narrowing by imaging studies. This condition presents safety issues increasing the risk of falls.  There is no current active infection.  Patient Active Problem List   Diagnosis Date Noted  . Unilateral primary osteoarthritis, left hip 02/15/2020  . Arthritis of left hip 02/04/2020  . Anxiety 10/29/2019  . Fibromyalgia 10/29/2019  . GAD (generalized anxiety disorder) 04/14/2018  . Dysthymic disorder 04/14/2018  . Primary osteoarthritis of right knee 01/25/2017  . Mixed hyperlipidemia 07/13/2014  . Mild persistent asthma   12/19/2013  . Prediabetes   . Vitamin D deficiency   . Myopia 10/28/2012  . Morbid obesity (BMI 37.11) 06/10/2009  . GERD 05/26/2008  . Allergic rhinitis 05/20/2008  . Exercise-induced bronchospasm 05/20/2008  . Diverticulosis 09/02/2006   Past Medical History:  Diagnosis Date  . Allergy    rag weed, pollen,dust  . Ankle fracture 12/2017   Left   . Anxiety    mild; infrequent per  patient  . Asthma   . Bursitis of hip   . Diverticulosis   . External hemorrhoids   . Fibroid   . Fibromyalgia   . GERD (gastroesophageal reflux disease)   . Migraine    Ocular  . Obesity   . Peripheral edema    infrequent per patient  . Pre-diabetes   . Vitamin D deficiency     Past Surgical History:  Procedure Laterality Date  . ABDOMINAL HYSTERECTOMY  2002   ovaries remain  . BLEPHAROPLASTY Bilateral   . COLONOSCOPY  09/02/2006   external hemorrhoids,diverticulosis  . EYE SURGERY Left   . KNEE SURGERY     Left  . LIPOMA EXCISION     Labial  . MANDIBLE SURGERY    . MOUTH SURGERY    . REPAIR ANKLE LIGAMENT Right 04/05/2014   x2  . TONSILLECTOMY      Current Facility-Administered Medications  Medication Dose Route Frequency Provider Last Rate Last Admin  . ceFAZolin (ANCEF) IVPB 2g/100 mL premix  2 g Intravenous On Call to OR Pete Pelt, PA-C      . lactated ringers infusion   Intravenous Continuous Stoltzfus, March Rummage, DO 10 mL/hr at 04/12/20 1118 Continued from Pre-op at 04/12/20 1118  . tranexamic acid (CYKLOKAPRON) IVPB 1,000 mg  1,000 mg Intravenous To OR Pete Pelt, PA-C       Allergies  Allergen Reactions  . [place] Grass Extract Itching  . Grass Pollen(K-O-R-T-Swt Vern)   . Mold Extract  [Trichophyton] Itching  . Molds & Smuts   . Other Other (See Comments)    Dust mite/ unknown  .  Short Ragweed Pollen Ext     Social History   Tobacco Use  . Smoking status: Never Smoker  . Smokeless tobacco: Never Used  Substance Use Topics  . Alcohol use: No    Family History  Problem Relation Age of Onset  . Hypertension Father   . Heart disease Father   . Diabetes Father   . Heart failure Father   . Uterine cancer Sister   . Diabetes Sister   . Stroke Mother   . Colon cancer Neg Hx   . Esophageal cancer Neg Hx   . Stomach cancer Neg Hx   . Rectal cancer Neg Hx      Review of Systems  Musculoskeletal: Positive for back pain and gait  problem.  All other systems reviewed and are negative.   Objective:  Physical Exam Vitals reviewed.  Constitutional:      Appearance: Normal appearance.  HENT:     Head: Normocephalic and atraumatic.  Eyes:     Extraocular Movements: Extraocular movements intact.     Pupils: Pupils are equal, round, and reactive to light.  Cardiovascular:     Rate and Rhythm: Normal rate and regular rhythm.     Pulses: Normal pulses.  Pulmonary:     Effort: Pulmonary effort is normal.     Breath sounds: Normal breath sounds.  Abdominal:     Palpations: Abdomen is soft.  Musculoskeletal:     Cervical back: Normal range of motion.     Left hip: Tenderness and bony tenderness present. Decreased range of motion. Decreased strength.  Neurological:     Mental Status: She is alert and oriented to person, place, and time.  Psychiatric:        Behavior: Behavior normal.     Vital signs in last 24 hours: Temp:  [98.7 F (37.1 C)] 98.7 F (37.1 C) (12/14 1101) Pulse Rate:  [82] 82 (12/14 1101) Resp:  [18] 18 (12/14 1101) BP: (140-152)/(86-89) 140/89 (12/14 1133) SpO2:  [100 %] 100 % (12/14 1101) Weight:  [101.5 kg-102.5 kg] 102.5 kg (12/14 1101)  Labs:   Estimated body mass index is 37.61 kg/m as calculated from the following:   Height as of this encounter: 5\' 5"  (1.651 m).   Weight as of this encounter: 102.5 kg.   Imaging Review Plain radiographs demonstrate moderate degenerative joint disease of the left hip(s). The bone quality appears to be excellent for age and reported activity level.      Assessment/Plan:  End stage arthritis, left hip(s)  The patient history, physical examination, clinical judgement of the provider and imaging studies are consistent with end stage degenerative joint disease of the left hip(s) and total hip arthroplasty is deemed medically necessary. The treatment options including medical management, injection therapy, arthroscopy and arthroplasty were  discussed at length. The risks and benefits of total hip arthroplasty were presented and reviewed. The risks due to aseptic loosening, infection, stiffness, dislocation/subluxation,  thromboembolic complications and other imponderables were discussed.  The patient acknowledged the explanation, agreed to proceed with the plan and consent was signed. Patient is being admitted for inpatient treatment for surgery, pain control, PT, OT, prophylactic antibiotics, VTE prophylaxis, progressive ambulation and ADL's and discharge planning.The patient is planning to be discharged home with home health services

## 2020-04-12 NOTE — Op Note (Signed)
NAME: Bridget Joyce, Bridget Joyce MEDICAL RECORD [id number] ACCOUNT [id number] DATE OF BIRTH:1956/01/03 FACILITY: [facility] LOCATION: [facility] PHYSICIAN:Orit Sanville Kerry Fort, MD  OPERATIVE REPORT  DATE OF PROCEDURE:  04/12/2020  PREOPERATIVE DIAGNOSES:  Primary osteoarthritis and degenerative joint disease, left hip.  POSTOPERATIVE DIAGNOSES:  Primary osteoarthritis and degenerative joint disease, left hip.  PROCEDURE:  Left total hip arthroplasty through direct anterior approach.  IMPLANTS:  DePuy Sector Gription acetabular component size 50, size 32+0 neutral polyethylene liner, size 11 Corail femoral component with high offset, size 32+1 metal hip ball.  SURGEON:  Lind Guest. Ninfa Linden, MD  ASSISTANT:  Erskine Emery, PA-C  ANESTHESIA:  Spinal.  ANTIBIOTICS:  Two grams IV Ancef.  ESTIMATED BLOOD LOSS:  200 to 300 mL.  COMPLICATIONS:  None.  INDICATIONS:  The patient is a very active 64 year old female with debilitating left hip pain that really only flared up for the last 6 months or less.  An MRI of her left hip did show severe end-stage arthritis of her left hip with joint space narrowing  and subchondral edema in the femoral head and acetabulum.  She does have some mild arthritis in her right hip.  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  At this point, her left hip pain  has become debilitating and is detrimentally affecting her mobility, her quality of life and her activities of daily living to the point she does wish to proceed with a total hip arthroplasty.  I described in detail what this surgery involves, including  describing the risk of acute blood loss anemia, nerve and vessel injury, fracture, infection, dislocation, DVT, implant failure and skin and soft tissue issues.  We talked about our goals being decrease pain, improve mobility and overall improve quality  of life.  DESCRIPTION OF PROCEDURE:  After  informed consent was obtained and appropriate left hip was marked, she was brought to the operating room and sat up on a stretcher where spinal anesthesia was obtained.  She was then laid in the supine position on a  stretcher.  Foley catheter was placed.  I assessed her leg lengths again and found her left operative side actually slightly longer than the right.  She understands that we will not shorten her for stability purposes.  Traction boots were placed on both  her feet.  Next, she was placed supine on the Hana fracture table, the perineal post in place and both legs in line skeletal traction device and no traction applied.  We did obtain preoperative x-rays for getting a good idea of where she sits to see if  we can make her the exact same.  Her left hip was prepped and draped with DuraPrep and sterile drapes.  Timeout was called and she was identified as correct patient, correct left hip.  We then made an incision just inferior and posterior to the anterior  superior iliac spine and carried this obliquely down the leg.  We dissected down tensor fascia lata muscle.  Tensor fascia was then divided longitudinally to proceed with direct anterior approach to the hip, identified and cauterized circumflex vessels  and identified the hip capsule.  We opened the hip capsule in an L-type format, finding a moderate joint effusion and some lateral periarticular osteophytes.  We placed Cobra retractors within the joint capsule around the medial and lateral femoral neck  and made our femoral neck cut with an oscillating saw proximal to the lesser trochanter and used an osteotome  to complete this cut.  I then placed a corkscrew guide in the femoral head and removed the femoral head in its entirety.  I did find a wide area  devoid of cartilage.  I then placed a bent Hohmann over the medial acetabular rim and removed remnants of the acetabular labrum and other debris from the acetabulum and then began reaming under  direct visualization from a size 43 reamer in stepwise  increments, going up to a size 49 with all reamers placed under direct visualization, the last reamer was also placed under direct fluoroscopy, so I could obtain my depth of reaming by inclination and anteversion.  I then placed the real DePuy Sector  Gription acetabular component size 50 and we went with a 32+0  neutral polyethylene liner.  Attention was then turned to the femur.  With the leg externally rotated to 120 degrees, extended and adducted, we were able to place a Mueller retractor medially  and Hohmann retractor behind the greater trochanter.  We released lateral joint capsule and used a box-cutting osteotome to enter the femoral canal and a rongeur to lateralize, then began broaching using the Corail broaching system from a size 8 going  up to a size 11.  With a size 11 in place, we liked the fill and fit of that, so we trialed a standard offset femoral neck and a 32+1 hip ball.  We reduced this in the acetabulum very easily and really looking at x-rays, maybe have lengthened her a  little bit, but I did not like the assessment of stability.  I felt like she needed more offset.  We dislocated the hip and removed the trial components.  We placed the real Corail femoral component size 11 but with high offset and went with the real  32+1 metal hip ball and again reduced this in the acetabulum and it was stable.  We then irrigated the soft tissue with normal saline solution using pulsatile lavage.  I closed the joint capsule with interrupted #1 Ethibond suture, followed by closing  the tensor fascia with #1 Vicryl.  0 Vicryl was used to close the deep tissue, 2-0 Vicryl was used to close subcutaneous tissue and interrupted staples were used to reapproximate the skin.  Aquacel dressing was applied.  She was taken off the Hana table  and taken to recovery room in stable condition, with all final counts being correct.  No complications noted.  Of  note, Benita Stabile, PA-C, did assist during the entire case and assistance was crucial for facilitating all aspects of this case.  HN/NUANCE  D:04/12/2020 T:04/12/2020 JOB:[id number]

## 2020-04-13 ENCOUNTER — Telehealth: Payer: Self-pay | Admitting: Orthopaedic Surgery

## 2020-04-13 DIAGNOSIS — M1612 Unilateral primary osteoarthritis, left hip: Secondary | ICD-10-CM | POA: Diagnosis present

## 2020-04-13 DIAGNOSIS — Z20822 Contact with and (suspected) exposure to covid-19: Secondary | ICD-10-CM | POA: Diagnosis present

## 2020-04-13 DIAGNOSIS — Z90710 Acquired absence of both cervix and uterus: Secondary | ICD-10-CM | POA: Diagnosis not present

## 2020-04-13 DIAGNOSIS — Z833 Family history of diabetes mellitus: Secondary | ICD-10-CM | POA: Diagnosis not present

## 2020-04-13 DIAGNOSIS — K219 Gastro-esophageal reflux disease without esophagitis: Secondary | ICD-10-CM | POA: Diagnosis present

## 2020-04-13 DIAGNOSIS — Z823 Family history of stroke: Secondary | ICD-10-CM | POA: Diagnosis not present

## 2020-04-13 DIAGNOSIS — R7303 Prediabetes: Secondary | ICD-10-CM | POA: Diagnosis present

## 2020-04-13 DIAGNOSIS — M2170 Unequal limb length (acquired), unspecified site: Secondary | ICD-10-CM | POA: Diagnosis present

## 2020-04-13 DIAGNOSIS — Z8249 Family history of ischemic heart disease and other diseases of the circulatory system: Secondary | ICD-10-CM | POA: Diagnosis not present

## 2020-04-13 DIAGNOSIS — M797 Fibromyalgia: Secondary | ICD-10-CM | POA: Diagnosis present

## 2020-04-13 DIAGNOSIS — J4530 Mild persistent asthma, uncomplicated: Secondary | ICD-10-CM | POA: Diagnosis present

## 2020-04-13 DIAGNOSIS — Z8049 Family history of malignant neoplasm of other genital organs: Secondary | ICD-10-CM | POA: Diagnosis not present

## 2020-04-13 LAB — CBC
HCT: 33.7 % — ABNORMAL LOW (ref 36.0–46.0)
Hemoglobin: 11.1 g/dL — ABNORMAL LOW (ref 12.0–15.0)
MCH: 31.2 pg (ref 26.0–34.0)
MCHC: 32.9 g/dL (ref 30.0–36.0)
MCV: 94.7 fL (ref 80.0–100.0)
Platelets: 285 10*3/uL (ref 150–400)
RBC: 3.56 MIL/uL — ABNORMAL LOW (ref 3.87–5.11)
RDW: 12.3 % (ref 11.5–15.5)
WBC: 10.8 10*3/uL — ABNORMAL HIGH (ref 4.0–10.5)
nRBC: 0 % (ref 0.0–0.2)

## 2020-04-13 LAB — BASIC METABOLIC PANEL
Anion gap: 7 (ref 5–15)
BUN: 16 mg/dL (ref 8–23)
CO2: 27 mmol/L (ref 22–32)
Calcium: 8.9 mg/dL (ref 8.9–10.3)
Chloride: 106 mmol/L (ref 98–111)
Creatinine, Ser: 0.8 mg/dL (ref 0.44–1.00)
GFR, Estimated: >60 mL/min (ref >60)
Glucose, Bld: 159 mg/dL — ABNORMAL HIGH (ref 70–99)
Potassium: 4.5 mmol/L (ref 3.5–5.1)
Sodium: 140 mmol/L (ref 135–145)

## 2020-04-13 NOTE — Progress Notes (Signed)
Subjective: 1 Day Post-Op Procedure(s) (LRB): LEFT TOTAL HIP ARTHROPLASTY ANTERIOR APPROACH (Left) Patient reports pain as moderate.    Objective: Vital signs in last 24 hours: Temp:  [97.4 F (36.3 C)-98.7 F (37.1 C)] 97.7 F (36.5 C) (12/15 0451) Pulse Rate:  [73-96] 73 (12/15 0451) Resp:  [11-18] 17 (12/15 0451) BP: (104-152)/(60-89) 127/76 (12/15 0451) SpO2:  [97 %-100 %] 100 % (12/15 0451) Weight:  [101.5 kg-102.5 kg] 102.5 kg (12/14 1101)  Intake/Output from previous day: 12/14 0701 - 12/15 0700 In: 2006.2 [P.O.:410; I.V.:1546.2; IV Piggyback:50] Out: 3400 [Urine:3150; Blood:250] Intake/Output this shift: No intake/output data recorded.  Recent Labs    04/12/20 1054 04/13/20 0302  HGB 12.7 11.1*   Recent Labs    04/12/20 1054 04/13/20 0302  WBC 7.3 10.8*  RBC 4.08 3.56*  HCT 39.0 33.7*  PLT 291 285   Recent Labs    04/12/20 1054 04/13/20 0302  NA 141 140  K 3.5 4.5  CL 103 106  CO2 28 27  BUN 15 16  CREATININE 0.91 0.80  GLUCOSE 103* 159*  CALCIUM 9.5 8.9   No results for input(s): LABPT, INR in the last 72 hours.  Sensation intact distally Intact pulses distally Dorsiflexion/Plantar flexion intact Incision: scant drainage   Assessment/Plan: 1 Day Post-Op Procedure(s) (LRB): LEFT TOTAL HIP ARTHROPLASTY ANTERIOR APPROACH (Left) Plan for discharge tomorrow Discharge home with home health  Patient lives alone, so needs to stay today in order to maximize therapy.    Mcarthur Rossetti 04/13/2020, 7:40 AM

## 2020-04-13 NOTE — Progress Notes (Signed)
Physical Therapy Treatment Patient Details Name: Bridget Joyce MRN: [id number] DOB: [birth year] Today's Date: 04/13/2020    History of Present Illness Pt is a 64 year old female s/p L THA direct anterior approach    PT Comments    Pt standing in bathroom with nurse tech on arrival.  Pt ambulated in hallway 240 feet with min/guard for safety. Pt requires increased time for all mobility due to pain and positioning L LE.  Pt requiring mod assist for bed mobility due to pain.  Pt states any movement of hip abduction, adduction or flexion causes pain and requires assistance.  Pt uncertain of d/c and states now that her family/friends all work during the day.  Pt appears open to SNF however requested she discuss with Dr. Ninfa Linden tomorrow.   Follow Up Recommendations  Home health PT;Supervision for mobility/OOB (may need SNF if no assist at home)     Equipment Recommendations  Rolling walker with 5" wheels    Recommendations for Other Services       Precautions / Restrictions Precautions Precautions: Fall Restrictions LLE Weight Bearing: Weight bearing as tolerated    Mobility  Bed Mobility Overal bed mobility: Needs Assistance Bed Mobility: Sit to Supine       Sit to supine: Mod assist   General bed mobility comments: required bil LEs onto bed upon return to supine  Transfers Overall transfer level: Needs assistance Equipment used: Rolling walker (2 wheeled) Transfers: Sit to/from Stand Sit to Stand: Min assist         General transfer comment: assist to control descent  Ambulation/Gait Ambulation/Gait assistance: Min guard Gait Distance (Feet): 240 Feet Assistive device: Rolling walker (2 wheeled) Gait Pattern/deviations: Step-to pattern;Antalgic     General Gait Details: observed mild left hip circumduction during swing, cues for sequence, RW positioning, posture, step length; pt again concerned about LLD   Stairs             Wheelchair Mobility     Modified Rankin (Stroke Patients Only)       Balance                                            Cognition Arousal/Alertness: Awake/alert Behavior During Therapy: WFL for tasks assessed/performed Overall Cognitive Status: Within Functional Limits for tasks assessed                                        Exercises      General Comments        Pertinent Vitals/Pain Pain Assessment: 0-10 Pain Score: 6  Pain Location: left hip with movement Pain Descriptors / Indicators: Sore;Sharp Pain Intervention(s): Premedicated before session;Repositioned;Monitored during session    Home Living                      Prior Function            PT Goals (current goals can now be found in the care plan section) Acute Rehab PT Goals PT Goal Formulation: With patient Time For Goal Achievement: 04/20/20 Potential to Achieve Goals: Good Progress towards PT goals: Progressing toward goals    Frequency    7X/week      PT Plan Current plan remains appropriate    Co-evaluation  AM-PAC PT "6 Clicks" Mobility   Outcome Measure  Help needed turning from your back to your side while in a flat bed without using bedrails?: A Little Help needed moving from lying on your back to sitting on the side of a flat bed without using bedrails?: A Lot Help needed moving to and from a bed to a chair (including a wheelchair)?: A Little Help needed standing up from a chair using your arms (e.g., wheelchair or bedside chair)?: A Little Help needed to walk in hospital room?: A Little Help needed climbing 3-5 steps with a railing? : A Lot 6 Click Score: 16    End of Session Equipment Utilized During Treatment: Gait belt Activity Tolerance: Patient tolerated treatment well Patient left: with call bell/phone within reach;in bed;with bed alarm set   PT Visit Diagnosis: Other abnormalities of gait and mobility (R26.89)     Time:  7867-6720 PT Time Calculation (min) (ACUTE ONLY): 25 min  Charges:  $Gait Training: 23-37 mins                     Jannette Spanner PT, DPT Acute Rehabilitation Services Pager: [phone] Office: [phone]  Trena Platt 04/13/2020, 4:38 PM

## 2020-04-13 NOTE — Telephone Encounter (Signed)
Patient's medical record request sent to [facility] 04/13/20

## 2020-04-13 NOTE — Evaluation (Signed)
Physical Therapy Evaluation Patient Details Name: Bridget Joyce MRN: [id number] DOB: 1955-12-30 Today's Date: 04/13/2020   History of Present Illness  Pt is a 64 year old female s/p L THA direct anterior approach  Clinical Impression  Pt is s/p THA resulting in the deficits listed below (see PT Problem List).  Pt will benefit from skilled PT to increase their independence and safety with mobility to allow discharge to the venue listed below.   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  Pt with pain and difficulty with active hip movement requiring assist for bed mobility and standing today however pt able to ambulate good distance in hallway.  Pt lives alone and plans to return home.  Pt states she could have people assist if she really needs them however she states she does not like to ask for help.  Will continue to work with pt in acute setting in preparation for d/c home.     Follow Up Recommendations Home health PT;Supervision for mobility/OOB    Equipment Recommendations  Rolling walker with 5" wheels    Recommendations for Other Services       Precautions / Restrictions Precautions Precautions: Fall Restrictions LLE Weight Bearing: Weight bearing as tolerated      Mobility  Bed Mobility Overal bed mobility: Needs Assistance Bed Mobility: Supine to Sit;Sit to Supine     Supine to sit: Mod assist Sit to supine: Mod assist   General bed mobility comments: verbla cues for self assist however pt required L LE assist and trunk upright upon getting to EOB; required bil LEs onto bed upon return to supine    Transfers Overall transfer level: Needs assistance Equipment used: Rolling walker (2 wheeled) Transfers: Sit to/from Stand Sit to Stand: Min assist;From elevated surface         General transfer comment: verbal cues for safe technique, assist to  rise, steady, and control descent  Ambulation/Gait Ambulation/Gait assistance: Min assist Gait Distance (Feet): 200 Feet Assistive device: Rolling walker (2 wheeled) Gait Pattern/deviations: Step-to pattern;Antalgic     General Gait Details: observed mild left hip circumduction during swing, cues for sequence, RW positioning, posture, step length, assist initially however improved with distance; pt reports LLD  Stairs            Wheelchair Mobility    Modified Rankin (Stroke Patients Only)       Balance                                             Pertinent Vitals/Pain Pain Assessment: 0-10 Pain Score: 8  Pain Location: left hip with movement Pain Intervention(s): Repositioned;Monitored during session;Ice applied    Home Living Family/patient expects to be discharged to:: Private residence Living Arrangements: Alone   Type of Home: House Home Access: Other (comment) (threshold)     Home Layout: One level Home Equipment: None      Prior Function Level of Independence: Independent               Hand Dominance        Extremity/Trunk Assessment        Lower Extremity Assessment Lower Extremity Assessment: LLE deficits/detail LLE Deficits / Details: pt reports from her description an overstretch type injury to  left hip in August, states MRI was negative however she had great difficulty with pain and hip strength for months thereafter; pt also concerned with possible LLD prior to surgery (left longer then right LE) and feels this is still the case post op.  pt not able to perform active hip abduction, adduction or flexion due to pain and required assist for bed mobility LLE: Unable to fully assess due to pain       Communication   Communication: No difficulties  Cognition Arousal/Alertness: Awake/alert Behavior During Therapy: WFL for tasks assessed/performed Overall Cognitive Status: Within Functional Limits for tasks assessed                                         General Comments      Exercises     Assessment/Plan    PT Assessment Patient needs continued PT services  PT Problem List Decreased strength;Decreased mobility;Decreased balance;Decreased knowledge of use of DME;Pain;Decreased activity tolerance       PT Treatment Interventions DME instruction;Therapeutic exercise;Gait training;Balance training;Functional mobility training;Patient/family education;Therapeutic activities;Stair training    PT Goals (Current goals can be found in the Care Plan section)  Acute Rehab PT Goals PT Goal Formulation: With patient Time For Goal Achievement: 04/20/20 Potential to Achieve Goals: Good    Frequency 7X/week   Barriers to discharge        Co-evaluation               AM-PAC PT "6 Clicks" Mobility  Outcome Measure Help needed turning from your back to your side while in a flat bed without using bedrails?: A Little Help needed moving from lying on your back to sitting on the side of a flat bed without using bedrails?: A Little Help needed moving to and from a bed to a chair (including a wheelchair)?: A Little Help needed standing up from a chair using your arms (e.g., wheelchair or bedside chair)?: A Little Help needed to walk in hospital room?: A Little Help needed climbing 3-5 steps with a railing? : A Lot 6 Click Score: 17    End of Session Equipment Utilized During Treatment: Gait belt Activity Tolerance: Patient tolerated treatment well Patient left: with call bell/phone within reach;in bed;with bed alarm set   PT Visit Diagnosis: Other abnormalities of gait and mobility (R26.89)    Time: 8270-7867 PT Time Calculation (min) (ACUTE ONLY): 37 min   Charges:   PT Evaluation $PT Eval Low Complexity: 1 Low PT Treatments $Gait Training: 8-22 mins   Arlyce Dice, DPT Acute Rehabilitation Services Pager: [phone] Office: [phone] York Ram E 04/13/2020,  12:46 PM

## 2020-04-13 NOTE — Discharge Instructions (Signed)

## 2020-04-13 NOTE — Plan of Care (Signed)

## 2020-04-13 NOTE — TOC Transition Note (Signed)
Transition of Care [facility]) - CM/SW Discharge Note   Patient Details  Name: Bridget Joyce MRN: [id number] Date of Birth: [birth year]  Transition of Care [facility]) CM/SW Contact:  Lennart Pall, LCSW Phone Number: 04/13/2020, 10:27 AM   Clinical Narrative:    Met briefly with pt and confirming need for rw and 3n1 - [facility].  Plan for HHPT via [place].  No further TOC needs.   Final next level of care: [place] Barriers to Discharge: Continued Medical Work up   Patient Goals and CMS Choice Patient states their goals for this hospitalization and ongoing recovery are:: return home      Discharge Placement                       Discharge Plan and Services                DME Arranged: 3-N-1,Walker rolling DME Agency: [facility] Date DME Agency Contacted: 04/13/20 Time DME Agency Contacted: 51 Representative spoke with at DME Agency: Ovid Curd HH Arranged: PT [place]: [facility] (arranged via MD office prior to surgery)        Social Determinants of Health (SDOH) Interventions     Readmission Risk Interventions Readmission Risk Prevention Plan 04/13/2020  Post Dischage Appt Complete  Medication Screening Complete  Transportation Screening Complete  Some recent data might be hidden

## 2020-04-14 NOTE — Progress Notes (Signed)
Subjective: 2 Days Post-Op Procedure(s) (LRB): LEFT TOTAL HIP ARTHROPLASTY ANTERIOR APPROACH (Left) Patient reports pain as moderate.  She is struggling with her mobility.  Does live alone without much assistance.  Requesting short-term skilled nursing.  Objective: Vital signs in last 24 hours: Temp:  [97.7 F (36.5 C)-99.2 F (37.3 C)] 98.4 F (36.9 C) (12/16 0629) Pulse Rate:  [86-98] 89 (12/16 0629) Resp:  [16-20] 16 (12/16 0629) BP: (126-147)/(64-80) 147/74 (12/16 0629) SpO2:  [96 %-100 %] 96 % (12/16 0629)  Intake/Output from previous day: 12/15 0701 - 12/16 0700 In: 540 [P.O.:540] Out: 3900 [Urine:3900] Intake/Output this shift: No intake/output data recorded.  Recent Labs    04/12/20 1054 04/13/20 0302  HGB 12.7 11.1*   Recent Labs    04/12/20 1054 04/13/20 0302  WBC 7.3 10.8*  RBC 4.08 3.56*  HCT 39.0 33.7*  PLT 291 285   Recent Labs    04/12/20 1054 04/13/20 0302  NA 141 140  K 3.5 4.5  CL 103 106  CO2 28 27  BUN 15 16  CREATININE 0.91 0.80  GLUCOSE 103* 159*  CALCIUM 9.5 8.9   No results for input(s): LABPT, INR in the last 72 hours.  Sensation intact distally Intact pulses distally Dorsiflexion/Plantar flexion intact Incision: dressing C/D/I   Assessment/Plan: 2 Days Post-Op Procedure(s) (LRB): LEFT TOTAL HIP ARTHROPLASTY ANTERIOR APPROACH (Left) Up with therapy Discharge to SNF  Next 1-2 days hopefully Consult Transitional Care Team.     Mcarthur Rossetti 04/14/2020, 9:45 AM

## 2020-04-14 NOTE — Progress Notes (Signed)
Physical Therapy Treatment Patient Details Name: Bridget Joyce MRN: [id number] DOB: 1955/09/24 Today's Date: 04/14/2020    History of Present Illness Pt is a 64 year old female s/p L THA direct anterior approach    PT Comments    Pt requiring mod assist for bed mobility.  Pt continues to report sharp increased pain with any hip movement other then a neutral position (so active hip flexion, abduction, adduction) however was able to ambulate in hallway with compensatory strategies (see below).  Pt would benefit from d/c to SNF as she will not have assist at home.    Follow Up Recommendations  SNF     Equipment Recommendations  Rolling walker with 5" wheels    Recommendations for Other Services       Precautions / Restrictions Precautions Precautions: Fall Restrictions LLE Weight Bearing: Weight bearing as tolerated    Mobility  Bed Mobility Overal bed mobility: Needs Assistance Bed Mobility: Supine to Sit;Sit to Supine     Supine to sit: Mod assist Sit to supine: Mod assist   General bed mobility comments: pt relies on LEs moving together and required assist for L LE and twisting bed pad to scoot to EOB, assist required for bil LEs onto bed upon return to supine  Transfers Overall transfer level: Needs assistance Equipment used: Rolling walker (2 wheeled) Transfers: Sit to/from Stand Sit to Stand: Min assist         General transfer comment: slight assist to rise and steady, improved controlled descent today  Ambulation/Gait Ambulation/Gait assistance: Min guard Gait Distance (Feet): 200 Feet Assistive device: Rolling walker (2 wheeled) Gait Pattern/deviations: Step-to pattern;Antalgic     General Gait Details: observed R LE increased time on forefoot to lengthen R LE for L Leg to swing to, pt tends to use momentum and forward lean to advance L LE as well; pt performs step to with R LE leading due to difficulty with hip flexion and advancing L LE due to hip  weakness; pt again concerned about LLD   Stairs             Wheelchair Mobility    Modified Rankin (Stroke Patients Only)       Balance                                            Cognition Arousal/Alertness: Awake/alert Behavior During Therapy: WFL for tasks assessed/performed Overall Cognitive Status: Within Functional Limits for tasks assessed                                        Exercises      General Comments        Pertinent Vitals/Pain Pain Assessment: 0-10 Pain Score: 6  Pain Location: left hip with movement Pain Descriptors / Indicators: Sore;Sharp Pain Intervention(s): Monitored during session;Premedicated before session;Repositioned    Home Living                      Prior Function            PT Goals (current goals can now be found in the care plan section) Progress towards PT goals: Progressing toward goals    Frequency           PT Plan Discharge  plan needs to be updated    Co-evaluation              AM-PAC PT "6 Clicks" Mobility   Outcome Measure  Help needed turning from your back to your side while in a flat bed without using bedrails?: A Little Help needed moving from lying on your back to sitting on the side of a flat bed without using bedrails?: A Lot Help needed moving to and from a bed to a chair (including a wheelchair)?: A Little Help needed standing up from a chair using your arms (e.g., wheelchair or bedside chair)?: A Little Help needed to walk in hospital room?: A Little Help needed climbing 3-5 steps with a railing? : A Lot 6 Click Score: 16    End of Session Equipment Utilized During Treatment: Gait belt Activity Tolerance: Patient tolerated treatment well Patient left: with call bell/phone within reach;in bed;with bed alarm set   PT Visit Diagnosis: Other abnormalities of gait and mobility (R26.89)     Time: 1610-9604 PT Time Calculation (min) (ACUTE  ONLY): 31 min  Charges:  $Gait Training: 23-37 mins                     Jannette Spanner PT, DPT Acute Rehabilitation Services Pager: [phone] Office: [phone]  York Ram E 04/14/2020, 11:45 AM

## 2020-04-14 NOTE — NC FL2 (Signed)
[place] LEVEL OF CARE SCREENING TOOL     IDENTIFICATION  Patient Name: Bridget Joyce Birthdate: 1955/09/03 Sex: female Admission Date (Current Location): 04/12/2020  [facility] and [place] Number:  Herbalist and Address:  [facility],  [place] [place], [place]      Provider Number: [id number]  Attending Physician Name and Address:  Mcarthur Rossetti  Relative Name and Phone Number:       Current Level of Care: Hospital Recommended Level of Care: [place] Prior Approval Number:    Date Approved/Denied:   PASRR Number: [id number]  Discharge Plan: SNF    Current Diagnoses: Patient Active Problem List   Diagnosis Date Noted  . Status post total replacement of left hip 04/12/2020  . Unilateral primary osteoarthritis, left hip 02/15/2020  . Arthritis of left hip 02/04/2020  . Anxiety 10/29/2019  . Fibromyalgia 10/29/2019  . GAD (generalized anxiety disorder) 04/14/2018  . Dysthymic disorder 04/14/2018  . Primary osteoarthritis of right knee 01/25/2017  . Mixed hyperlipidemia 07/13/2014  . Mild persistent asthma   12/19/2013  . Prediabetes   . Vitamin D deficiency   . Myopia 10/28/2012  . Morbid obesity (BMI 37.11) 06/10/2009  . GERD 05/26/2008  . Allergic rhinitis 05/20/2008  . Exercise-induced bronchospasm 05/20/2008  . Diverticulosis 09/02/2006    Orientation RESPIRATION BLADDER Height & Weight     Self,Time,Situation,Place  Normal Continent Weight: 226 lb (102.5 kg) Height:  5\' 5"  (165.1 cm)  BEHAVIORAL SYMPTOMS/MOOD NEUROLOGICAL BOWEL NUTRITION STATUS      Continent    AMBULATORY STATUS COMMUNICATION OF NEEDS Skin   Limited Assist Verbally Surgical wounds                       Personal Care Assistance Level of Assistance  Bathing Bathing Assistance: Limited assistance         Functional Limitations Info             SPECIAL CARE FACTORS FREQUENCY  PT (By licensed  PT),OT (By licensed OT)     PT Frequency: 5x/wk OT Frequency: 5x/wk            Contractures Contractures Info: Not present    Additional Factors Info  Code Status,Allergies,Psychotropic Code Status Info: Full Allergies Info: see MAR Psychotropic Info: see MAR         Current Medications (04/14/2020):  This is the current hospital active medication list Current Facility-Administered Medications  Medication Dose Route Frequency Provider Last Rate Last Admin  . 0.9 %  sodium chloride infusion   Intravenous Continuous Mcarthur Rossetti, MD 75 mL/hr at 04/13/20 0612 New Bag at 04/13/20 0612  . acetaminophen (TYLENOL) tablet 325-650 mg  325-650 mg Oral Q6H PRN Mcarthur Rossetti, MD   650 mg at 04/13/20 1713  . alum & mag hydroxide-simeth (MAALOX/MYLANTA) 200-200-20 MG/5ML suspension 30 mL  30 mL Oral Q4H PRN Mcarthur Rossetti, MD      . ascorbic acid (VITAMIN C) tablet 1,000 mg  1,000 mg Oral Daily Mcarthur Rossetti, MD   1,000 mg at 04/14/20 1020  . aspirin chewable tablet 81 mg  81 mg Oral BID Mcarthur Rossetti, MD   81 mg at 04/14/20 1020  . bisacodyl (DULCOLAX) suppository 10 mg  10 mg Rectal Daily PRN Mcarthur Rossetti, MD      . busPIRone (BUSPAR) tablet 15 mg  15 mg Oral QHS Mcarthur Rossetti, MD  15 mg at 04/13/20 2254  . diphenhydrAMINE (BENADRYL) 12.5 MG/5ML elixir 12.5-25 mg  12.5-25 mg Oral Q4H PRN Mcarthur Rossetti, MD      . docusate sodium (COLACE) capsule 100 mg  100 mg Oral BID Mcarthur Rossetti, MD   100 mg at 04/14/20 1020  . fluticasone (FLONASE) 50 MCG/ACT nasal spray 1 spray  1 spray Each Nare Daily Mcarthur Rossetti, MD      . gabapentin (NEURONTIN) capsule 100 mg  100 mg Oral TID Mcarthur Rossetti, MD   100 mg at 04/14/20 1020  . HYDROmorphone (DILAUDID) injection 0.5-1 mg  0.5-1 mg Intravenous Q4H PRN Mcarthur Rossetti, MD      . magnesium gluconate (MAGONATE) tablet 500 mg  500 mg Oral  Daily Efraim Kaufmann, RPH      . menthol-cetylpyridinium (CEPACOL) lozenge 3 mg  1 lozenge Oral PRN Mcarthur Rossetti, MD       Or  . phenol (CHLORASEPTIC) mouth spray 1 spray  1 spray Mouth/Throat PRN Mcarthur Rossetti, MD      . methocarbamol (ROBAXIN) tablet 500 mg  500 mg Oral Q6H PRN Mcarthur Rossetti, MD   500 mg at 04/14/20 1020   Or  . methocarbamol (ROBAXIN) 500 mg in dextrose 5 % 50 mL IVPB  500 mg Intravenous Q6H PRN Mcarthur Rossetti, MD   Stopped at 04/12/20 1601  . metoCLOPramide (REGLAN) tablet 5-10 mg  5-10 mg Oral Q8H PRN Mcarthur Rossetti, MD       Or  . metoCLOPramide (REGLAN) injection 5-10 mg  5-10 mg Intravenous Q8H PRN Mcarthur Rossetti, MD      . multivitamin with minerals tablet 1 tablet  1 tablet Oral Daily Mcarthur Rossetti, MD      . olopatadine (PATANOL) 0.1 % ophthalmic solution 1 drop  1 drop Both Eyes Daily Mcarthur Rossetti, MD      . ondansetron [facility]) tablet 4 mg  4 mg Oral Q6H PRN Mcarthur Rossetti, MD       Or  . ondansetron [facility]) injection 4 mg  4 mg Intravenous Q6H PRN Mcarthur Rossetti, MD      . oxyCODONE (Oxy IR/ROXICODONE) immediate release tablet 10-15 mg  10-15 mg Oral Q4H PRN Mcarthur Rossetti, MD   10 mg at 04/14/20 0308  . oxyCODONE (Oxy IR/ROXICODONE) immediate release tablet 5-10 mg  5-10 mg Oral Q4H PRN Mcarthur Rossetti, MD   5 mg at 04/14/20 1020  . pantoprazole (PROTONIX) EC tablet 40 mg  40 mg Oral Daily Mcarthur Rossetti, MD   40 mg at 04/14/20 1020  . polyethylene glycol (MIRALAX / GLYCOLAX) packet 17 g  17 g Oral Daily PRN Mcarthur Rossetti, MD      . zinc sulfate capsule 220 mg  220 mg Oral Daily Mcarthur Rossetti, MD      . zolpidem [facility]) tablet 5 mg  5 mg Oral QHS PRN Mcarthur Rossetti, MD         Discharge Medications: Please see discharge summary for a list of discharge medications.  Relevant Imaging Results:  Relevant  Lab Results:   Additional Information SS# [id number]  Lennart Pall, LCSW

## 2020-04-14 NOTE — TOC Progression Note (Signed)
Transition of Care [facility]) - Progression Note    Patient Details  Name: Bridget Joyce MRN: [id number] Date of Birth: 05/25/1955  Transition of Care [facility]) CM/SW Contact  Lennart Pall, LCSW Phone Number: 04/14/2020, 12:30 PM  Clinical Narrative:    Met with pt this morning per order from MD to assist with SNF.  Pt explains that she does not feel ready to return directly home given her limited mobility.  She does not have assistance at home and feels that short term rehab is safest/ best option.  MD aware and I will begin SNF bed search.  Will also need offering facility to start insurance auth.     Barriers to Discharge: Continued Medical Work up  Expected Discharge Plan and Services                           DME Arranged: 3-N-1,Walker rolling DME Agency: [employer] Date DME Agency Contacted: 04/13/20 Time DME Agency Contacted: 71 Representative spoke with at DME Agency: Ovid Curd HH Arranged: PT Pink Agency: [facility] ([facility]) (arranged via MD office prior to surgery)         Social Determinants of Health (SDOH) Interventions    Readmission Risk Interventions Readmission Risk Prevention Plan 04/13/2020  Post Dischage Appt Complete  Medication Screening Complete  Transportation Screening Complete  Some recent data might be hidden

## 2020-04-14 NOTE — Progress Notes (Signed)
Physical Therapy Treatment Patient Details Name: Bridget Joyce MRN: [id number] DOB: 1956/04/23 Today's Date: 04/14/2020    History of Present Illness Pt is a 64 year old female s/p L THA direct anterior approach    PT Comments    Pt premedicated approx 40 min prior to session and reports feeling better able to perform ambulation.  Pt also able to improve hip/knee flexion to advance [name].  Pt planning to d/c SNF due to no assist available at home.    Follow Up Recommendations  SNF     Equipment Recommendations  Rolling walker with 5" wheels    Recommendations for Other Services       Precautions / Restrictions Precautions Precautions: Fall Restrictions LLE Weight Bearing: Weight bearing as tolerated    Mobility  Bed Mobility Overal bed mobility: Needs Assistance Bed Mobility: Supine to Sit;Sit to Supine     Supine to sit: Min guard Sit to supine: Min assist   General bed mobility comments: pt able to self assist LEs over EOB however required assist for bil LEs onto bed (pt used both feet in gait belt to self assist this afternoon)  Transfers Overall transfer level: Needs assistance Equipment used: Rolling walker (2 wheeled) Transfers: Sit to/from Stand Sit to Stand: Min guard         General transfer comment: verbal cues for safe technique  Ambulation/Gait Ambulation/Gait assistance: Min guard Gait Distance (Feet): 200 Feet Assistive device: Rolling walker (2 wheeled) Gait Pattern/deviations: Step-to pattern;Antalgic;Trunk flexed     General Gait Details: pt tends to use momentum and forward lean to advance L LE, improved ability to perform Lt hip and knee flexion for swing this afternoon as pt attempting to perform heel strike (not consistent) pt performs step to with R LE leading due to difficulty with hip flexion and advancing L LE due reported pain   Stairs             Wheelchair Mobility    Modified Rankin (Stroke Patients Only)        Balance                                            Cognition Arousal/Alertness: Awake/alert Behavior During Therapy: WFL for tasks assessed/performed Overall Cognitive Status: Within Functional Limits for tasks assessed                                        Exercises      General Comments        Pertinent Vitals/Pain Pain Assessment: 0-10 Pain Score: 6  Pain Location: left hip with movement (reports only 3/10 at rest) Pain Descriptors / Indicators: Sore;Sharp Pain Intervention(s): Repositioned;Premedicated before session;Monitored during session    Home Living                      Prior Function            PT Goals (current goals can now be found in the care plan section) Progress towards PT goals: Progressing toward goals    Frequency    7X/week      PT Plan Current plan remains appropriate    Co-evaluation              AM-PAC PT "6  Clicks" Mobility   Outcome Measure  Help needed turning from your back to your side while in a flat bed without using bedrails?: A Little Help needed moving from lying on your back to sitting on the side of a flat bed without using bedrails?: A Lot Help needed moving to and from a bed to a chair (including a wheelchair)?: A Little Help needed standing up from a chair using your arms (e.g., wheelchair or bedside chair)?: A Little Help needed to walk in hospital room?: A Little Help needed climbing 3-5 steps with a railing? : A Lot 6 Click Score: 16    End of Session Equipment Utilized During Treatment: Gait belt Activity Tolerance: Patient tolerated treatment well Patient left: with call bell/phone within reach;in bed;with bed alarm set;with SCD's reapplied   PT Visit Diagnosis: Other abnormalities of gait and mobility (R26.89)     Time: 1500-1520 PT Time Calculation (min) (ACUTE ONLY): 20 min  Charges:  $Gait Training: 8-22 mins           Arlyce Dice, DPT Acute  Rehabilitation Services Pager: [phone] Office: [phone]  York Ram E 04/14/2020, 3:44 PM

## 2020-04-14 NOTE — Plan of Care (Signed)
  Problem: Pain Management: Goal: Pain level will decrease with appropriate interventions Outcome: Progressing   Problem: Pain Management: Goal: Pain level will decrease with appropriate interventions Outcome: Progressing   Problem: Education: Goal: Knowledge of General Education information will improve Description: Including pain rating scale, medication(s)/side effects and non-pharmacologic comfort measures Outcome: Progressing   Problem: Health Behavior/Discharge Planning: Goal: Ability to manage health-related needs will improve Outcome: Progressing   Problem: Activity: Goal: Risk for activity intolerance will decrease Outcome: Progressing   Problem: Coping: Goal: Level of anxiety will decrease Outcome: Progressing   Problem: Pain Managment: Goal: General experience of comfort will improve Outcome: Progressing   Problem: Safety: Goal: Ability to remain free from injury will improve Outcome: Progressing

## 2020-04-15 ENCOUNTER — Encounter (HOSPITAL_COMMUNITY): Payer: Self-pay | Admitting: Orthopaedic Surgery

## 2020-04-15 MED ORDER — METHOCARBAMOL 500 MG PO TABS: 500.0000 mg | ORAL_TABLET | Freq: Four times a day (QID) | ORAL | 1 refills | Status: DC | PRN

## 2020-04-15 MED ORDER — OXYCODONE HCL 5 MG PO TABS: 5.0000 mg | ORAL_TABLET | ORAL | 0 refills | Status: DC | PRN

## 2020-04-15 MED ORDER — ASPIRIN 81 MG PO CHEW: 81.0000 mg | CHEWABLE_TABLET | Freq: Two times a day (BID) | ORAL | 0 refills | Status: DC

## 2020-04-15 NOTE — Discharge Summary (Signed)
Patient ID: Bridget Joyce MRN: [id number] DOB/AGE: [birth year] 63 y.o.  Admit date: 04/12/2020 Discharge date: 04/15/2020  Admission Diagnoses:  Principal Problem:   Unilateral primary osteoarthritis, left hip Active Problems:   Status post total replacement of left hip   Discharge Diagnoses:  Same  Past Medical History:  Diagnosis Date   Allergy    rag weed, pollen,dust   Ankle fracture 12/2017   Left    Anxiety    mild; infrequent per patient   Asthma    Bursitis of hip    Diverticulosis    External hemorrhoids    Fibroid    Fibromyalgia    GERD (gastroesophageal reflux disease)    Migraine    Ocular   Obesity    Peripheral edema    infrequent per patient   Pre-diabetes    Vitamin D deficiency     Surgeries: Procedure(s): LEFT TOTAL HIP ARTHROPLASTY ANTERIOR APPROACH on 04/12/2020   Consultants:   Discharged Condition: Improved  Hospital Course: HADASSAH RANA is an 64 y.o. female who was admitted 04/12/2020 for operative treatment ofUnilateral primary osteoarthritis, left hip. Patient has severe unremitting pain that affects sleep, daily activities, and work/hobbies. After pre-op clearance the patient was taken to the operating room on 04/12/2020 and underwent  Procedure(s): LEFT TOTAL HIP ARTHROPLASTY ANTERIOR APPROACH.    Patient was given perioperative antibiotics:  Anti-infectives (From admission, onward)   Start     Dose/Rate Route Frequency Ordered Stop   04/12/20 2000  ceFAZolin (ANCEF) IVPB 1 g/50 mL premix        1 g 100 mL/hr over 30 Minutes Intravenous Every 6 hours 04/12/20 1519 04/13/20 0648   04/12/20 1030  ceFAZolin (ANCEF) IVPB 2g/100 mL premix        2 g 200 mL/hr over 30 Minutes Intravenous On call to O.R. 04/12/20 1024 04/12/20 1330       Patient was given sequential compression devices, early ambulation, and chemoprophylaxis to prevent DVT.  Patient benefited maximally from hospital stay and there were no  complications.    Recent vital signs:  Patient Vitals for the past 24 hrs:  BP Temp Temp src Pulse Resp SpO2  04/15/20 1309 131/67 99 F (37.2 C) -- 82 15 99 %  04/15/20 0548 136/72 99.5 F (37.5 C) Oral 81 16 100 %  04/14/20 2017 135/67 98.7 F (37.1 C) Oral 95 16 98 %     Recent laboratory studies:  Recent Labs    04/13/20 0302  WBC 10.8*  HGB 11.1*  HCT 33.7*  PLT 285  NA 140  K 4.5  CL 106  CO2 27  BUN 16  CREATININE 0.80  GLUCOSE 159*  CALCIUM 8.9     Discharge Medications:   Allergies as of 04/15/2020      Reactions   [place] Grass Extract Itching   Grass Pollen(k-o-r-t-swt Vern)    Mold Extract  [trichophyton] Itching   Molds & Smuts    Other Other (See Comments)   Dust mite/ unknown   Short Ragweed Pollen Ext       Medication List    STOP taking these medications   cyclobenzaprine 10 MG tablet Commonly known as: FLEXERIL   meloxicam 15 MG tablet Commonly known as: MOBIC     TAKE these medications   aspirin 81 MG chewable tablet Chew 1 tablet (81 mg total) by mouth 2 (two) times daily.   beclomethasone 80 MCG/ACT inhaler Commonly known as: QVAR Inhale 1 puff into the  lungs daily.   busPIRone 15 MG tablet Commonly known as: BUSPAR Take 1 tablet (15 mg total) by mouth 2 (two) times daily. What changed: when to take this   CALCIUM PO Take 600 mg by mouth daily. Citracal   fluticasone 50 MCG/ACT nasal spray Commonly known as: FLONASE Place 1 spray into both nostrils daily.   Magnesium 400 MG Caps Take 400 mg by mouth daily.   methocarbamol 500 MG tablet Commonly known as: ROBAXIN Take 1 tablet (500 mg total) by mouth every 6 (six) hours as needed for muscle spasms.   multivitamin tablet Take 1 tablet by mouth daily.   olopatadine 0.1 % ophthalmic solution Commonly known as: PATANOL Place 1 drop into both eyes daily.   oxyCODONE 5 MG immediate release tablet Commonly known as: Oxy IR/ROXICODONE Take 1-2 tablets (5-10 mg  total) by mouth every 4 (four) hours as needed for moderate pain (pain score 4-6).   VITAMIN C PO Take 1,000 mg by mouth daily.   Vitamin D (Ergocalciferol) 1.25 MG (50000 UNIT) Caps capsule Commonly known as: DRISDOL Take 1 capsule (50,000 Units total) by mouth every 7 (seven) days.   zinc gluconate 50 MG tablet Take 50 mg by mouth daily.            Durable Medical Equipment  (From admission, onward)         Start     Ordered   04/12/20 1652  DME 3 n 1  Once        04/12/20 1651   04/12/20 1652  DME Walker rolling  Once       Question Answer Comment  Walker: With 5 Inch Wheels   Patient needs a walker to treat with the following condition Status post total replacement of left hip      04/12/20 1651          Diagnostic Studies: US PELVIS LIMITED (TRANSABDOMINAL ONLY)  Result Date: 03/18/2020 CLINICAL DATA:  Palpable abnormality over the left inguinal region 3 weeks. Nonpainful. EXAM: LIMITED ULTRASOUND OF PELVIS TECHNIQUE: Limited transabdominal ultrasound examination of the pelvis was performed. COMPARISON:  None. FINDINGS: Ultrasound examination over the left inguinal in the region of clinical concern for palpable abnormality was performed. Examination demonstrates no evidence of focal solid mass or fluid collection. No evidence of inguinal hernia. A normal left inguinal lymph node is visualized. Images with and without Valsalva were obtained demonstrating no focal abnormality. IMPRESSION: Normal ultrasound examination of the left inguinal region. Electronically Signed   By: Marin Olp M.D.   On: 03/18/2020 15:17   DG C-Arm 1-60 Min-No Report  Result Date: 04/12/2020 Fluoroscopy was utilized by the requesting physician.  No radiographic interpretation.   DG HIP OPERATIVE UNILAT W OR W/O PELVIS LEFT  Result Date: 04/12/2020 CLINICAL DATA:  Provided history: Status post left total hip arthroplasty performed in OR. Provided fluoroscopy time: 15 seconds (3.3249 mGy).  EXAM: OPERATIVE left HIP (WITH PELVIS IF PERFORMED) 4 VIEWS TECHNIQUE: Fluoroscopic spot image(s) were submitted for interpretation post-operatively. COMPARISON:  MRI of the left hip 01/19/2020. FINDINGS: Four intraoperative fluoroscopic images of the left hip are submitted. There has been interval left total hip arthroplasty. The femoral and acetabular components appear well seated. No unexpected finding. IMPRESSION: Four intraoperative fluoroscopic images of the left hip, as described. Electronically Signed   By: Kellie Simmering DO   On: 04/12/2020 15:05    Disposition: Discharge disposition: 03-Skilled [place]  Contact information for follow-up providers    Mcarthur Rossetti, MD Follow up in 2 week(s).   Specialty: Orthopedic Surgery Contact information: [place] [address] [phone]        [facility] up.   Specialty: [place] Why: to provide home health physical therapy Contact information: [place] [address] [phone]            Contact information for after-discharge care    Destination    [facility] Preferred SNF .   Service: Skilled Nursing Contact information: [address] [place] [phone]                   Signed: Erskine Emery 04/15/2020, 1:36 PM

## 2020-04-15 NOTE — TOC Progression Note (Addendum)
Transition of Care [facility]) - Progression Note    Patient Details  Name: Bridget Joyce MRN: [id number] Date of Birth: Jan 22, 1956  [facility] CM/SW Contact  Lennart Pall, LCSW Phone Number: 04/15/2020, 10:38 AM  Clinical Narrative:    Have presented SNF bed offers to patient and she has accepted offer from [facility].  The facility is starting insurance authorization.  Medically ready to go as soon as [place] received (may take 1 day).     Barriers to Discharge: Continued Medical Work up  Expected Discharge Plan and Services                           DME Arranged: 3-N-1,Walker rolling DME Agency: [employer] Date DME Agency Contacted: 04/13/20 Time DME Agency Contacted: 44 Representative spoke with at DME Agency: Ovid Curd HH Arranged: PT [place] Agency: [facility] (arranged via MD office prior to surgery)         Social Determinants of Health (SDOH) Interventions    Readmission Risk Interventions Readmission Risk Prevention Plan 04/13/2020  Post Dischage Appt Complete  Medication Screening Complete  Transportation Screening Complete  Some recent data might be hidden

## 2020-04-15 NOTE — Progress Notes (Signed)
Physical Therapy Treatment Patient Details Name: Bridget Joyce MRN: [id number] DOB: 05-Nov-1955 Today's Date: 04/15/2020    History of Present Illness Pt is a 64 year old female s/p L THA direct anterior approach    PT Comments    POD # 3 am session Assisted OOB to amb in hallway required increased effort this session.  General bed mobility comments: required increased assist this session due to increased pain.  Also demonstarted and instructed pt how to use belt loop to self assist LE.  Required increased effort. General transfer comment: verbal cues for safe technique as well as increased time to power up. General Gait Details: decreased amb distance due to increased pain today vs yesterday.  Very uneven gait with decdreased stance time on L and difficulty advancing L LE.  Unsteady with turns.  VC's on proper walker to self distance and upright posture. Assisted back to bed and performed some TE's using belt to assist with HS and ABD/ADd.   Pt lives home alone and will need ST Rehab at SNF prior to safely D/C.   Follow Up Recommendations  SNF     Equipment Recommendations       Recommendations for Other Services       Precautions / Restrictions Precautions Precautions: Fall Restrictions Weight Bearing Restrictions: No LLE Weight Bearing: Weight bearing as tolerated    Mobility  Bed Mobility Overal bed mobility: Needs Assistance Bed Mobility: Supine to Sit;Sit to Supine     Supine to sit: Mod assist Sit to supine: Mod assist;Max assist   General bed mobility comments: required increased assist this session due to increased pain.  Also demonstarted and instructed pt how to use belt loop to self assist LE.  Required increased effort.  Transfers Overall transfer level: Needs assistance Equipment used: Rolling walker (2 wheeled) Transfers: Sit to/from [facility] Sit to Stand: Min guard Stand pivot transfers: Min guard       General transfer comment:  verbal cues for safe technique as well as increased time to power up.  Ambulation/Gait Ambulation/Gait assistance: Min guard Gait Distance (Feet): 53 Feet Assistive device: Rolling walker (2 wheeled) Gait Pattern/deviations: Step-to pattern;Antalgic;Trunk flexed     General Gait Details: decreased amb distance due to increased pain today vs yesterday.  Very uneven gait with decdreased stance time on L and difficulty advancing L LE.  Unsteady with turns.  VC's on proper walker to self distance and upright posture.   Stairs             Wheelchair Mobility    Modified Rankin (Stroke Patients Only)       Balance                                            Cognition Arousal/Alertness: Awake/alert Behavior During Therapy: WFL for tasks assessed/performed Overall Cognitive Status: Within Functional Limits for tasks assessed                                 General Comments: AxO x 3 very pleasant      Exercises  10 reps AP, knee presses and gluteal squeezes 5 reps HS and ABd using belt     General Comments        Pertinent Vitals/Pain Pain Assessment: 0-10 Pain Score: 3  Pain Location:  left hip with movement (reports only 3/10 at rest) Pain Descriptors / Indicators: Sore;Sharp Pain Intervention(s): Monitored during session;Premedicated before session    Home Living                      Prior Function            PT Goals (current goals can now be found in the care plan section) Progress towards PT goals: Progressing toward goals    Frequency    7X/week      PT Plan Current plan remains appropriate    Co-evaluation              AM-PAC PT "6 Clicks" Mobility   Outcome Measure  Help needed turning from your back to your side while in a flat bed without using bedrails?: A Lot Help needed moving from lying on your back to sitting on the side of a flat bed without using bedrails?: A Lot Help needed moving  to and from a bed to a chair (including a wheelchair)?: A Lot Help needed standing up from a chair using your arms (e.g., wheelchair or bedside chair)?: A Lot Help needed to walk in hospital room?: A Little Help needed climbing 3-5 steps with a railing? : A Lot 6 Click Score: 13    End of Session Equipment Utilized During Treatment: Gait belt Activity Tolerance: Patient tolerated treatment well Patient left: with call bell/phone within reach;in bed;with bed alarm set;with SCD's reapplied Nurse Communication: Mobility status PT Visit Diagnosis: Other abnormalities of gait and mobility (R26.89)     Time: 1020-1046 PT Time Calculation (min) (ACUTE ONLY): 26 min  Charges:  $Gait Training: 8-22 mins $Therapeutic Exercise: 8-22 mins                     Rica Koyanagi  PTA Acute  Rehabilitation Services Pager      [phone] Office      [phone]

## 2020-04-15 NOTE — Progress Notes (Signed)
Physical Therapy Treatment Patient Details Name: Bridget Joyce MRN: [id number] DOB: Apr 12, 1956 Today's Date: 04/15/2020    History of Present Illness Pt is a 64 year old female s/p L THA direct anterior approach    PT Comments    POD # 3 pm session Assisted OOB to amb again required increased time and effort due to increased c/o [pain and fatigue. Pt repeaters "my leg is longer than the other". General Gait Details: decreased amb distance due to increased pain today vs yesterday.  Very uneven gait with decdreased stance time on L and difficulty advancing L LE.  Unsteady with turns.  VC's on proper walker to self distance and upright posture. Assisted back to bed to perform some TE's.   Pt is awaiting authorization for SNF.   Follow Up Recommendations  SNF     Equipment Recommendations       Recommendations for Other Services       Precautions / Restrictions Precautions Precautions: Fall Restrictions Weight Bearing Restrictions: No LLE Weight Bearing: Weight bearing as tolerated    Mobility  Bed Mobility Overal bed mobility: Needs Assistance Bed Mobility: Supine to Sit;Sit to Supine     Supine to sit: Mod assist Sit to supine: Mod assist;Max assist   General bed mobility comments: required increased assist this session due to increased pain.  Also demonstarted and instructed pt how to use belt loop to self assist LE.  Required increased effort.  Transfers Overall transfer level: Needs assistance Equipment used: Rolling walker (2 wheeled) Transfers: Sit to/from [facility] Sit to Stand: Min guard Stand pivot transfers: Min guard       General transfer comment: verbal cues for safe technique as well as increased time to power up.  Ambulation/Gait Ambulation/Gait assistance: Min guard Gait Distance (Feet): 42 Feet Assistive device: Rolling walker (2 wheeled) Gait Pattern/deviations: Step-to pattern;Antalgic;Trunk flexed     General Gait  Details: decreased amb distance due to increased pain today vs yesterday.  Very uneven gait with decdreased stance time on L and difficulty advancing L LE.  Unsteady with turns.  VC's on proper walker to self distance and upright posture.   Stairs             Wheelchair Mobility    Modified Rankin (Stroke Patients Only)       Balance                                            Cognition Arousal/Alertness: Awake/alert Behavior During Therapy: WFL for tasks assessed/performed Overall Cognitive Status: Within Functional Limits for tasks assessed                                 General Comments: AxO x 3 very pleasant      Exercises      General Comments        Pertinent Vitals/Pain Pain Assessment: 0-10 Pain Score: 3  Pain Location: left hip with movement (reports only 3/10 at rest) Pain Descriptors / Indicators: Sore;Sharp Pain Intervention(s): Monitored during session;Premedicated before session    Home Living                      Prior Function            PT Goals (current goals can now  be found in the care plan section) Progress towards PT goals: Progressing toward goals    Frequency    7X/week      PT Plan Current plan remains appropriate    Co-evaluation              AM-PAC PT "6 Clicks" Mobility   Outcome Measure  Help needed turning from your back to your side while in a flat bed without using bedrails?: A Lot Help needed moving from lying on your back to sitting on the side of a flat bed without using bedrails?: A Lot Help needed moving to and from a bed to a chair (including a wheelchair)?: A Lot Help needed standing up from a chair using your arms (e.g., wheelchair or bedside chair)?: A Lot Help needed to walk in hospital room?: A Little Help needed climbing 3-5 steps with a railing? : A Lot 6 Click Score: 13    End of Session Equipment Utilized During Treatment: Gait belt Activity  Tolerance: Patient tolerated treatment well Patient left: with call bell/phone within reach;in bed;with bed alarm set;with SCD's reapplied Nurse Communication: Mobility status PT Visit Diagnosis: Other abnormalities of gait and mobility (R26.89)     Time: 3612-2449 PT Time Calculation (min) (ACUTE ONLY): 24 min  Charges:  $Gait Training: 8-22 mins $Therapeutic Exercise: 8-22 mins                     Rica Koyanagi  PTA Acute  Rehabilitation Services Pager      [phone] Office      [phone]

## 2020-04-15 NOTE — Progress Notes (Signed)
Subjective: 3 Days Post-Op Procedure(s) (LRB): LEFT TOTAL HIP ARTHROPLASTY ANTERIOR APPROACH (Left) Patient reports pain as moderate.  Main complaint is leg lengths are off.   Objective: Vital signs in last 24 hours: Temp:  [98.7 F (37.1 C)-99.5 F (37.5 C)] 99 F (37.2 C) (12/17 1309) Pulse Rate:  [81-95] 82 (12/17 1309) Resp:  [15-16] 15 (12/17 1309) BP: (131-136)/(67-72) 131/67 (12/17 1309) SpO2:  [98 %-100 %] 99 % (12/17 1309)  Intake/Output from previous day: 12/16 0701 - 12/17 0700 In: 890 [P.O.:890] Out: 4350 [Urine:4350] Intake/Output this shift: Total I/O In: 500 [P.O.:500] Out: 0   Recent Labs    04/13/20 0302  HGB 11.1*   Recent Labs    04/13/20 0302  WBC 10.8*  RBC 3.56*  HCT 33.7*  PLT 285   Recent Labs    04/13/20 0302  NA 140  K 4.5  CL 106  CO2 27  BUN 16  CREATININE 0.80  GLUCOSE 159*  CALCIUM 8.9   No results for input(s): LABPT, INR in the last 72 hours.  Intact pulses distally Dorsiflexion/Plantar flexion intact Incision: moderate drainage Compartment soft   Assessment/Plan: 3 Days Post-Op Procedure(s) (LRB): LEFT TOTAL HIP ARTHROPLASTY ANTERIOR APPROACH (Left) Up with therapy Discharge to SNF when bed available  Dressing changed.     Shama Monfils 04/15/2020, 1:37 PM

## 2020-04-16 NOTE — TOC Progression Note (Signed)
Transition of Care [facility]) - Progression Note    Patient Details  Name: Bridget Joyce MRN: [id number] Date of Birth: [birth year]  Transition of Care [facility]) CM/SW Contact  Lennart Pall, LCSW Phone Number: 04/16/2020, 11:45 AM  Clinical Narrative:     Continue to await insurance auth.    Barriers to Discharge: Continued Medical Work up  Expected Discharge Plan and Services           Expected Discharge Date: 04/15/20               DME Arranged: 3-N-1,Walker rolling DME Agency: [employer] Date DME Agency Contacted: 04/13/20 Time DME Agency Contacted: 9 Representative spoke with at DME Agency: Ovid Curd HH Arranged: PT [place]: [facility] (arranged via MD office prior to surgery)         Social Determinants of Health (SDOH) Interventions    Readmission Risk Interventions Readmission Risk Prevention Plan 04/13/2020  Post Dischage Appt Complete  Medication Screening Complete  Transportation Screening Complete  Some recent data might be hidden

## 2020-04-16 NOTE — Progress Notes (Signed)
Physical Therapy Treatment Patient Details Name: Bridget Joyce MRN: [id number] DOB: 12-Jul-1955 Today's Date: 04/16/2020    History of Present Illness Pt is a 64 year old female s/p L THA direct anterior approach    PT Comments    Progressing with mobility. Pt continues to require Min assist for mobility tasks. She is unsafe and unsteady with ambulation. Pt lives alone and would need to be ably to safely demonstrate ability to manage at home with little to no assistance. Continue to recommend ST rehab at SNF to maximize safety and independence with functional mobility.     Follow Up Recommendations  SNF     Equipment Recommendations  Rolling walker with 5" wheels    Recommendations for Other Services       Precautions / Restrictions Precautions Precautions: Fall Restrictions Weight Bearing Restrictions: No LLE Weight Bearing: Weight bearing as tolerated    Mobility  Bed Mobility Overal bed mobility: Needs Assistance Bed Mobility: Sit to Supine       Sit to supine: Min assist;HOB elevated   General bed mobility comments: Assist for L LE onto bed. Pt also required use of gait belt as leg lifter. Cues for safety, technique. Task remains effortful and pt needs to be able to complete this unassisted since she lives alone  Transfers Overall transfer level: Needs assistance Equipment used: Rolling walker (2 wheeled) Transfers: Sit to/from Stand Sit to Stand: Min guard         General transfer comment: Min guard for safety. Cues for safety, technqiue, hand placement.  Ambulation/Gait Ambulation/Gait assistance: Min assist Gait Distance (Feet): 50 Feet Assistive device: Rolling walker (2 wheeled) Gait Pattern/deviations: Step-to pattern;Step-through pattern;Decreased stride length     General Gait Details: Unsafe and unsteady. Assist to stabilize throughout distance. Cues for safety, pacing, RW proximity. Uneven gait with decreased stance time on L. Difficulty  advancing L LE intermittently.   Stairs             Wheelchair Mobility    Modified Rankin (Stroke Patients Only)       Balance Overall balance assessment: Needs assistance         Standing balance support: Bilateral upper extremity supported Standing balance-Leahy Scale: Poor                              Cognition Arousal/Alertness: Awake/alert Behavior During Therapy: WFL for tasks assessed/performed Overall Cognitive Status: Within Functional Limits for tasks assessed                                        Exercises Total Joint Exercises Ankle Circles/Pumps: AROM;20 reps;Supine Quad Sets: AROM;Both;10 reps Hip ABduction/ADduction: AAROM;Left;10 reps;Supine Long Arc Quad: AROM;Left;5 reps;Seated Knee Flexion: AAROM;Left;10 reps;Supine    General Comments        Pertinent Vitals/Pain Pain Assessment: 0-10 Pain Score: 5  Pain Location: L hip/thigh Pain Descriptors / Indicators: Discomfort;Sore;Aching;Tightness Pain Intervention(s): Limited activity within patient's tolerance;Monitored during session;Repositioned;Ice applied    Home Living                      Prior Function            PT Goals (current goals can now be found in the care plan section) Progress towards PT goals: Progressing toward goals    Frequency  7X/week      PT Plan Current plan remains appropriate    Co-evaluation              AM-PAC PT "6 Clicks" Mobility   Outcome Measure  Help needed turning from your back to your side while in a flat bed without using bedrails?: A Little Help needed moving from lying on your back to sitting on the side of a flat bed without using bedrails?: A Lot Help needed moving to and from a bed to a chair (including a wheelchair)?: A Little Help needed standing up from a chair using your arms (e.g., wheelchair or bedside chair)?: A Little Help needed to walk in hospital room?: A Little Help  needed climbing 3-5 steps with a railing? : A Little 6 Click Score: 17    End of Session Equipment Utilized During Treatment: Gait belt Activity Tolerance: Patient tolerated treatment well Patient left: in bed;with call bell/phone within reach;with bed alarm set   PT Visit Diagnosis: Other abnormalities of gait and mobility (R26.89);Pain Pain - Right/Left: Left Pain - part of body: Hip     Time: 6811-5726 PT Time Calculation (min) (ACUTE ONLY): 20 min  Charges:  $Gait Training: 8-22 mins                        Doreatha Massed, PT Acute Rehabilitation  Office: [phone] Pager: [phone]

## 2020-04-16 NOTE — Progress Notes (Signed)
Physical Therapy Treatment Patient Details Name: Bridget Joyce MRN: [id number] DOB: 11/28/1955 Today's Date: 04/16/2020    History of Present Illness Pt is a 64 year old female s/p L THA direct anterior approach    PT Comments    Progressing slowly with mobility. Pt continues to require Min assist for mobility. She is still unable to get in/out of bed unassisted. Pt lives alone and will need to be able to mobilize at a Supervision level (at the very least) before able to d/c home safely. Continue to recommend ST rehab at SNF to maximize independence and safety with functional mobility prior to returning home alone.     Follow Up Recommendations  SNF     Equipment Recommendations  Rolling walker with 5" wheels    Recommendations for Other Services       Precautions / Restrictions Precautions Precautions: Fall Restrictions Weight Bearing Restrictions: No LLE Weight Bearing: Weight bearing as tolerated    Mobility  Bed Mobility Overal bed mobility: Needs Assistance Bed Mobility: Supine to Sit;Sit to Supine     Supine to sit: Min assist;HOB elevated Sit to supine: Min assist;HOB elevated   General bed mobility comments: Assist for L LE onto bed. Pt also required use of gait belt as leg lifter. Cues for safety, technique. Task remains effortful and pt needs to be able to complete this unassisted since she lives alone  Transfers Overall transfer level: Needs assistance Equipment used: Rolling walker (2 wheeled) Transfers: Sit to/from Stand Sit to Stand: Min guard         General transfer comment: Min guard for safety. Cues for safety, technqiue, hand placement.  Ambulation/Gait Ambulation/Gait assistance: Min assist Gait Distance (Feet): 75 Feet Assistive device: Rolling walker (2 wheeled) Gait Pattern/deviations: Step-to pattern;Step-through pattern;Decreased stride length     General Gait Details: Unsafe and unsteady. Assist to stabilize throughout distance.  Cues for safety, pacing, RW proximity. Uneven gait with decreased stance time on L. Difficulty advancing L LE intermittently.   Stairs             Wheelchair Mobility    Modified Rankin (Stroke Patients Only)       Balance Overall balance assessment: Needs assistance         Standing balance support: Bilateral upper extremity supported Standing balance-Leahy Scale: Poor                              Cognition Arousal/Alertness: Awake/alert Behavior During Therapy: WFL for tasks assessed/performed Overall Cognitive Status: Within Functional Limits for tasks assessed                                        Exercises      General Comments        Pertinent Vitals/Pain Pain Assessment: 0-10 Pain Score: 5  Pain Location: L hip/thigh Pain Descriptors / Indicators: Discomfort;Sore;Aching;Tightness Pain Intervention(s): Limited activity within patient's tolerance;Monitored during session;Repositioned    Home Living                      Prior Function            PT Goals (current goals can now be found in the care plan section) Progress towards PT goals: Progressing toward goals    Frequency    7X/week  PT Plan Current plan remains appropriate    Co-evaluation              AM-PAC PT "6 Clicks" Mobility   Outcome Measure  Help needed turning from your back to your side while in a flat bed without using bedrails?: A Little Help needed moving from lying on your back to sitting on the side of a flat bed without using bedrails?: A Little Help needed moving to and from a bed to a chair (including a wheelchair)?: A Little Help needed standing up from a chair using your arms (e.g., wheelchair or bedside chair)?: A Little Help needed to walk in hospital room?: A Little Help needed climbing 3-5 steps with a railing? : A Little 6 Click Score: 18    End of Session Equipment Utilized During Treatment: Gait  belt Activity Tolerance: Patient tolerated treatment well Patient left: in bed;with call bell/phone within reach   PT Visit Diagnosis: Other abnormalities of gait and mobility (R26.89);Pain Pain - Right/Left: Left Pain - part of body: Hip     Time: 2761-4709 PT Time Calculation (min) (ACUTE ONLY): 12 min  Charges:  $Gait Training: 8-22 mins                         Doreatha Massed, PT Acute Rehabilitation  Office: [phone] Pager: [phone]

## 2020-04-16 NOTE — Plan of Care (Signed)
  Problem: Clinical Measurements: Goal: Diagnostic test results will improve Outcome: Progressing   Problem: Clinical Measurements: Goal: Respiratory complications will improve Outcome: Progressing   Problem: Clinical Measurements: Goal: Cardiovascular complication will be avoided Outcome: Progressing   Problem: Coping: Goal: Level of anxiety will decrease Outcome: Progressing   Problem: Pain Managment: Goal: General experience of comfort will improve Outcome: Progressing   Problem: Skin Integrity: Goal: Risk for impaired skin integrity will decrease Outcome: Progressing

## 2020-04-16 NOTE — Progress Notes (Signed)
Patient ID: Bridget Joyce, female   DOB: 26-Jan-1956, 64 y.o.   MRN: [id number] There is been no acute changes.  She is slowly improving her mobility and feeling better overall.  She has a good attitude about everything.  Her vital signs are stable and her left operative hip is stable.  She is waiting insurance authorization for short-term skilled nursing placement.  She lives alone at home.  If this is approved today she can certainly be discharged today.  However, he will likely be Monday before anything is known.

## 2020-04-17 NOTE — Progress Notes (Addendum)
   Subjective: 5 Days Post-Op Procedure(s) (LRB): LEFT TOTAL HIP ARTHROPLASTY ANTERIOR APPROACH (Left) Patient reports pain as moderate.    Objective: Vital signs in last 24 hours: Temp:  [98.3 F (36.8 C)-99.1 F (37.3 C)] 98.3 F (36.8 C) (12/19 0426) Pulse Rate:  [78-82] 82 (12/19 0426) Resp:  [17-18] 17 (12/19 0426) BP: (113-136)/(61-70) 113/61 (12/19 0426) SpO2:  [98 %-100 %] 98 % (12/19 0426)  Intake/Output from previous day: 12/18 0701 - 12/19 0700 In: 1680 [P.O.:1680] Out: -  Intake/Output this shift: Total I/O In: 240 [P.O.:240] Out: -   No results for input(s): HGB in the last 72 hours. No results for input(s): WBC, RBC, HCT, PLT in the last 72 hours. No results for input(s): NA, K, CL, CO2, BUN, CREATININE, GLUCOSE, CALCIUM in the last 72 hours. No results for input(s): LABPT, INR in the last 72 hours.  Neurologically intact No results found.  Assessment/Plan: 5 Days Post-Op Procedure(s) (LRB): LEFT TOTAL HIP ARTHROPLASTY ANTERIOR APPROACH (Left) Up with therapy, lives alone, SNF .   Bridget Joyce 04/17/2020, 9:30 AM

## 2020-04-17 NOTE — Progress Notes (Addendum)
Physical Therapy Treatment Patient Details Name: Bridget Joyce MRN: [id number] DOB: 1956/03/15 Today's Date: 04/17/2020    History of Present Illness Pt is a 64 year old female s/p L THA direct anterior approach    PT Comments    Pt continues to have most difficulty with bed mobility. She is still unable to perform task without assistance of therapist and gait belt as a leg lifter. Moderate pain with activity. Gait is slowly improving. Will continue to follow during hospital stay. Continue to recommend a short rehab stay to improve functional mobility and safety prior to returning home alone.     Follow Up Recommendations  SNF     Equipment Recommendations  Rolling walker with 5" wheels    Recommendations for Other Services       Precautions / Restrictions Precautions Precautions: Fall Restrictions Weight Bearing Restrictions: No LLE Weight Bearing: Weight bearing as tolerated    Mobility  Bed Mobility Overal bed mobility: Needs Assistance Bed Mobility: Supine to Sit;Sit to Supine     Supine to sit: Min assist;HOB elevated Sit to supine: Min assist;HOB elevated   General bed mobility comments: Assist for L LE onto bed. Pt also required use of gait belt as leg lifter. Cues for safety, technique. Task remains effortful and pt needs to be able to complete this unassisted since she lives alone  Transfers Overall transfer level: Needs assistance Equipment used: Rolling walker (2 wheeled) Transfers: Sit to/from Stand Sit to Stand: Min guard         General transfer comment: Min guard for safety. Cues for safety, technqiue, hand placement.  Ambulation/Gait Ambulation/Gait assistance: Min assist Gait Distance (Feet): 75 Feet Assistive device: Rolling walker (2 wheeled) Gait Pattern/deviations: Step-to pattern;Step-through pattern;Decreased stride length     General Gait Details: Cues for safety, pacing. Intermittent assist to steady. Improved ability to advance L  LE on today.   Stairs             Wheelchair Mobility    Modified Rankin (Stroke Patients Only)       Balance Overall balance assessment: Needs assistance         Standing balance support: Bilateral upper extremity supported Standing balance-Leahy Scale: Poor                              Cognition Arousal/Alertness: Awake/alert Behavior During Therapy: WFL for tasks assessed/performed Overall Cognitive Status: Within Functional Limits for tasks assessed                                        Exercises      General Comments        Pertinent Vitals/Pain Pain Assessment: 0-10 Pain Score: 5  Pain Location: L hip/thigh Pain Descriptors / Indicators: Discomfort;Sore;Aching;Tightness Pain Intervention(s): Monitored during session;Limited activity within patient's tolerance;Repositioned    Home Living                      Prior Function            PT Goals (current goals can now be found in the care plan section) Progress towards PT goals: Progressing toward goals    Frequency    7X/week      PT Plan Current plan remains appropriate    Co-evaluation  AM-PAC PT "6 Clicks" Mobility   Outcome Measure  Help needed turning from your back to your side while in a flat bed without using bedrails?: A Little Help needed moving from lying on your back to sitting on the side of a flat bed without using bedrails?: A Little Help needed moving to and from a bed to a chair (including a wheelchair)?: A Little Help needed standing up from a chair using your arms (e.g., wheelchair or bedside chair)?: A Little Help needed to walk in hospital room?: A Little Help needed climbing 3-5 steps with a railing? : A Little 6 Click Score: 18    End of Session Equipment Utilized During Treatment: Gait belt Activity Tolerance: Patient tolerated treatment well Patient left: in bed;with call bell/phone within reach;with  bed alarm set   PT Visit Diagnosis: Other abnormalities of gait and mobility (R26.89);Pain Pain - Right/Left: Left Pain - part of body: Hip     Time: 7124-5809 PT Time Calculation (min) (ACUTE ONLY): 11 min  Charges:  $Gait Training: 8-22 mins                        Doreatha Massed, PT Acute Rehabilitation  Office: [phone] Pager: [phone]

## 2020-04-17 NOTE — Plan of Care (Signed)
Plan of care reviewed and discussed with the patient. 

## 2020-04-17 NOTE — Plan of Care (Signed)
°  Problem: Pain Management: Goal: Pain level will decrease with appropriate interventions Outcome: Progressing   Problem: Activity: Goal: Ability to avoid complications of mobility impairment will improve Outcome: Progressing   Problem: Education: Goal: Knowledge of General Education information will improve Description: Including pain rating scale, medication(s)/side effects and non-pharmacologic comfort measures Outcome: Progressing   Problem: Health Behavior/Discharge Planning: Goal: Ability to manage health-related needs will improve Outcome: Progressing   Problem: Clinical Measurements: Goal: Respiratory complications will improve Outcome: Progressing   Problem: Clinical Measurements: Goal: Cardiovascular complication will be avoided Outcome: Progressing

## 2020-04-18 ENCOUNTER — Encounter (HOSPITAL_COMMUNITY): Payer: Self-pay | Admitting: Orthopaedic Surgery

## 2020-04-18 LAB — SARS CORONAVIRUS 2 BY RT PCR (HOSPITAL ORDER, PERFORMED IN ~~LOC~~ HOSPITAL LAB): SARS Coronavirus 2: NEGATIVE

## 2020-04-18 NOTE — Progress Notes (Signed)
Patient ID: Bridget Joyce, female   DOB: 05/15/1955, 65 y.o.   MRN: [id number] There is been no acute changes in her hospitalization.  We are awaiting authorization for short-term skilled nursing placement through her insurance company.  She can be discharged today if a bed is available.  Her left operative hip is stable.

## 2020-04-18 NOTE — Progress Notes (Signed)
Physical Therapy Treatment Patient Details Name: Bridget Joyce MRN: [id number] DOB: 03-Apr-1956 Today's Date: 04/18/2020    History of Present Illness Pt is a 64 year old female s/p L THA direct anterior approach    PT Comments    Progressing with mobility. Pt stated she still had trouble with bed mobility on today. Pt stated she has walked a few times with nursing. Will continue to follow and progress activity as tolerated.   Follow Up Recommendations  SNF     Equipment Recommendations  Rolling walker with 5" wheels    Recommendations for Other Services       Precautions / Restrictions Precautions Precautions: Fall Restrictions Weight Bearing Restrictions: No LLE Weight Bearing: Weight bearing as tolerated    Mobility  Bed Mobility               General bed mobility comments: oob in bathroom  Transfers Overall transfer level: Needs assistance Equipment used: Rolling walker (2 wheeled) Transfers: Sit to/from Stand Sit to Stand: Supervision         General transfer comment: for safety. cues hand placement  Ambulation/Gait Ambulation/Gait assistance: Min guard Gait Distance (Feet): 125 Feet Assistive device: Rolling walker (2 wheeled) Gait Pattern/deviations: Step-through pattern;Decreased stride length     General Gait Details: Min guard for safety. Fair gait speed.   Stairs             Wheelchair Mobility    Modified Rankin (Stroke Patients Only)       Balance Overall balance assessment: Needs assistance         Standing balance support: Bilateral upper extremity supported Standing balance-Leahy Scale: Poor                              Cognition Arousal/Alertness: Awake/alert Behavior During Therapy: WFL for tasks assessed/performed Overall Cognitive Status: Within Functional Limits for tasks assessed                                        Exercises Total Joint Exercises Knee Flexion:  AROM;Left;10 reps;Standing General Exercises - Lower Extremity Hip ABduction/ADduction: AROM;Left;5 reps Hip Flexion/Marching: AROM;Left;10 reps Heel Raises: AROM;Both;10 reps    General Comments        Pertinent Vitals/Pain Pain Assessment: 0-10 Pain Score: 5  Pain Location: L hip/thigh Pain Descriptors / Indicators: Discomfort;Sore;Aching;Tightness Pain Intervention(s): Limited activity within patient's tolerance;Monitored during session;Repositioned    Home Living                      Prior Function            PT Goals (current goals can now be found in the care plan section) Progress towards PT goals: Progressing toward goals    Frequency    7X/week      PT Plan Current plan remains appropriate    Co-evaluation              AM-PAC PT "6 Clicks" Mobility   Outcome Measure  Help needed turning from your back to your side while in a flat bed without using bedrails?: A Little Help needed moving from lying on your back to sitting on the side of a flat bed without using bedrails?: A Little Help needed moving to and from a bed to a chair (including a wheelchair)?: A Little Help  needed standing up from a chair using your arms (e.g., wheelchair or bedside chair)?: A Little Help needed to walk in hospital room?: A Little Help needed climbing 3-5 steps with a railing? : A Little 6 Click Score: 18    End of Session Equipment Utilized During Treatment: Gait belt Activity Tolerance: Patient tolerated treatment well Patient left:  (pt requested to return to bathroom so she could finish her wash up)   PT Visit Diagnosis: Other abnormalities of gait and mobility (R26.89);Pain Pain - Right/Left: Left Pain - part of body: Hip     Time: 9507-2257 PT Time Calculation (min) (ACUTE ONLY): 13 min  Charges:  $Gait Training: 8-22 mins                         Doreatha Massed, PT Acute Rehabilitation  Office: [phone] Pager: [phone]

## 2020-04-18 NOTE — Plan of Care (Signed)
Care plan reviewed and discussed with the patient.

## 2020-04-18 NOTE — Discharge Summary (Signed)
Patient ID: Bridget Joyce MRN: [id number] DOB/AGE: [birth year] 64 y.o.  Admit date: 04/12/2020 Discharge date: 04/18/2020  Admission Diagnoses:  Principal Problem:   Unilateral primary osteoarthritis, left hip Active Problems:   Status post total replacement of left hip   Discharge Diagnoses:  Same  Past Medical History:  Diagnosis Date  . Allergy    rag weed, pollen,dust  . Ankle fracture 12/2017   Left   . Anxiety    mild; infrequent per patient  . Asthma   . Bursitis of hip   . Diverticulosis   . External hemorrhoids   . Fibroid   . Fibromyalgia   . GERD (gastroesophageal reflux disease)   . Migraine    Ocular  . Obesity   . Peripheral edema    infrequent per patient  . Pre-diabetes   . Vitamin D deficiency     Surgeries: Procedure(s): LEFT TOTAL HIP ARTHROPLASTY ANTERIOR APPROACH on 04/12/2020   Consultants:   Discharged Condition: Improved  Hospital Course: Bridget Joyce is an 64 y.o. female who was admitted 04/12/2020 for operative treatment ofUnilateral primary osteoarthritis, left hip. Patient has severe unremitting pain that affects sleep, daily activities, and work/hobbies. After pre-op clearance the patient was taken to the operating room on 04/12/2020 and underwent  Procedure(s): LEFT TOTAL HIP ARTHROPLASTY ANTERIOR APPROACH.    Patient was given perioperative antibiotics:  Anti-infectives (From admission, onward)   Start     Dose/Rate Route Frequency Ordered Stop   04/12/20 2000  ceFAZolin (ANCEF) IVPB 1 g/50 mL premix        1 g 100 mL/hr over 30 Minutes Intravenous Every 6 hours 04/12/20 1519 04/13/20 0648   04/12/20 1030  ceFAZolin (ANCEF) IVPB 2g/100 mL premix        2 g 200 mL/hr over 30 Minutes Intravenous On call to O.R. 04/12/20 1024 04/12/20 1330       Patient was given sequential compression devices, early ambulation, and chemoprophylaxis to prevent DVT.  Patient benefited maximally from hospital stay and there were no  complications.    Recent vital signs:  Patient Vitals for the past 24 hrs:  BP Temp Temp src Pulse Resp SpO2  04/18/20 0538 130/72 97.9 F (36.6 C) Oral 73 16 99 %  04/17/20 2053 126/75 98.5 F (36.9 C) Oral 87 15 100 %     Recent laboratory studies: No results for input(s): WBC, HGB, HCT, PLT, NA, K, CL, CO2, BUN, CREATININE, GLUCOSE, INR, CALCIUM in the last 72 hours.  Invalid input(s): PT, 2   Discharge Medications:   Allergies as of 04/18/2020      Reactions   [place] Grass Extract Itching   Grass Pollen(k-o-r-t-swt Vern)    Mold Extract  [trichophyton] Itching   Molds & Smuts    Other Other (See Comments)   Dust mite/ unknown   Short Ragweed Pollen Ext       Medication List    STOP taking these medications   cyclobenzaprine 10 MG tablet Commonly known as: FLEXERIL   meloxicam 15 MG tablet Commonly known as: MOBIC     TAKE these medications   aspirin 81 MG chewable tablet Chew 1 tablet (81 mg total) by mouth 2 (two) times daily.   beclomethasone 80 MCG/ACT inhaler Commonly known as: QVAR Inhale 1 puff into the lungs daily.   busPIRone 15 MG tablet Commonly known as: BUSPAR Take 1 tablet (15 mg total) by mouth 2 (two) times daily. What changed: when to take this  CALCIUM PO Take 600 mg by mouth daily. Citracal   fluticasone 50 MCG/ACT nasal spray Commonly known as: FLONASE Place 1 spray into both nostrils daily.   Magnesium 400 MG Caps Take 400 mg by mouth daily.   methocarbamol 500 MG tablet Commonly known as: ROBAXIN Take 1 tablet (500 mg total) by mouth every 6 (six) hours as needed for muscle spasms.   multivitamin tablet Take 1 tablet by mouth daily.   olopatadine 0.1 % ophthalmic solution Commonly known as: PATANOL Place 1 drop into both eyes daily.   oxyCODONE 5 MG immediate release tablet Commonly known as: Oxy IR/ROXICODONE Take 1-2 tablets (5-10 mg total) by mouth every 4 (four) hours as needed for moderate pain (pain score  4-6).   VITAMIN C PO Take 1,000 mg by mouth daily.   Vitamin D (Ergocalciferol) 1.25 MG (50000 UNIT) Caps capsule Commonly known as: DRISDOL Take 1 capsule (50,000 Units total) by mouth every 7 (seven) days.   zinc gluconate 50 MG tablet Take 50 mg by mouth daily.            Durable Medical Equipment  (From admission, onward)         Start     Ordered   04/12/20 1652  DME 3 n 1  Once        04/12/20 1651   04/12/20 1652  DME Walker rolling  Once       Question Answer Comment  Walker: With 5 Inch Wheels   Patient needs a walker to treat with the following condition Status post total replacement of left hip      04/12/20 1651          Diagnostic Studies: DG C-Arm 1-60 Min-No Report  Result Date: 04/12/2020 Fluoroscopy was utilized by the requesting physician.  No radiographic interpretation.   DG HIP OPERATIVE UNILAT W OR W/O PELVIS LEFT  Result Date: 04/12/2020 CLINICAL DATA:  Provided history: Status post left total hip arthroplasty performed in OR. Provided fluoroscopy time: 15 seconds (3.3249 mGy). EXAM: OPERATIVE left HIP (WITH PELVIS IF PERFORMED) 4 VIEWS TECHNIQUE: Fluoroscopic spot image(s) were submitted for interpretation post-operatively. COMPARISON:  MRI of the left hip 01/19/2020. FINDINGS: Four intraoperative fluoroscopic images of the left hip are submitted. There has been interval left total hip arthroplasty. The femoral and acetabular components appear well seated. No unexpected finding. IMPRESSION: Four intraoperative fluoroscopic images of the left hip, as described. Electronically Signed   By: Kellie Simmering DO   On: 04/12/2020 15:05    Disposition: Discharge disposition: 03-Skilled [place] information for follow-up providers    Bridget Rossetti, MD Follow up in 2 week(s).   Specialty: Orthopedic Surgery Contact information: [place] [address] [phone]        [facility]  Follow up.   Specialty: [place] Why: to provide home health physical therapy Contact information: [place] [address] [phone]            Contact information for after-discharge care    Destination    [facility] Preferred SNF .   Service: Skilled Nursing Contact information: [address] [employer] [place] [phone]                   Signed: Mcarthur Joyce 04/18/2020, 7:29 AM

## 2020-04-18 NOTE — TOC Progression Note (Signed)
Transition of Care [facility]) - Progression Note    Patient Details  Name: Bridget Joyce MRN: [id number] Date of Birth: [birth year]  Transition of Care [facility]) CM/SW [place], LCSW Phone Number: 04/18/2020, 5:16 PM  Clinical Narrative:    TOC received notice BCBS authorization was received at [facility] after admitting hours. The SNF will accept the patient in the am.    Barriers to Discharge: Barriers Resolved  Expected Discharge Plan and Services           Expected Discharge Date: 04/18/20               DME Arranged: 3-N-1,Walker rolling DME Agency: [employer] Date DME Agency Contacted: 04/13/20 Time DME Agency Contacted: 3 Representative spoke with at DME Agency: Ovid Curd HH Arranged: PT [place]: [facility] (arranged via MD office prior to surgery)         Social Determinants of Health (SDOH) Interventions    Readmission Risk Interventions Readmission Risk Prevention Plan 04/13/2020  Post Dischage Appt Complete  Medication Screening Complete  Transportation Screening Complete  Some recent data might be hidden

## 2020-04-18 NOTE — Progress Notes (Signed)
The patient is now requesting to be discharged to home instead of rehab.  She states she is more confident in her ability to get in and out of bed without assistance and has demonstrated the same quite well.  I did reiterate to her that Dr. Ninfa Linden and PT would be the primary ones to determine if discharge home would be a viable option.

## 2020-04-19 ENCOUNTER — Telehealth: Payer: Self-pay | Admitting: *Deleted

## 2020-04-19 ENCOUNTER — Telehealth: Payer: Self-pay | Admitting: Orthopaedic Surgery

## 2020-04-19 MED ORDER — METHOCARBAMOL 500 MG PO TABS: 500.0000 mg | ORAL_TABLET | Freq: Four times a day (QID) | ORAL | 1 refills | Status: DC | PRN

## 2020-04-19 MED ORDER — ASPIRIN 81 MG PO CHEW: 81.0000 mg | CHEWABLE_TABLET | Freq: Two times a day (BID) | ORAL | 0 refills | Status: DC

## 2020-04-19 MED ORDER — OXYCODONE HCL 5 MG PO TABS: 5.0000 mg | ORAL_TABLET | ORAL | 0 refills | Status: DC | PRN

## 2020-04-19 NOTE — Progress Notes (Signed)
Patient ID: Bridget Joyce, female   DOB: [birth year], 64 y.o.   MRN: [id number] Has been approved for SNF.  Can be discharged this am.

## 2020-04-19 NOTE — Progress Notes (Signed)
Physical Therapy Treatment Patient Details Name: Bridget Joyce MRN: [id number] DOB: 07/30/1955 Today's Date: 04/19/2020    History of Present Illness Pt is a 64 year old female s/p L THA direct anterior approach    PT Comments    Progressing with mobility. Per chart and pt report, pt has now decided to d/c home. Reviewed/practiced exercises and gait training. Pt stated she does not have any stairs to negotiate. All education completed.    Follow Up Recommendations   (Plan is now for home with HHPT. Pt has changed her mind and prefers to d/c home instead of SNF.)     Equipment Recommendations  Rolling walker with 5" wheels    Recommendations for Other Services       Precautions / Restrictions Precautions Precautions: Fall Restrictions Weight Bearing Restrictions: No LLE Weight Bearing: Weight bearing as tolerated    Mobility  Bed Mobility               General bed mobility comments: in recliner. Pt stated bed mobility has improved.  Transfers Overall transfer level: Needs assistance Equipment used: Rolling walker (2 wheeled) Transfers: Sit to/from Stand Sit to Stand: Modified independent (Device/Increase time)            Ambulation/Gait Ambulation/Gait assistance: Supervision Gait Distance (Feet): 200 Feet Assistive device: Rolling walker (2 wheeled) Gait Pattern/deviations: Step-through pattern;Decreased stride length     General Gait Details: Supervision for safety. Cues for posture, pacing, equal step lengths and stance time on bil LEs. Pt tends to move quickly and with decreased control at times   Stairs Stairs:  (pt stated she does not have any stairs)           Wheelchair Mobility    Modified Rankin (Stroke Patients Only)       Balance Overall balance assessment: Needs assistance         Standing balance support: Bilateral upper extremity supported Standing balance-Leahy Scale: Fair                               Cognition Arousal/Alertness: Awake/alert Behavior During Therapy: WFL for tasks assessed/performed Overall Cognitive Status: Within Functional Limits for tasks assessed                                        Exercises Total Joint Exercises Hip ABduction/ADduction: AROM;Left;10 reps;Standing Knee Flexion: AROM;Left;10 reps;Standing Marching in Standing: AROM;Both;10 reps;Standing General Exercises - Lower Extremity Heel Raises: Both;10 reps;Standing;AROM    General Comments        Pertinent Vitals/Pain Pain Assessment: Faces Faces Pain Scale: Hurts little more Pain Location: L hip/thigh Pain Descriptors / Indicators: Discomfort;Sore;Aching;Tightness Pain Intervention(s): Limited activity within patient's tolerance;Monitored during session    Home Living                      Prior Function            PT Goals (current goals can now be found in the care plan section) Progress towards PT goals: Progressing toward goals    Frequency    7X/week      PT Plan Current plan remains appropriate    Co-evaluation              AM-PAC PT "6 Clicks" Mobility   Outcome Measure  Help needed turning from your  back to your side while in a flat bed without using bedrails?: A Little Help needed moving from lying on your back to sitting on the side of a flat bed without using bedrails?: A Little Help needed moving to and from a bed to a chair (including a wheelchair)?: A Little Help needed standing up from a chair using your arms (e.g., wheelchair or bedside chair)?: A Little Help needed to walk in hospital room?: A Little Help needed climbing 3-5 steps with a railing? : A Little 6 Click Score: 18    End of Session Equipment Utilized During Treatment: Gait belt Activity Tolerance: Patient tolerated treatment well Patient left: in chair;with call bell/phone within reach   PT Visit Diagnosis: Other abnormalities of gait and mobility (R26.89) Pain  - Right/Left: Left Pain - part of body: Hip     Time: 1000-1010 PT Time Calculation (min) (ACUTE ONLY): 10 min  Charges:  $Gait Training: 8-22 mins                         Doreatha Massed, PT Acute Rehabilitation  Office: [phone] Pager: [phone]

## 2020-04-19 NOTE — Telephone Encounter (Signed)
I'll call and go over some things with her.

## 2020-04-19 NOTE — TOC Transition Note (Signed)
Transition of Care [facility]) - CM/SW Discharge Note   Patient Details  Name: Bridget Joyce MRN: [id number] Date of Birth: September 30, 1955  Transition of Care [facility]) CM/SW Contact:  Lennart Pall, LCSW Phone Number: 04/19/2020, 9:25 AM   Clinical Narrative:    Met with pt this morning - she now states that she plans to dc home and declines SNF (had finally received ins auth end of the day yesterday).  She has already received her DME (in her room) and I have alerted [facility] of change in plan back to home.  They will follow up with her for HHPT.  No further TOC needs.   Final next level of care: [place] Barriers to Discharge: Barriers Resolved   Patient Goals and CMS Choice Patient states their goals for this hospitalization and ongoing recovery are:: return home      Discharge Placement                       Discharge Plan and Services                DME Arranged: 3-N-1,Walker rolling DME Agency: [employer] Date DME Agency Contacted: 04/13/20 Time DME Agency Contacted: 2119 Representative spoke with at DME Agency: Ovid Curd HH Arranged: PT [place]: [facility] [employer] Date [place]: 04/19/20 Time [place]: [phone] Representative spoke with at [place]: Ronalee Belts - alerted that plan now changed back to home dc  Social Determinants of Health (SDOH) Interventions     Readmission Risk Interventions Readmission Risk Prevention Plan 04/13/2020  Post Dischage Appt Complete  Medication Screening Complete  Transportation Screening Complete  Some recent data might be hidden

## 2020-04-19 NOTE — Telephone Encounter (Signed)
Patient called asked if she can bend over to scoop cat litter? Patient also asked how long does she need to wear the ted hose? Patient asked if she should leave the dressing on until her next visit.  Patient asked how long should she use the walker?  The number to contact patient is [phone]

## 2020-04-19 NOTE — Plan of Care (Signed)
  Problem: Education: Goal: Knowledge of the prescribed therapeutic regimen will improve Outcome: Adequate for Discharge Goal: Understanding of discharge needs will improve Outcome: Adequate for Discharge   Problem: Clinical Measurements: Goal: Postoperative complications will be avoided or minimized Outcome: Adequate for Discharge   Problem: Skin Integrity: Goal: Will show signs of wound healing Outcome: Adequate for Discharge   Problem: Acute Rehab PT Goals(only PT should resolve) Goal: Pt Will Go Supine/Side To Sit Outcome: Adequate for Discharge Goal: Patient Will Transfer Sit To/From Stand Outcome: Adequate for Discharge Goal: Pt Will Ambulate Outcome: Adequate for Discharge Goal: Pt/caregiver will Perform Home Exercise Program Outcome: Adequate for Discharge   Problem: Education: Goal: Knowledge of General Education information will improve Description: Including pain rating scale, medication(s)/side effects and non-pharmacologic comfort measures Outcome: Adequate for Discharge   Problem: Health Behavior/Discharge Planning: Goal: Ability to manage health-related needs will improve Outcome: Adequate for Discharge   Problem: Clinical Measurements: Goal: Ability to maintain clinical measurements within normal limits will improve Outcome: Adequate for Discharge Goal: Will remain free from infection Outcome: Adequate for Discharge Goal: Diagnostic test results will improve Outcome: Adequate for Discharge   Problem: Safety: Goal: Ability to remain free from injury will improve Outcome: Adequate for Discharge   

## 2020-04-19 NOTE — Plan of Care (Signed)
  Problem: Education: Goal: Knowledge of the prescribed therapeutic regimen will improve Outcome: Adequate for Discharge Goal: Understanding of discharge needs will improve Outcome: Adequate for Discharge   Problem: Clinical Measurements: Goal: Postoperative complications will be avoided or minimized Outcome: Adequate for Discharge   Problem: Skin Integrity: Goal: Will show signs of wound healing Outcome: Adequate for Discharge   Problem: Acute Rehab PT Goals(only PT should resolve) Goal: Pt Will Go Supine/Side To Sit Outcome: Adequate for Discharge Goal: Patient Will Transfer Sit To/From Stand Outcome: Adequate for Discharge Goal: Pt Will Ambulate Outcome: Adequate for Discharge Goal: Pt/caregiver will Perform Home Exercise Program Outcome: Adequate for Discharge   Problem: Education: Goal: Knowledge of General Education information will improve Description: Including pain rating scale, medication(s)/side effects and non-pharmacologic comfort measures Outcome: Adequate for Discharge   Problem: Health Behavior/Discharge Planning: Goal: Ability to manage health-related needs will improve Outcome: Adequate for Discharge   Problem: Clinical Measurements: Goal: Ability to maintain clinical measurements within normal limits will improve Outcome: Adequate for Discharge Goal: Will remain free from infection Outcome: Adequate for Discharge Goal: Diagnostic test results will improve Outcome: Adequate for Discharge   Problem: Safety: Goal: Ability to remain free from injury will improve Outcome: Adequate for Discharge

## 2020-04-19 NOTE — Discharge Summary (Signed)
Patient ID: Bridget Joyce MRN: [id number] DOB/AGE: 12-06-1955 64 y.o.  Admit date: 04/12/2020 Discharge date: 04/19/2020  Admission Diagnoses:  Principal Problem:   Unilateral primary osteoarthritis, left hip Active Problems:   Status post total replacement of left hip   Discharge Diagnoses:  Same  Past Medical History:  Diagnosis Date  . Allergy    rag weed, pollen,dust  . Ankle fracture 12/2017   Left   . Anxiety    mild; infrequent per patient  . Asthma   . Bursitis of hip   . Diverticulosis   . External hemorrhoids   . Fibroid   . Fibromyalgia   . GERD (gastroesophageal reflux disease)   . Migraine    Ocular  . Obesity   . Peripheral edema    infrequent per patient  . Pre-diabetes   . Vitamin D deficiency     Surgeries: Procedure(s): LEFT TOTAL HIP ARTHROPLASTY ANTERIOR APPROACH on 04/12/2020   Consultants:   Discharged Condition: Improved  Hospital Course: Bridget Joyce is an 64 y.o. female who was admitted 04/12/2020 for operative treatment ofUnilateral primary osteoarthritis, left hip. Patient has severe unremitting pain that affects sleep, daily activities, and work/hobbies. After pre-op clearance the patient was taken to the operating room on 04/12/2020 and underwent  Procedure(s): LEFT TOTAL HIP ARTHROPLASTY ANTERIOR APPROACH.    Patient was given perioperative antibiotics:  Anti-infectives (From admission, onward)   Start     Dose/Rate Route Frequency Ordered Stop   04/12/20 2000  ceFAZolin (ANCEF) IVPB 1 g/50 mL premix        1 g 100 mL/hr over 30 Minutes Intravenous Every 6 hours 04/12/20 1519 04/13/20 0648   04/12/20 1030  ceFAZolin (ANCEF) IVPB 2g/100 mL premix        2 g 200 mL/hr over 30 Minutes Intravenous On call to O.R. 04/12/20 1024 04/12/20 1330       Patient was given sequential compression devices, early ambulation, and chemoprophylaxis to prevent DVT.  Patient benefited maximally from hospital stay and there were no  complications.    Recent vital signs:  Patient Vitals for the past 24 hrs:  BP Temp Temp src Pulse Resp SpO2  04/19/20 0656 124/62 97.7 F (36.5 C) Oral 73 16 98 %  04/18/20 2116 138/84 97.7 F (36.5 C) Oral 86 17 100 %  04/18/20 1413 130/86 98.4 F (36.9 C) Oral 70 20 100 %     Recent laboratory studies: No results for input(s): WBC, HGB, HCT, PLT, NA, K, CL, CO2, BUN, CREATININE, GLUCOSE, INR, CALCIUM in the last 72 hours.  Invalid input(s): PT, 2   Discharge Medications:   Allergies as of 04/19/2020      Reactions   [place] Grass Extract Itching   Grass Pollen(k-o-r-t-swt Vern)    Mold Extract  [trichophyton] Itching   Molds & Smuts    Other Other (See Comments)   Dust mite/ unknown   Short Ragweed Pollen Ext       Medication List    STOP taking these medications   cyclobenzaprine 10 MG tablet Commonly known as: FLEXERIL   meloxicam 15 MG tablet Commonly known as: MOBIC     TAKE these medications   aspirin 81 MG chewable tablet Chew 1 tablet (81 mg total) by mouth 2 (two) times daily.   beclomethasone 80 MCG/ACT inhaler Commonly known as: QVAR Inhale 1 puff into the lungs daily.   busPIRone 15 MG tablet Commonly known as: BUSPAR Take 1 tablet (15 mg total) by  mouth 2 (two) times daily. What changed: when to take this   CALCIUM PO Take 600 mg by mouth daily. Citracal   fluticasone 50 MCG/ACT nasal spray Commonly known as: FLONASE Place 1 spray into both nostrils daily.   Magnesium 400 MG Caps Take 400 mg by mouth daily.   methocarbamol 500 MG tablet Commonly known as: ROBAXIN Take 1 tablet (500 mg total) by mouth every 6 (six) hours as needed for muscle spasms.   multivitamin tablet Take 1 tablet by mouth daily.   olopatadine 0.1 % ophthalmic solution Commonly known as: PATANOL Place 1 drop into both eyes daily.   oxyCODONE 5 MG immediate release tablet Commonly known as: Oxy IR/ROXICODONE Take 1-2 tablets (5-10 mg total) by mouth every 4  (four) hours as needed for moderate pain (pain score 4-6).   VITAMIN C PO Take 1,000 mg by mouth daily.   Vitamin D (Ergocalciferol) 1.25 MG (50000 UNIT) Caps capsule Commonly known as: DRISDOL Take 1 capsule (50,000 Units total) by mouth every 7 (seven) days.   zinc gluconate 50 MG tablet Take 50 mg by mouth daily.            Durable Medical Equipment  (From admission, onward)         Start     Ordered   04/12/20 1652  DME 3 n 1  Once        04/12/20 1651   04/12/20 1652  DME Walker rolling  Once       Question Answer Comment  Walker: With 5 Inch Wheels   Patient needs a walker to treat with the following condition Status post total replacement of left hip      04/12/20 1651          Diagnostic Studies: DG C-Arm 1-60 Min-No Report  Result Date: 04/12/2020 Fluoroscopy was utilized by the requesting physician.  No radiographic interpretation.   DG HIP OPERATIVE UNILAT W OR W/O PELVIS LEFT  Result Date: 04/12/2020 CLINICAL DATA:  Provided history: Status post left total hip arthroplasty performed in OR. Provided fluoroscopy time: 15 seconds (3.3249 mGy). EXAM: OPERATIVE left HIP (WITH PELVIS IF PERFORMED) 4 VIEWS TECHNIQUE: Fluoroscopic spot image(s) were submitted for interpretation post-operatively. COMPARISON:  MRI of the left hip 01/19/2020. FINDINGS: Four intraoperative fluoroscopic images of the left hip are submitted. There has been interval left total hip arthroplasty. The femoral and acetabular components appear well seated. No unexpected finding. IMPRESSION: Four intraoperative fluoroscopic images of the left hip, as described. Electronically Signed   By: Kellie Simmering DO   On: 04/12/2020 15:05    Disposition: Discharge disposition: 03-Skilled [place] information for follow-up providers    Mcarthur Rossetti, MD Follow up in 2 week(s).   Specialty: Orthopedic Surgery Contact information: [place]  [address] [phone]        [facility] up.   Specialty: [place] Why: to provide home health physical therapy Contact information: [place] [address] [phone]            Contact information for after-discharge care    Destination    [facility] Preferred SNF .   Service: Skilled Nursing Contact information: [address] [place] [phone]                   Signed: Mcarthur Rossetti 04/19/2020,  6:57 AM

## 2020-04-19 NOTE — Telephone Encounter (Signed)
Patient called earlier in the day asking questions about post-op instructions after being discharged from hospital today. Reviewed all post-op care instructions and patient was agreeable to being home and anticipating HHPT coming out to see her this week.

## 2020-04-21 ENCOUNTER — Other Ambulatory Visit: Payer: Self-pay | Admitting: Family Medicine

## 2020-04-25 ENCOUNTER — Telehealth: Payer: Self-pay | Admitting: *Deleted

## 2020-04-25 NOTE — Telephone Encounter (Signed)
RNCM received call from patient today stating that she was supposed to receive a visit today with [facility] for HHPT and would like to cancel. Patient described several incidents at beginning of [facility] services in which she could not be reached and the staff called her contacts, which she was frustrated with. Discussed that she seems to be doing extremely well with her at home rehab and may not need further HHPT. Appt with MD this week on Wednesday and would recommend discussing with him to see if further therapy is needed. She requested being discharged from [facility] services at this time. CM let therapy liaison know of conversation. Dorene Sorrow with [facility] has contacted patient and will proceed with discharging from services at this time. Will also email patient a written copy of their home exercises for her records.

## 2020-04-27 ENCOUNTER — Encounter: Payer: Self-pay | Admitting: Orthopaedic Surgery

## 2020-04-27 ENCOUNTER — Inpatient Hospital Stay: Payer: BC Managed Care – PPO | Admitting: Orthopaedic Surgery

## 2020-04-27 ENCOUNTER — Ambulatory Visit (INDEPENDENT_AMBULATORY_CARE_PROVIDER_SITE_OTHER): Payer: BC Managed Care – PPO | Admitting: Orthopaedic Surgery

## 2020-04-27 DIAGNOSIS — Z96642 Presence of left artificial hip joint: Secondary | ICD-10-CM

## 2020-04-27 NOTE — Progress Notes (Signed)
The patient is 2 weeks status post a left total hip arthroplasty.  She states she is doing well overall.  She is not taking any pain but still taking a muscle relaxant.  She is walking with 1 crutch.  She says that she has a little pain around the incision in the groin with walking but overall she is getting more satisfied with how she is doing postoperatively.  Examination of her left hip incision shows that it looks good.  The staples have been removed and Steri-Strips applied.  There is no evidence of breakdown and no significant seroma.  She will continue to increase her activities as she tolerates.  I would like to see her back in 4 weeks to see how she is doing overall from mobility standpoint.  She does report that in spite of her leg length discrepancy, her muscle spasms are less and this may be helping her posture and this may settle down a little bit more for her in terms of her feeling more balanced.  This makes me please as well.

## 2020-05-11 ENCOUNTER — Other Ambulatory Visit: Payer: Self-pay | Admitting: Orthopaedic Surgery

## 2020-05-11 NOTE — Telephone Encounter (Signed)
04/12/20 L THA

## 2020-05-23 ENCOUNTER — Other Ambulatory Visit: Payer: Self-pay

## 2020-05-23 ENCOUNTER — Telehealth: Payer: Self-pay | Admitting: Orthopaedic Surgery

## 2020-05-23 MED ORDER — AMOXICILLIN 500 MG PO TABS: ORAL_TABLET | ORAL | 0 refills | Status: DC

## 2020-05-23 NOTE — Telephone Encounter (Signed)
Pt called and she is having a dental surgery and needs you to give her a call because she has a few questions. [phone]

## 2020-05-23 NOTE — Telephone Encounter (Signed)
Antibiotics called into pharmacy, [facility] for patient letting her know I did so

## 2020-05-25 ENCOUNTER — Encounter: Payer: Self-pay | Admitting: Orthopaedic Surgery

## 2020-05-25 ENCOUNTER — Ambulatory Visit (INDEPENDENT_AMBULATORY_CARE_PROVIDER_SITE_OTHER): Payer: 59 | Admitting: Orthopaedic Surgery

## 2020-05-25 DIAGNOSIS — Z96642 Presence of left artificial hip joint: Secondary | ICD-10-CM

## 2020-05-25 NOTE — Progress Notes (Signed)
The patient is now 6 weeks status post a left total hip arthroplasty.  She is actually ahead of what most people are at this stage.  Mobility is excellent.  There is a leg length discrepancy that she had before surgery as well.  Her left leg is longer than the right side and the left is the operative side.  We cannot shorten her.  This caused her to walk with a slight limp to her gait.  I am very surprised that her mobility though.  She can actually flex and extend her back incredibly well.  She has some stiffness to be expected in some pain but overall she is ahead of what most people are.  I did answer all of her questions for her.  I put her hip through range of motion looks good.  There is a small scab near her incision but it looks like it is healing nicely and is not worrisome.  We will see her back in 6 months with a standing low AP pelvis and a lateral of her left operative hip.  If there is issues before then she will let us know.

## 2020-06-02 ENCOUNTER — Telehealth: Payer: Self-pay | Admitting: Orthopaedic Surgery

## 2020-06-02 NOTE — Telephone Encounter (Signed)
Received medical records release form from patient  

## 2020-06-02 NOTE — Telephone Encounter (Signed)
Patient requesting CD of images. Please call when ready. (Signed release received)

## 2020-06-03 NOTE — Telephone Encounter (Signed)
Left voicemail advising patient CD was ready for pickup ?

## 2020-06-16 NOTE — Patient Instructions (Addendum)
Health Maintenance Due  Topic Date Due  . PAP SMEAR-Sign release of information at the check out desk for records from gynecologist 02/01/2020   Sign release of information at the check out desk for last 2 years of records from Dr. Clovis Pu or he can simply continue these.   Please call [phone] to schedule a visit with [facility] if you would like to help with stress management -Trey Paula is an excellent counselor who is based out of our clinic  Consider book atomic habits  Schedule a lab visit after march 15th, 2022. Return for future fasting labs meaning nothing but water after midnight please. Ok to take your medications with water  Recommended follow up: Return in about 6 months (around 12/15/2020) for physical or sooner if needed.

## 2020-06-16 NOTE — Progress Notes (Signed)
Phone: [phone]   Subjective:  Patient presents today to establish care.  Prior patient of Dr. Marily Memos with [facility] and Dr. Unk Pinto.  Chief Complaint  Patient presents with  . New Patient (Initial Visit)   See problem oriented charting  The following were reviewed and entered/updated in epic: Past Medical History:  Diagnosis Date  . Allergy    rag weed, pollen,dust  . Ankle fracture 12/2017   Left   . Anxiety    mild; infrequent per patient  . Asthma   . Bursitis of hip   . Diverticulosis   . External hemorrhoids   . Fibroid   . Fibromyalgia   . GERD (gastroesophageal reflux disease)   . Migraine    Ocular  . Obesity   . Peripheral edema    infrequent per patient  . Pre-diabetes   . Vitamin D deficiency    Patient Active Problem List   Diagnosis Date Noted  . Status post total replacement of left hip 04/12/2020    Priority: Medium  . Fibromyalgia 10/29/2019    Priority: Medium  . GAD (generalized anxiety disorder) 04/14/2018    Priority: Medium  . Mixed hyperlipidemia 07/13/2014    Priority: Medium  . Mild persistent asthma   12/19/2013    Priority: Medium  . Prediabetes     Priority: Medium  . Primary osteoarthritis of right knee 01/25/2017    Priority: Low  . Vitamin D deficiency     Priority: Low  . Myopia 10/28/2012    Priority: Low  . GERD 05/26/2008    Priority: Low  . Allergic rhinitis 05/20/2008    Priority: Low  . Diverticulosis 09/02/2006    Priority: Low   Past Surgical History:  Procedure Laterality Date  . ABDOMINAL HYSTERECTOMY  2002   ovaries, cervix  remain  . BLEPHAROPLASTY Bilateral   . COLONOSCOPY  09/02/2006   external hemorrhoids,diverticulosis  . EYE SURGERY Left    adult stabismus  . KNEE SURGERY     Left arthroscopic  . LIPOMA EXCISION     Labial  . MANDIBLE SURGERY    . REPAIR ANKLE LIGAMENT Right 04/05/2014   x2  . TONSILLECTOMY    . TOTAL HIP ARTHROPLASTY Left 04/12/2020   Procedure: LEFT TOTAL HIP  ARTHROPLASTY ANTERIOR APPROACH;  Surgeon: Mcarthur Rossetti, MD;  Location: [facility];  Service: Orthopedics;  Laterality: Left;    Family History  Problem Relation Age of Onset  . Hypertension Father   . Heart disease Father   . Diabetes Father   . Heart failure Father        58 death  . Uterine cancer Sister   . Diabetes Sister   . Stroke Mother        26 death- hemorrhagic  . Healthy Brother        other than smoker  . Hypertension Sister   . Colon cancer Neg Hx   . Esophageal cancer Neg Hx   . Stomach cancer Neg Hx   . Rectal cancer Neg Hx     Medications- reviewed and updated Current Outpatient Medications  Medication Sig Dispense Refill  . amoxicillin (AMOXIL) 500 MG tablet Take two tabs by mouth ONE hour before dental appointment, then two tabs SIX hours after 8 tablet 0  . Ascorbic Acid (VITAMIN C PO) Take 1,000 mg by mouth daily.     . busPIRone (BUSPAR) 15 MG tablet Take 1 tablet (15 mg total) by mouth 2 (two) times daily. (Patient taking  differently: Take 15 mg by mouth at bedtime.) 180 tablet 1  . CALCIUM PO Take 600 mg by mouth daily. Citracal    . cyclobenzaprine (FLEXERIL) 5 MG tablet Take 1 tablet (5 mg total) by mouth at bedtime as needed for muscle spasms. 30 tablet 2  . fluticasone (FLONASE) 50 MCG/ACT nasal spray Place 1 spray into both nostrils daily.    . Magnesium 400 MG CAPS Take 400 mg by mouth daily.     . Multiple Vitamin (MULTIVITAMIN) tablet Take 1 tablet by mouth daily.    Marland Kitchen zinc gluconate 50 MG tablet Take 50 mg by mouth daily.    . beclomethasone (QVAR) 80 MCG/ACT inhaler Inhale 1 puff into the lungs daily. 3 each 3   No current facility-administered medications for this visit.    Allergies-reviewed and updated Allergies  Allergen Reactions  . [place] Grass Extract Itching  . Grass Pollen(K-O-R-T-Swt Vern)   . Mold Extract  [Trichophyton] Itching  . Molds & Smuts   . Other Other (See Comments)    Dust mite/ unknown  . Short Ragweed  Pollen Ext     Social History   Social History Narrative   Lives alone with 2 cats. Never Married. No kids.       Works for [employer] for 29 years in 2022.       Hobbies: time with friends, enjoy [employer] (has been there since she was a child- perhaps 6 months), time with family- 2 sisters and a brother      Advanced directives: full code, has Living will, HCPOA- older sister Alwyn Pea (on file after hip replacement)    Objective  Objective:  BP 126/82   Pulse 83   Temp (!) 97.3 F (36.3 C) (Temporal)   Ht 5' 4.5" (1.638 m)   Wt 221 lb 6.4 oz (100.4 kg)   LMP 10/14/2000   SpO2 98%   BMI 37.42 kg/m  Gen: NAD, resting comfortably HEENT: Mucous membranes are moist. Oropharynx normal. TM normal- appears to have some scarring of bilateral TMs- canal mildly dry (discussed mineral oil ). Eyes: sclera and lids normal, PERRLA Neck: no thyromegaly, no cervical lymphadenopathy CV: RRR no murmurs rubs or gallops Lungs: CTAB no crackles, wheeze, rhonchi Abdomen: soft/nontender/nondistended/normal bowel sounds. No rebound or guarding.  Ext: no edema Skin: warm, dry Neuro: grossly normal strength, normal gait, normal reflexes    Assessment and Plan:   #Vitamin D deficiency S: Medication:  5000 units every 2-3 days . Has required 50k units in past.  Last vitamin D Lab Results  Component Value Date   VD25OH 43 03/07/2016  A/P: hopefully stable- update vitamin D today. Continue current meds    # Hyperglycemia/insulin resistance/prediabetes- peak a1c of 6.1  S:  Medication: none Exercise and diet- enjoys some junk food nightly and also does some sodas perhaps twice a day Lab Results  Component Value Date   HGBA1C 6.1 (H) 04/12/2020   HGBA1C 5.8 (H) 03/07/2016   HGBA1C 6.1 (H) 11/12/2014   A/P: discussed prediabetes diagnosis. Extended counseling about lifestyle changes, habit formation, reducing sugar intake particularly with average of what sounds like 3 sodas per day-  I really think she can bring level downsignificantly- wants to come back perhaps in 1-2 months for labs then see me in 6 months for CPE  #hyperlipidemia S: Medication: none  Lab Results  Component Value Date   CHOL 206 (H) 03/07/2016   HDL 71 03/07/2016   LDLCALC 119 (H) 03/07/2016  TRIG 82 03/07/2016   CHOLHDL 2.9 03/07/2016   A/P: poor control on last check - update levels with labs. Likely has space to work on lifestyle. Encouraged need for healthy eating, regular exercise, weight loss.   # Anxiety- a lot of work stress S:Medication: buspirone 15 mg at night helpful -- follows with Dr. Clovis Pu but I am willing to prescribe if needed.  -uses sparing alprazolam. 60 tablets rx April 2021.  Counseling: none A/P: reasonable control with sparing use of buspirone- continue current meds  (id be willing to fill this -in the future would be willing to prescribe alprazolam once we get records from Dr. Clovis Pu.   # Fibromyalgia S: Patient with history of feeling fatigued in her legs with exercise.  Has been told this could be fibromyalgia. Reassuring blood flow tests in past including ABI and angiogram.  A/P: stable without medicine- continue to monitor   # Asthma S: Maintenance Medication: qvar 80 mcg just once daily As needed medication: albuterol. Patient is using this sparingly per week. - usually only pre exercise (has not been exercising much lately) A/P: Stable. Continue current medications. Usually id prefer BID AVar but with control did not opt to change   # muscle spasms in low back and lateral hips at times- flexeril helps- requests refill today which was provided for sparing use before bed.   # Obesity morbid with BMI >35 and hyperlipidemia, prediabetes, OA  S:slightly down from last check  Wt Readings from Last 3 Encounters:  06/17/20 221 lb 6.4 oz (100.4 kg)  04/12/20 226 lb (102.5 kg)  03/21/20 227 lb (103 kg)  A/P: discussed if gets BMI under 35 can remove morbid obesity  diagnosis. Has a lot of room for improvement- sodas were a big tager today  -Encouraged need for healthy eating, regular exercise, weight loss.    Recommended follow up: Return in about 6 months (around 12/15/2020) for physical or sooner if needed. 6 months after labs Future Appointments  Date Time Provider [place]  06/30/2020  1:00 PM Cottle, Billey Co., MD [facility] None  09/23/2020 11:30 AM [facility]  11/23/2020  3:30 PM Mcarthur Rossetti, MD [facility] None    Meds ordered this encounter  Medications  . beclomethasone (QVAR) 80 MCG/ACT inhaler    Sig: Inhale 1 puff into the lungs daily.    Dispense:  3 each    Refill:  3  . cyclobenzaprine (FLEXERIL) 5 MG tablet    Sig: Take 1 tablet (5 mg total) by mouth at bedtime as needed for muscle spasms.    Dispense:  30 tablet    Refill:  2    Time Spent: 65 minutes of total time (2:51 PM- 3:46 PM, 7:00 PM- 7:10 PM) was spent on the date of the encounter performing the following actions: chart review prior to seeing the patient, obtaining history, performing a medically necessary exam, counseling on the treatment plan, placing orders, and documenting in our EHR.   Return precautions advised. Garret Reddish, MD

## 2020-06-17 ENCOUNTER — Encounter: Payer: Self-pay | Admitting: Family Medicine

## 2020-06-17 ENCOUNTER — Ambulatory Visit: Payer: 59 | Admitting: Family Medicine

## 2020-06-17 ENCOUNTER — Other Ambulatory Visit: Payer: Self-pay

## 2020-06-17 VITALS — BP 126/82 | HR 83 | Temp 97.3°F | Ht 64.5 in | Wt 221.4 lb

## 2020-06-17 DIAGNOSIS — Z1159 Encounter for screening for other viral diseases: Secondary | ICD-10-CM

## 2020-06-17 DIAGNOSIS — R7303 Prediabetes: Secondary | ICD-10-CM

## 2020-06-17 DIAGNOSIS — F411 Generalized anxiety disorder: Secondary | ICD-10-CM

## 2020-06-17 DIAGNOSIS — E559 Vitamin D deficiency, unspecified: Secondary | ICD-10-CM | POA: Diagnosis not present

## 2020-06-17 DIAGNOSIS — M797 Fibromyalgia: Secondary | ICD-10-CM

## 2020-06-17 DIAGNOSIS — E785 Hyperlipidemia, unspecified: Secondary | ICD-10-CM | POA: Diagnosis not present

## 2020-06-17 DIAGNOSIS — R739 Hyperglycemia, unspecified: Secondary | ICD-10-CM | POA: Diagnosis not present

## 2020-06-17 DIAGNOSIS — J4530 Mild persistent asthma, uncomplicated: Secondary | ICD-10-CM

## 2020-06-17 DIAGNOSIS — Z114 Encounter for screening for human immunodeficiency virus [HIV]: Secondary | ICD-10-CM

## 2020-06-17 MED ORDER — BECLOMETHASONE DIPROPIONATE 80 MCG/ACT IN AERS: 1.0000 | INHALATION_SPRAY | Freq: Every day | RESPIRATORY_TRACT | 3 refills | Status: DC

## 2020-06-17 MED ORDER — CYCLOBENZAPRINE HCL 5 MG PO TABS: 5.0000 mg | ORAL_TABLET | Freq: Every evening | ORAL | 2 refills | Status: DC | PRN

## 2020-06-17 NOTE — Assessment & Plan Note (Signed)
S:Medication: buspirone 15 mg at night helpful -- follows with Dr. Clovis Pu but I am willing to prescribe if needed.  -uses sparing alprazolam. 60 tablets rx April 2021.  Counseling: none A/P: reasonable control with sparing use of buspirone- continue current meds  (id be willing to fill this -in the future would be willing to prescribe alprazolam once we get records from Dr. Clovis Pu.

## 2020-06-17 NOTE — Assessment & Plan Note (Signed)
S: Medication: none  Lab Results  Component Value Date   CHOL 206 (H) 03/07/2016   HDL 71 03/07/2016   LDLCALC 119 (H) 03/07/2016   TRIG 82 03/07/2016   CHOLHDL 2.9 03/07/2016   A/P: poor control on last check - update levels with labs. Likely has space to work on lifestyle. Encouraged need for healthy eating, regular exercise, weight loss.

## 2020-06-17 NOTE — Assessment & Plan Note (Signed)
S:  Medication: none Exercise and diet- enjoys some junk food nightly and also does some sodas perhaps twice a day Lab Results  Component Value Date   HGBA1C 6.1 (H) 04/12/2020   HGBA1C 5.8 (H) 03/07/2016   HGBA1C 6.1 (H) 11/12/2014   A/P: discussed prediabetes diagnosis. Extended counseling about lifestyle changes, habit formation, reducing sugar intake particularly with average of what sounds like 3 sodas per day- I really think she can bring level downsignificantly- wants to come back perhaps in 1-2 months for labs then see me in 6 months for CPE

## 2020-06-18 ENCOUNTER — Encounter (HOSPITAL_BASED_OUTPATIENT_CLINIC_OR_DEPARTMENT_OTHER): Payer: Self-pay

## 2020-06-20 ENCOUNTER — Telehealth (HOSPITAL_BASED_OUTPATIENT_CLINIC_OR_DEPARTMENT_OTHER): Payer: Self-pay | Admitting: Obstetrics & Gynecology

## 2020-06-20 NOTE — Telephone Encounter (Signed)
Called patient and left message to please call the office to scheulde appointment with Dr.Miller.

## 2020-06-28 ENCOUNTER — Ambulatory Visit: Payer: BC Managed Care – PPO | Admitting: Psychiatry

## 2020-06-30 ENCOUNTER — Encounter: Payer: BC Managed Care – PPO | Admitting: Psychiatry

## 2020-06-30 NOTE — Progress Notes (Signed)
  This encounter was created in error - please disregard. Pt NS

## 2020-07-15 ENCOUNTER — Ambulatory Visit: Payer: BC Managed Care – PPO

## 2020-07-28 ENCOUNTER — Ambulatory Visit (INDEPENDENT_AMBULATORY_CARE_PROVIDER_SITE_OTHER): Payer: 59 | Admitting: Obstetrics & Gynecology

## 2020-07-28 ENCOUNTER — Encounter (HOSPITAL_BASED_OUTPATIENT_CLINIC_OR_DEPARTMENT_OTHER): Payer: Self-pay

## 2020-07-28 ENCOUNTER — Encounter (HOSPITAL_BASED_OUTPATIENT_CLINIC_OR_DEPARTMENT_OTHER): Payer: Self-pay | Admitting: Obstetrics & Gynecology

## 2020-07-28 ENCOUNTER — Other Ambulatory Visit: Payer: Self-pay | Admitting: Obstetrics & Gynecology

## 2020-07-28 ENCOUNTER — Other Ambulatory Visit (HOSPITAL_COMMUNITY)
Admission: RE | Admit: 2020-07-28 | Discharge: 2020-07-28 | Disposition: A | Discharge status: Home / Self Care | Payer: 59 | Source: Ambulatory Visit | Attending: Obstetrics & Gynecology | Admitting: Obstetrics & Gynecology

## 2020-07-28 ENCOUNTER — Other Ambulatory Visit: Payer: Self-pay

## 2020-07-28 VITALS — BP 122/80 | Ht 65.0 in | Wt 224.0 lb

## 2020-07-28 DIAGNOSIS — Z90710 Acquired absence of both cervix and uterus: Secondary | ICD-10-CM | POA: Diagnosis not present

## 2020-07-28 DIAGNOSIS — Z780 Asymptomatic menopausal state: Secondary | ICD-10-CM | POA: Diagnosis not present

## 2020-07-28 DIAGNOSIS — Z124 Encounter for screening for malignant neoplasm of cervix: Secondary | ICD-10-CM | POA: Diagnosis not present

## 2020-07-28 DIAGNOSIS — Z01419 Encounter for gynecological examination (general) (routine) without abnormal findings: Secondary | ICD-10-CM | POA: Diagnosis not present

## 2020-07-28 DIAGNOSIS — Z1231 Encounter for screening mammogram for malignant neoplasm of breast: Secondary | ICD-10-CM

## 2020-07-28 NOTE — Progress Notes (Addendum)
65 y.o. G0P0 Single White or Caucasian female here for annual exam.  Still working and has plan to retired in <3 years.  Had hip replacement in December.  Doing really well.    Denies vaginal bleeding.  PCP:  Dr. Yong Channel,   Patient's last menstrual period was 10/14/2000.          Sexually active: No.  The current method of family planning is status post hysterectomy.    Exercising: No.   Smoker:  No  Health Maintenance: Pap:  2018 History of abnormal Pap:  no MMG:  06/03/2019 Colonoscopy:  2013, Dr Carlean Purl, follow up 10 year BMD:   2018, normal.  Repeat next year. TDaP:  01/2013 Pneumonia vaccine(s):  Will start next year Shingrix:   completed Hep C testing: ordered with Dr. Yong Channel in May Screening Labs: planned in May   reports that she has never smoked. She has never used smokeless tobacco. She reports that she does not drink alcohol and does not use drugs.  Past Medical History:  Diagnosis Date   Allergy    rag weed, pollen,dust   Ankle fracture 12/2017   Left    Anxiety    mild; infrequent per patient   Asthma    Bursitis of hip    Diverticulosis    External hemorrhoids    Fibroid    Fibromyalgia    GERD (gastroesophageal reflux disease)    Migraine    Ocular   Obesity    Peripheral edema    infrequent per patient   Pre-diabetes    Vitamin D deficiency     Past Surgical History:  Procedure Laterality Date   ABDOMINAL HYSTERECTOMY  2002   ovaries, cervix  remain   BLEPHAROPLASTY Bilateral    COLONOSCOPY  09/02/2006   external hemorrhoids,diverticulosis   EYE SURGERY Left    adult stabismus   KNEE SURGERY     Left arthroscopic   LIPOMA EXCISION     Labial   MANDIBLE SURGERY     REPAIR ANKLE LIGAMENT Right 04/05/2014   x2   TONSILLECTOMY     TOTAL HIP ARTHROPLASTY Left 04/12/2020   Procedure: LEFT TOTAL HIP ARTHROPLASTY ANTERIOR APPROACH;  Surgeon: Mcarthur Rossetti, MD;  Location: [facility];  Service: Orthopedics;  Laterality: Left;    Current  Outpatient Medications  Medication Sig Dispense Refill   amoxicillin (AMOXIL) 500 MG tablet Take two tabs by mouth ONE hour before dental appointment, then two tabs SIX hours after 8 tablet 0   Ascorbic Acid (VITAMIN C PO) Take 1,000 mg by mouth daily.      beclomethasone (QVAR) 80 MCG/ACT inhaler Inhale 1 puff into the lungs daily. 3 each 3   busPIRone (BUSPAR) 15 MG tablet Take 1 tablet (15 mg total) by mouth 2 (two) times daily. (Patient taking differently: Take 15 mg by mouth at bedtime.) 180 tablet 1   CALCIUM PO Take 600 mg by mouth daily. Citracal     cyclobenzaprine (FLEXERIL) 5 MG tablet Take 1 tablet (5 mg total) by mouth at bedtime as needed for muscle spasms. 30 tablet 2   fluticasone (FLONASE) 50 MCG/ACT nasal spray Place 1 spray into both nostrils daily.     Magnesium 400 MG CAPS Take 400 mg by mouth daily.      Multiple Vitamin (MULTIVITAMIN) tablet Take 1 tablet by mouth daily.     zinc gluconate 50 MG tablet Take 50 mg by mouth daily.     No current facility-administered medications for  this visit.    Family History  Problem Relation Age of Onset   Hypertension Father    Heart disease Father    Diabetes Father    Heart failure Father        34 death   Uterine cancer Sister    Diabetes Sister    Stroke Mother        18 death- hemorrhagic   Healthy Brother        other than smoker   Hypertension Sister    Colon cancer Neg Hx    Esophageal cancer Neg Hx    Stomach cancer Neg Hx    Rectal cancer Neg Hx     Review of Systems  All other systems reviewed and are negative.   Exam:   BP 122/80 (BP Location: Right Arm, Patient Position: Sitting, Cuff Size: Large)   Ht 5\' 5"  (1.651 m)   Wt 224 lb (101.6 kg)   LMP 10/14/2000   BMI 37.28 kg/m   Height: 5\' 5"  (165.1 cm)  General appearance: alert, cooperative and appears stated age Head: Normocephalic, without obvious abnormality, atraumatic Neck: no adenopathy, supple, symmetrical, trachea midline and thyroid  normal to inspection and palpation Lungs: clear to auscultation bilaterally Breasts: normal appearance, no masses or tenderness Heart: regular rate and rhythm Abdomen: soft, non-tender; bowel sounds normal; no masses,  no organomegaly Extremities: extremities normal, atraumatic, no cyanosis or edema Skin: Skin color, texture, turgor normal. No rashes or lesions Lymph nodes: Cervical, supraclavicular, and axillary nodes normal. No abnormal inguinal nodes palpated Neurologic: Grossly normal   Pelvic: External genitalia:  no lesions              Urethra:  normal appearing urethra with no masses, tenderness or lesions              Bartholins and Skenes: normal                 Vagina: normal appearing vagina with normal color and discharge, no lesions              Cervix: no lesions              Pap taken: Yes.   Bimanual Exam:  Uterus:  Surgically absent              Adnexa: normal adnexa and no mass, fullness, tenderness               Rectovaginal: Confirms               Anus:  normal sphincter tone, no lesions  Chaperone, Cydney Ok, CMA, was present for exam.  Assessment/Plan: 1. Well woman exam with routine gynecological exam - pap obtained per pt request - MMG due.  Pt states she will schedule - colonoscopy due next year - BMD 2018.  Repeat next year - lab work is planned with Dr. Yong Channel in May - vaccine reviewed  2. Postmenopausal - no HRT  3. History of hysterectomy (due to fibroids) - pt feels like she has a part of her cervix that was left but pathology reviewed with her and she did have her cervix removed.

## 2020-08-01 ENCOUNTER — Other Ambulatory Visit: Payer: Self-pay | Admitting: Psychiatry

## 2020-08-01 LAB — CYTOLOGY - PAP
Comment: NEGATIVE
Diagnosis: NEGATIVE
High risk HPV: NEGATIVE

## 2020-08-02 ENCOUNTER — Other Ambulatory Visit: Payer: Self-pay

## 2020-08-02 ENCOUNTER — Ambulatory Visit
Admission: RE | Admit: 2020-08-02 | Discharge: 2020-08-02 | Disposition: A | Discharge status: Home / Self Care | Payer: 59 | Source: Ambulatory Visit | Attending: Obstetrics & Gynecology | Admitting: Obstetrics & Gynecology

## 2020-08-02 DIAGNOSIS — Z1231 Encounter for screening mammogram for malignant neoplasm of breast: Secondary | ICD-10-CM

## 2020-09-23 ENCOUNTER — Other Ambulatory Visit: Payer: 59

## 2020-09-29 ENCOUNTER — Ambulatory Visit (INDEPENDENT_AMBULATORY_CARE_PROVIDER_SITE_OTHER): Payer: 59 | Admitting: Psychiatry

## 2020-09-29 ENCOUNTER — Encounter: Payer: Self-pay | Admitting: Psychiatry

## 2020-09-29 ENCOUNTER — Other Ambulatory Visit: Payer: Self-pay

## 2020-09-29 DIAGNOSIS — F341 Dysthymic disorder: Secondary | ICD-10-CM

## 2020-09-29 DIAGNOSIS — F5105 Insomnia due to other mental disorder: Secondary | ICD-10-CM | POA: Diagnosis not present

## 2020-09-29 DIAGNOSIS — Z8659 Personal history of other mental and behavioral disorders: Secondary | ICD-10-CM | POA: Diagnosis not present

## 2020-09-29 DIAGNOSIS — F411 Generalized anxiety disorder: Secondary | ICD-10-CM | POA: Diagnosis not present

## 2020-09-29 MED ORDER — ALPRAZOLAM 0.5 MG PO TABS: 0.5000 mg | ORAL_TABLET | Freq: Two times a day (BID) | ORAL | 2 refills | Status: DC | PRN

## 2020-09-29 NOTE — Progress Notes (Signed)
[name] [id number] [birth year] 65 y.o.     Subjective:   Patient ID:  Bridget Joyce is a 65 y.o. (DOB 1955/11/06) female.  Chief Complaint:  Chief Complaint  Patient presents with  . Follow-up    HPI Bridget Joyce presents  today for follow-up of anxiety and mood.  visit December 2020.  She wanted to wean off the citalopram to see if she can lose weight.  08/11/19 appt with the following noted: She called August 03, 2019 wanting to restart an antidepressant.  Her favorite cat had died.  She stated she felt the worst she ever had in her life.  She was having crying spells, depression, and insomnia along with a lot of anxiety with somatic symptoms and prominent guilt feelings.  As we had discussed before an alternative to citalopram was Lexapro she wanted to start the medication. Prescription sent for Lexapro 10 mg every evening. She started Lexapro at 5 mg daily.  Tolerated.  No effect so far.  A lot of guilt feelings over the loss of the cat. Functioning OK at work.  Prefers not to be alone initially but better some. Has supportive friends and sister. Plan: Start Lexapro 10 mg daily.  12/29/19 appt with the following noted: Increased to 10 mg Lexapro and felt better.  Trying to stop it now.  I felt better now re: cat.  Afraid that it can increase her appetite and wants to wean. Back down to 5 mg Lexapro for 2-3 weeks or so and now down to 1/4 tablet for a month..   Patient reports stable mood and denies depressed or irritable moods.  Patient denies any recent difficulty with more than mild anxiety.  Patient denies difficulty with sleep initiation or maintenance. Denies appetite disturbance.  Patient reports that energy and motivation have been good.  Patient denies any difficulty with concentration.  Patient denies any suicidal ideation.  Plan: She wants to wean Lexapro in hopes of losing weight.  Disc risk of relapse. Disc dosing and effect.  She will stop it.  09/29/2020  appointment with the following noted: Did not lose weight off Lexapro. Overall doing well since here.  Feels about same off Lexapro.  Problems with [employer] today was frustrating. Poor customer service.   Anxiety level is manageable generally.  Not markedly depressed. Taking only buspirone 15 mg HS bc dizzy if takes in the am. Wants prn alprazolam for sleep. Patient reports stable mood and denies depressed or irritable moods.  Patient denies any recent difficulty with anxiety.  Patient denies difficulty with sleep initiation or maintenance. Denies appetite disturbance.  Patient reports that energy and motivation have been good.  Patient denies any difficulty with concentration.  Patient denies any suicidal ideation.  Work pretty good.   Past Psychiatric Medication Trials:  Wellbutrin anxiety, sertraline tremor, Paxil sweats, citalopram,  buspirone, Xanax, propranolol, Lyrica, Trintellix, gabapentin, fluoxetine, Effexor 150, mirtazapine, Adderall, duloxetine  Review of Systems:  Review of Systems  Respiratory: Negative for shortness of breath.   Cardiovascular: Negative for chest pain and palpitations.  Musculoskeletal: Positive for arthralgias.  Neurological: Positive for headaches. Negative for dizziness, tremors and weakness.  Psychiatric/Behavioral: Negative for agitation, behavioral problems, confusion, decreased concentration, dysphoric mood, hallucinations, self-injury, sleep disturbance and suicidal ideas. The patient is not nervous/anxious and is not hyperactive.     Medications: I have reviewed the patient's current medications.  Current Outpatient Medications  Medication Sig Dispense Refill  . amoxicillin (AMOXIL) 500 MG tablet Take two  tabs by mouth ONE hour before dental appointment, then two tabs SIX hours after 8 tablet 0  . Ascorbic Acid (VITAMIN C PO) Take 1,000 mg by mouth daily.     . beclomethasone (QVAR) 80 MCG/ACT inhaler Inhale 1 puff into the lungs daily. 3 each 3  .  busPIRone (BUSPAR) 15 MG tablet TAKE 1 TABLET BY MOUTH TWICE A DAY 180 tablet 0  . CALCIUM PO Take 600 mg by mouth daily. Citracal    . cyclobenzaprine (FLEXERIL) 5 MG tablet Take 1 tablet (5 mg total) by mouth at bedtime as needed for muscle spasms. 30 tablet 2  . fluticasone (FLONASE) 50 MCG/ACT nasal spray Place 1 spray into both nostrils daily.    . Magnesium 400 MG CAPS Take 400 mg by mouth daily.     . Multiple Vitamin (MULTIVITAMIN) tablet Take 1 tablet by mouth daily.    Marland Kitchen zinc gluconate 50 MG tablet Take 50 mg by mouth daily.     No current facility-administered medications for this visit.    Medication Side Effects: None  Allergies:  Allergies  Allergen Reactions  . [place] Grass Extract Itching  . Grass Pollen(K-O-R-T-Swt Vern)   . Mold Extract  [Trichophyton] Itching  . Molds & Smuts   . Other Other (See Comments)    Dust mite/ unknown  . Short Ragweed Pollen Ext     Past Medical History:  Diagnosis Date  . Allergy    rag weed, pollen,dust  . Ankle fracture 12/2017   Left   . Anxiety    mild; infrequent per patient  . Asthma   . Bursitis of hip   . Diverticulosis   . External hemorrhoids   . Fibroid   . Fibromyalgia   . Migraine    Ocular  . Obesity   . Peripheral edema    infrequent per patient  . Pre-diabetes   . Vitamin D deficiency     Family History  Problem Relation Age of Onset  . Hypertension Father   . Heart disease Father   . Diabetes Father   . Heart failure Father        22 death  . Uterine cancer Sister   . Diabetes Sister   . Stroke Mother        53 death- hemorrhagic  . Healthy Brother        other than smoker  . Hypertension Sister   . Colon cancer Neg Hx   . Esophageal cancer Neg Hx   . Stomach cancer Neg Hx   . Rectal cancer Neg Hx     Social History   Socioeconomic History  . Marital status: Single    Spouse name: Not on file  . Number of children: Not on file  . Years of education: Not on file  . Highest  education level: Not on file  Occupational History  . Occupation: Event organiser: [employer]  Tobacco Use  . Smoking status: Never Smoker  . Smokeless tobacco: Never Used  Vaping Use  . Vaping Use: Never used  Substance and Sexual Activity  . Alcohol use: No  . Drug use: No  . Sexual activity: Not Currently    Birth control/protection: Abstinence, Surgical  Other Topics Concern  . Not on file  Social History Narrative   Lives alone with 2 cats. Never Married. No kids.       Works for [employer] for 29 years in 2022.       Hobbies:  time with friends, enjoy [employer] (has been there since she was a child- perhaps 6 months), time with family- 2 sisters and a brother      Advanced directives: full code, has Living will, HCPOA- older sister Alwyn Pea (on file after hip replacement)   Social Determinants of Health   Financial Resource Strain: Not on file  Food Insecurity: Not on file  Transportation Needs: Not on file  Physical Activity: Not on file  Stress: Not on file  Social Connections: Not on file  Intimate Partner Violence: Not on file    Past Medical History, Surgical history, Social history, and Family history were reviewed and updated as appropriate.   Please see review of systems for further details on the patient's review from today.   Objective:   Physical Exam:  LMP 10/14/2000   Physical Exam Constitutional:      General: She is not in acute distress.    Appearance: She is well-developed. She is obese.  Musculoskeletal:        General: No deformity.  Neurological:     Mental Status: She is alert and oriented to person, place, and time.     Motor: No tremor.     Coordination: Coordination normal.     Gait: Gait normal.  Psychiatric:        Attention and Perception: Attention and perception normal.        Mood and Affect: Mood is anxious. Mood is not depressed. Affect is not labile, blunt, angry or inappropriate.        Speech: Speech  normal.        Behavior: Behavior normal.        Thought Content: Thought content normal. Thought content does not include homicidal or suicidal ideation. Thought content does not include homicidal or suicidal plan.        Cognition and Memory: Cognition normal.        Judgment: Judgment normal.     Comments: Insight intact. No auditory or visual hallucinations. No delusions.       Lab Review:     Component Value Date/Time   NA 140 04/13/2020 0302   K 4.5 04/13/2020 0302   CL 106 04/13/2020 0302   CO2 27 04/13/2020 0302   GLUCOSE 159 (H) 04/13/2020 0302   BUN 16 04/13/2020 0302   CREATININE 0.80 04/13/2020 0302   CREATININE 0.97 03/07/2016 1651   CALCIUM 8.9 04/13/2020 0302   PROT 7.2 03/07/2016 1651   ALBUMIN 4.6 03/07/2016 1651   AST 24 03/07/2016 1651   ALT 26 03/07/2016 1651   ALKPHOS 97 03/07/2016 1651   BILITOT 0.5 03/07/2016 1651   GFRNONAA >60 04/13/2020 0302   GFRNONAA 64 03/07/2016 1651   GFRAA 74 03/07/2016 1651       Component Value Date/Time   WBC 10.8 (H) 04/13/2020 0302   RBC 3.56 (L) 04/13/2020 0302   HGB 11.1 (L) 04/13/2020 0302   HGB 13.1 02/26/2020 1325   HCT 33.7 (L) 04/13/2020 0302   HCT 40.1 02/26/2020 1325   PLT 285 04/13/2020 0302   PLT 310 02/26/2020 1325   MCV 94.7 04/13/2020 0302   MCV 96 02/26/2020 1325   MCH 31.2 04/13/2020 0302   MCHC 32.9 04/13/2020 0302   RDW 12.3 04/13/2020 0302   RDW 12.7 02/26/2020 1325   LYMPHSABS 2.1 02/26/2020 1325   MONOABS 518 03/07/2016 1651   EOSABS 0.2 02/26/2020 1325   BASOSABS 0.0 02/26/2020 1325    No results found for:  POCLITH, LITHIUM   No results found for: PHENYTOIN, PHENOBARB, VALPROATE, CBMZ   .res Assessment: Plan:    Generalized anxiety disorder  History of major depression  Dysthymia    Greater than 50% of 30 min face to face time with patient was spent on counseling and coordination of care and grief counseling.  Better than last time.   Disc mechanism of action of SSRI vs  buspirone.   She didn't lose weight off Lexapro but does not feel any worse either.  She will plan to increase the buspirone to 30 mg nightly because she gets dizzy if she splits the dose.  As can been seen before above she has tried multiple other psych meds.  Disc SE.    FU 6 mos  Lynder Parents, MD, DFAPA   Please see After Visit Summary for patient specific instructions.  Future Appointments  Date Time Provider [place]  11/10/2020  4:00 PM [facility]  11/23/2020  3:30 PM Mcarthur Rossetti, MD [facility] None  08/03/2021  3:45 PM Megan Salon, MD [facility]    No orders of the defined types were placed in this encounter.     -------------------------------

## 2020-10-26 ENCOUNTER — Ambulatory Visit: Payer: 59 | Admitting: Orthopaedic Surgery

## 2020-10-26 ENCOUNTER — Ambulatory Visit: Payer: Self-pay

## 2020-10-26 ENCOUNTER — Encounter: Payer: Self-pay | Admitting: Orthopaedic Surgery

## 2020-10-26 DIAGNOSIS — Z96642 Presence of left artificial hip joint: Secondary | ICD-10-CM | POA: Diagnosis not present

## 2020-10-26 NOTE — Progress Notes (Signed)
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  She says the hip is doing well and she feels like her leg lengths are equal.  She has been dealing with pain in her posterior pelvis and lower back to the left side and she is concerned about this.  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  She does have pain in the posterior pelvis and the lower facet joints on the left side and some of this is at the SI joint.  Her left operative hip moves smoothly and fluidly.  There is a mild amount of pain over the trochanteric areas.  An AP pelvis and lateral of the left hip shows a well-seated total hip arthroplasty with no complicating features.  Her offset and leg lengths are equal.  The AP view of the pelvis does show the lower lumbar spine which shows significant degenerative changes at the lower lumbar spine.  I gave her reassurance that her hip is doing well and she agrees with this as well.  She would like to see Dr. Ernestina Patches to consider a left-sided SI joint injection versus a lower L5-S1 facet injection.  We will set that appointment up with Dr. Ernestina Patches.  She is seen neurosurgery in the past and she may want to see them again for follow-up.  She has never had any surgery on her lumbar spine.  From my standpoint, I will see her back in 6 months with a repeat AP and lateral of her left hip.

## 2020-10-27 ENCOUNTER — Other Ambulatory Visit: Payer: Self-pay

## 2020-10-27 ENCOUNTER — Encounter: Payer: Self-pay | Admitting: Orthopaedic Surgery

## 2020-10-27 DIAGNOSIS — G8929 Other chronic pain: Secondary | ICD-10-CM

## 2020-10-27 DIAGNOSIS — M545 Low back pain, unspecified: Secondary | ICD-10-CM

## 2020-10-29 ENCOUNTER — Other Ambulatory Visit: Payer: Self-pay | Admitting: Psychiatry

## 2020-11-10 ENCOUNTER — Other Ambulatory Visit: Payer: 59

## 2020-11-11 ENCOUNTER — Telehealth: Payer: Self-pay | Admitting: Physical Medicine and Rehabilitation

## 2020-11-11 NOTE — Telephone Encounter (Signed)
Pt called stating she missed a call to set an appt for an inj and would like to be tried again.   [phone]

## 2020-11-23 ENCOUNTER — Ambulatory Visit: Payer: 59 | Admitting: Orthopaedic Surgery

## 2020-11-29 ENCOUNTER — Telehealth: Payer: Self-pay

## 2020-11-29 NOTE — Telephone Encounter (Signed)
Pt called about her lab appt. She stated that she has a lab appt scheduled and she has a few questions about the labs that will be done. Can someone call her?

## 2020-11-30 ENCOUNTER — Encounter: Payer: Self-pay | Admitting: Physical Medicine and Rehabilitation

## 2020-11-30 ENCOUNTER — Ambulatory Visit (INDEPENDENT_AMBULATORY_CARE_PROVIDER_SITE_OTHER): Payer: 59 | Admitting: Physical Medicine and Rehabilitation

## 2020-11-30 ENCOUNTER — Ambulatory Visit: Payer: Self-pay

## 2020-11-30 DIAGNOSIS — M461 Sacroiliitis, not elsewhere classified: Secondary | ICD-10-CM | POA: Diagnosis not present

## 2020-11-30 NOTE — Progress Notes (Signed)
Bridget [facility] - 65 y.o. female MRN [id number]  Date of birth: 1956-01-28  Office Visit Note: Visit Date: 11/30/2020 PCP: Marin Olp, MD Referred by: Marin Olp, MD  Subjective: Chief Complaint  Patient presents with   Lower Back - Pain   Left Hip - Pain   Right Hip - Pain   HPI:  Bridget Joyce is a 65 y.o. female who comes in today At the request of Dr. Jean Rosenthal for left sacroiliac joint injection versus left L5-S1 facet joint block under fluoroscopic guidance.  Initial referral was closed because the patient had not returned calls and it seems like talking with her today she had sought care with [facility].  Nonetheless she is here today with actually more right than left hip pain.  She is about 8 months status post left total hip arthroplasty and doing well from that.  She reports a history of prior sciatic problems and back pain and disc ruptures.  She has seen Dr. Kristeen Miss and I believe may be Dr. Ronnald Ramp at [facility].  She has lumbar spine MRI from 2017 which is reviewed today and reviewed below.  This shows prior laminectomy at L5-S1.  She has facet arthropathy at L4-5 and L5-S1 with right disc protrusion at L4-5 and lateral recess narrowing at both levels.  She reports today that her pain is definitely from the sacroiliac joint.  She points to the pelvic and hip area with a positive Fortin finger sign.  I did elect today to complete a right sacroiliac joint injection under fluoroscopic guidance.  Depending on relief could look at diagnostic medial branch blocks of the lumbar spine.  She may wish to be reevaluated from a neurosurgical standpoint with updated imaging.  ROS Otherwise per HPI.  Assessment & Plan: Visit Diagnoses:    ICD-10-CM   1. Sacroiliitis (HCC)  M46.1 Sacroiliac Joint Inj    XR C-ARM NO REPORT      Plan: No additional findings.   Meds & Orders: No orders of the defined types were placed in  this encounter.   Orders Placed This Encounter  Procedures   Sacroiliac Joint Inj   XR C-ARM NO REPORT    Follow-up: Return if symptoms worsen or fail to improve.   Procedures: Sacroiliac Joint Inj (Right) on 11/30/2020 3:21 PM Indications: pain and diagnostic evaluation Details: 22 G 3.5 in needle, fluoroscopy-guided posterior approach Medications: 2 mL bupivacaine 0.5 %; 40 mg triamcinolone acetonide 40 MG/ML Outcome: tolerated well, no immediate complications  Sacroiliac Joint Intra-Articular Injection - Posterior Approach with Fluoroscopic Guidance   Position: PRONE  Additional Comments: Vital signs were monitored before and after the procedure. Patient was prepped and draped in the usual sterile fashion. The correct patient, procedure, and site was verified.   Injection Procedure Details:   Location/Site:  Sacroiliac joint  Needle size: 3.5 in Spinal Needle  Needle type: Spinal  Needle Placement: Intra-articular  Findings:  -Comments: There was excellent flow of contrast producing a partial arthrogram of the sacroiliac joint.   Procedure Details: Starting with a 90 degree vertical and midline orientation the fluoroscope was tilted cranially 20 to 25 degrees and the target area of the inferior most part of the SI joint on the side mentioned above was visualized.  The soft tissues overlying this target were infiltrated with 4 ml. of 1% Lidocaine without Epinephrine. A #22 gauge spinal needle was inserted perpendicular to the fluoroscope table and advanced  into the posterior inferior joint space using fluoroscopic guidance.  Position in the joint space was confirmed by obtaining a partial arthrogram using a 2 ml. volume of Isovue-250 contrast agent. After negative aspirate for gross pus or blood, the injectate was delivered to the joint. Radiographs were obtained for documentation purposes.   Additional Comments:   Dressing: Bandaid    Post-procedure  details: Patient was observed during the procedure. Post-procedure instructions were reviewed.  Patient left the clinic in stable condition.    There was excellent flow of contrast producing a partial arthrogram of the sacroiliac joint.  Procedure, treatment alternatives, risks and benefits explained, specific risks discussed. Consent was given by the patient. Immediately prior to procedure a time out was called to verify the correct patient, procedure, equipment, support staff and site/side marked as required. Patient was prepped and draped in the usual sterile fashion.         Clinical History: MRI LUMBAR SPINE WITHOUT CONTRAST   TECHNIQUE: Multiplanar, multisequence MR imaging of the lumbar spine was performed. No intravenous contrast was administered.   COMPARISON:  [place] lumbar radiographs 12/02/2014. Lumbar MRI 11/27/2010.   FINDINGS: Normal lumbar segmentation demonstrated on the 2016 radiographs which is the same numbering system used in 2012. Stable vertebral height and alignment. Trace retrolisthesis at L4-L5. No marrow edema or evidence of acute osseous abnormality.   Visualized lower thoracic spinal cord is normal with conus medularis at L1.   Visualized abdominal viscera and paraspinal soft tissues are within normal limits.   T11-T12: Chronic moderate to severe ligament flavum hypertrophy. No definite stenosis.   T12-L1:  Negative.   L1-L2:  Negative.   L2-L3: Minimal disc bulge. Stable mild chronic facet hypertrophy, trace facet joint fluid on the right. No stenosis.   L3-L4: Negative disc. Chronic moderate facet hypertrophy appears stable. Less facet joint fluid on the right. No stenosis.   L4-L5: Chronic disc desiccation and circumferential disc bulge. Superimposed broad-based right paracentral disc protrusion has mildly regressed since 2012 (series 10, image 22 today versus series 6, image 24 previously). Less effacement of the  descending right L5 nerve roots in the lateral recess. Stable left lateral recess. Moderate chronic facet hypertrophy. Trace facet joint fluid has decreased. No spinal stenosis. Stable neural foramina without convincing foraminal stenosis.   L5-S1: Chronic severe disc desiccation and disc space loss with bulky but mostly far lateral circumferential disc osteophyte complex. Postoperative changes to the bilateral posterior elements. Moderate residual facet hypertrophy appears stable. No spinal stenosis. Stable lateral recesses without convincing stenosis. Mild to moderate bilateral L5 foraminal stenosis appears stable, is greater on the left, and primarily due to bony endplate spurring.   IMPRESSION: 1. Chronic L4-L5 disc degeneration, but regressed right paracentral disc protrusion since 2012, with less involvement of the descending right L5 nerve roots. Up to mild left lateral recess stenosis there is stable. No spinal or convincing foraminal stenosis. 2. Advanced chronic disc and endplate degeneration at L5-S1 appears stable along with postoperative changes to the posterior elements. No spinal or lateral recess stenosis. Mild to moderate bilateral L5 foraminal stenosis is stable and primarily due to endplate spurring. 3. Mild mostly facet related lumbar spine degeneration elsewhere.     Electronically Signed   By: Genevie Ann M.D.   On: 05/25/2015 07:05     Objective:  VS:  HT:    WT:   BMI:     BP:   HR: bpm  TEMP: ( )  RESP:  Physical  Exam Vitals and nursing note reviewed.  Constitutional:      General: She is not in acute distress.    Appearance: Normal appearance. She is obese. She is not ill-appearing.  HENT:     Head: Normocephalic and atraumatic.     Right Ear: External ear normal.     Left Ear: External ear normal.  Eyes:     Extraocular Movements: Extraocular movements intact.  Cardiovascular:     Rate and Rhythm: Normal rate.     Pulses: Normal pulses.   Pulmonary:     Effort: Pulmonary effort is normal. No respiratory distress.  Abdominal:     General: There is no distension.     Palpations: Abdomen is soft.  Musculoskeletal:        General: Tenderness present.     Cervical back: Neck supple.     Right lower leg: No edema.     Left lower leg: No edema.     Comments: Patient has good distal strength with no pain over the greater trochanters.  No clonus or focal weakness.  Skin:    Findings: No erythema, lesion or rash.  Neurological:     General: No focal deficit present.     Mental Status: She is alert and oriented to person, place, and time.     Sensory: No sensory deficit.     Motor: No weakness or abnormal muscle tone.     Coordination: Coordination normal.  Psychiatric:        Mood and Affect: Mood normal.        Behavior: Behavior normal.     Imaging: No results found.

## 2020-11-30 NOTE — Telephone Encounter (Signed)
Pt called back told her what labs Dr. Yong Channel ordered. Pt verbalized understanding.

## 2020-11-30 NOTE — Progress Notes (Signed)
Pt state lower back pain that travels to buttock and both hips. Pt state at night while laying down is where she feels the most pain. Pt state she take pain meds to help ease her pain.  Numeric Pain Rating Scale and Functional Assessment Average Pain 4   In the last MONTH (on 0-10 scale) has pain interfered with the following?  1. General activity like being  able to carry out your everyday physical activities such as walking, climbing stairs, carrying groceries, or moving a chair?  Rating(7)   +Driver, -BT, -Dye Allergies.

## 2020-11-30 NOTE — Patient Instructions (Signed)

## 2020-11-30 NOTE — Telephone Encounter (Signed)
Left message on voicemail to call office.  

## 2020-12-19 MED ORDER — BUPIVACAINE HCL 0.5 % IJ SOLN
2.0000 mL | INTRAMUSCULAR | Status: AC | PRN
  Administered 2020-11-30: 2 mL via INTRA_ARTICULAR

## 2020-12-19 MED ORDER — TRIAMCINOLONE ACETONIDE 40 MG/ML IJ SUSP
40.0000 mg | INTRAMUSCULAR | Status: AC | PRN
  Administered 2020-11-30: 40 mg via INTRA_ARTICULAR

## 2021-02-09 ENCOUNTER — Other Ambulatory Visit: Payer: 59

## 2021-03-09 ENCOUNTER — Other Ambulatory Visit: Payer: Self-pay

## 2021-03-09 ENCOUNTER — Telehealth (INDEPENDENT_AMBULATORY_CARE_PROVIDER_SITE_OTHER): Payer: 59 | Admitting: Family Medicine

## 2021-03-09 ENCOUNTER — Encounter: Payer: Self-pay | Admitting: Family Medicine

## 2021-03-09 ENCOUNTER — Telehealth: Payer: Self-pay

## 2021-03-09 VITALS — Temp 98.9°F | Ht 65.0 in | Wt 208.0 lb

## 2021-03-09 DIAGNOSIS — J4530 Mild persistent asthma, uncomplicated: Secondary | ICD-10-CM

## 2021-03-09 DIAGNOSIS — U071 COVID-19: Secondary | ICD-10-CM | POA: Diagnosis not present

## 2021-03-09 MED ORDER — ALBUTEROL SULFATE HFA 108 (90 BASE) MCG/ACT IN AERS: 2.0000 | INHALATION_SPRAY | Freq: Four times a day (QID) | RESPIRATORY_TRACT | 2 refills | Status: DC | PRN

## 2021-03-09 MED ORDER — ALBUTEROL SULFATE HFA 108 (90 BASE) MCG/ACT IN AERS: 2.0000 | INHALATION_SPRAY | Freq: Four times a day (QID) | RESPIRATORY_TRACT | 3 refills | Status: AC | PRN

## 2021-03-09 MED ORDER — MOLNUPIRAVIR EUA 200MG CAPSULE: 4.0000 | ORAL_CAPSULE | Freq: Two times a day (BID) | ORAL | 0 refills | Status: AC

## 2021-03-09 NOTE — Progress Notes (Signed)
Phone [phone] Virtual visit via Video note   Subjective:  Chief complaint: Chief Complaint  Patient presents with   Covid Positive    Pt states she tested positive at home yesterday afternoon. She c/o of scratchy throat and cough Tuesday night with nasal congestion and she has taken sudafed. Pt denies fever. She has been fatigued also. Pt would like pro air inhaler also.    This visit type was conducted due to national recommendations for restrictions regarding the COVID-19 Pandemic (e.g. social distancing).  This format is felt to be most appropriate for this patient at this time balancing risks to patient and risks to population by having him in for in person visit.  No physical exam was performed (except for noted visual exam or audio findings with Telehealth visits).    Our team/I connected with Bridget Joyce at  4:20 PM EST by a video enabled telemedicine application (doxy.me or caregility through epic) and verified that I am speaking with the correct person using two identifiers.  Location patient: [place] Location provider: [facility], office Persons participating in the virtual visit:  patient  Our team/I discussed the limitations of evaluation and management by telemedicine and the availability of in person appointments. In light of current covid-19 pandemic, patient also understands that we are trying to protect them by minimizing in office contact if at all possible.  The patient expressed consent for telemedicine visit and agreed to proceed. Patient understands insurance will be billed.   Past Medical History-  Patient Active Problem List   Diagnosis Date Noted   Status post total replacement of left hip 04/12/2020    Priority: Medium    Fibromyalgia 10/29/2019    Priority: Medium    GAD (generalized anxiety disorder) 04/14/2018    Priority: Medium    Mixed hyperlipidemia 07/13/2014    Priority: Medium    Mild persistent asthma   12/19/2013    Priority: Medium     Prediabetes     Priority: Medium    Primary osteoarthritis of right knee 01/25/2017    Priority: Low   Vitamin D deficiency     Priority: Low   Myopia 10/28/2012    Priority: Low   GERD 05/26/2008    Priority: Low   Allergic rhinitis 05/20/2008    Priority: Low   Diverticulosis 09/02/2006    Priority: Low   Body mass index (BMI) 37.0-37.9, adult 05/19/2019    Medications- reviewed and updated Current Outpatient Medications  Medication Sig Dispense Refill   ALPRAZolam (XANAX) 0.5 MG tablet Take 1 tablet (0.5 mg total) by mouth 2 (two) times daily as needed for anxiety. 60 tablet 2   amoxicillin (AMOXIL) 500 MG tablet Take two tabs by mouth ONE hour before dental appointment, then two tabs SIX hours after 8 tablet 0   Ascorbic Acid (VITAMIN C PO) Take 1,000 mg by mouth daily.      beclomethasone (QVAR) 80 MCG/ACT inhaler Inhale 1 puff into the lungs daily. 3 each 3   busPIRone (BUSPAR) 15 MG tablet TAKE 1 TABLET BY MOUTH TWICE A DAY 180 tablet 2   CALCIUM PO Take 600 mg by mouth daily. Citracal     cyclobenzaprine (FLEXERIL) 5 MG tablet Take 1 tablet (5 mg total) by mouth at bedtime as needed for muscle spasms. 30 tablet 2   fluticasone (FLONASE) 50 MCG/ACT nasal spray Place 1 spray into both nostrils daily.     Magnesium 400 MG CAPS Take 400 mg by mouth daily.  molnupiravir EUA (LAGEVRIO) 200 mg CAPS capsule Take 4 capsules (800 mg total) by mouth 2 (two) times daily for 5 days. 40 capsule 0   Multiple Vitamin (MULTIVITAMIN) tablet Take 1 tablet by mouth daily.     zinc gluconate 50 MG tablet Take 50 mg by mouth daily.     albuterol (VENTOLIN HFA) 108 (90 Base) MCG/ACT inhaler Inhale 2 puffs into the lungs every 6 (six) hours as needed for wheezing or shortness of breath. 3 each 3   No current facility-administered medications for this visit.     Objective:  Temp 98.9 F (37.2 C)   Ht 5\' 5"  (1.651 m)   Wt 208 lb (94.3 kg)   LMP 10/14/2000   BMI 34.61 kg/m  self  reported vitals Gen: NAD, resting comfortably Lungs: nonlabored, normal respiratory rate  Skin: appears dry, no obvious rash     Assessment and Plan   # COVID-19 Positive  S:Patient reports that she had a positive at home test yesterday. Symptoms include sore/irritated throat, cough on Tuesday night (when all symptoms started)- also had some nasal congestion as well,  and fatigue. She has tried Sudafed with some relief (helped with sinus pressure). Denies any fever. - patient has asthma - has been consistent with qvar during this time- has used albuterol on Tuesday. Took mucinex DM today. Tylenol helps with body aches A/P: Patient with testing confirming covid 19 with first day of covid 19 symptoms Tuesday Vaccination status:3 vaccinations total- last one last november (knows has to wait at least 3 months after this infection if she wants to get bivalent booster)  Therefore: - recommended patient watch closely for shortness of breath or confusion or worsening symptoms and if those occur patient should contact us immediately or seek care in the emergency department -recommended patient consider purchasing pulse oximeter and if levels 94% or below persistently- seek care at the hospital - Patient needs to self isolate  for at least 5 days since first symptom AND at least 24 hours fever free without fever reducing medications AND have improvement in respiratory symptoms . After 5 days can end self isolation but still needs to wear mask for additional 5 days .  - work note not needed -Patient should inform close contacts about exposure (anyone patient been around unmasked for more than 15 minutes)    If High risk for complications-we discussed outpatient therapeutic options including paxlovid (and risk of rebound), molnupiravir, MAB infusion - patient opted to have molnupiravir sent in but is undecided whether she wants to start this at present  #Asthma - Patient uses Qvar 80 mcg/act inhaler  daily-appears overall stable despite COVID infection-if had any significant worsening could certainly consider prednisone.  Did refill albuterol though  Recommended follow up:  Future Appointments  Date Time Provider [place]  08/03/2021  3:45 PM Megan Salon, MD [facility]  09/28/2021  1:00 PM Cottle, Billey Co., MD [facility] None   Lab/Order associations:   ICD-10-CM   1. COVID-19  U07.1     2. Mild persistent asthma    J45.30       Meds ordered this encounter  Medications   DISCONTD: albuterol (VENTOLIN HFA) 108 (90 Base) MCG/ACT inhaler    Sig: Inhale 2 puffs into the lungs every 6 (six) hours as needed for wheezing or shortness of breath.    Dispense:  1 each    Refill:  2    Can substitute proair or other albuterol product per  insurance coverage   albuterol (VENTOLIN HFA) 108 (90 Base) MCG/ACT inhaler    Sig: Inhale 2 puffs into the lungs every 6 (six) hours as needed for wheezing or shortness of breath.    Dispense:  3 each    Refill:  3    Can substitute proair or other albuterol product per insurance coverage   molnupiravir EUA (LAGEVRIO) 200 mg CAPS capsule    Sig: Take 4 capsules (800 mg total) by mouth 2 (two) times daily for 5 days.    Dispense:  40 capsule    Refill:  0    Patient will call if decides to pick up    Return precautions advised.  Garret Reddish, MD

## 2021-03-09 NOTE — Telephone Encounter (Signed)
Sent to Dr. Yong Channel to address during pt virtual this pm.

## 2021-03-09 NOTE — Telephone Encounter (Signed)
..   Encourage patient to contact the pharmacy for refills or they can request refills through [place]:  06/17/2020  NEXT APPOINTMENT DATE:  Today  MEDICATION:  ProAir  Is the patient out of medication?   PHARMACY:  [facility] at [address]  Let patient know to contact pharmacy at the end of the day to make sure medication is ready.  Please notify patient to allow 48-72 hours to process  CLINICAL FILLS OUT ALL BELOW:   LAST REFILL:  QTY:  REFILL DATE:    OTHER COMMENTS:   Is requesting a 3 month script    Okay for refill?  Please advise

## 2021-03-10 ENCOUNTER — Telehealth: Payer: Self-pay

## 2021-03-10 NOTE — Telephone Encounter (Signed)
Pt called wanted to know if she needs to take an Asprin to prevent blood clots due to spike protein in covid. Please Advise.

## 2021-03-10 NOTE — Telephone Encounter (Signed)
See below

## 2021-03-10 NOTE — Telephone Encounter (Signed)
If she would like to take aspirin 81mg  that is fine while she has covid as long as no history of GI bleeds

## 2021-03-13 NOTE — Telephone Encounter (Signed)
Called and spoke with pt and below message given. 

## 2021-05-08 ENCOUNTER — Ambulatory Visit: Payer: 59 | Admitting: Orthopaedic Surgery

## 2021-05-08 ENCOUNTER — Ambulatory Visit: Payer: Self-pay

## 2021-05-08 ENCOUNTER — Encounter: Payer: Self-pay | Admitting: Orthopaedic Surgery

## 2021-05-08 DIAGNOSIS — Z96642 Presence of left artificial hip joint: Secondary | ICD-10-CM | POA: Diagnosis not present

## 2021-05-08 NOTE — Progress Notes (Signed)
The patient is now a year out from a left total hip arthroplasty.  She says she is doing well with her left hip and reports good range of motion and strength.  She does have some chronic trochanteric bursitis and chronic sciatica bilaterally.  She says the hip is doing great.  Her left hip moves smoothly and fluidly with no complicating features at all.  Her leg lengths are equal.  She is walking without assistive device and no limp whatsoever.  She is young appearing.  An AP pelvis and lateral of her left hip shows a well-seated total hip arthroplasty with no complicating features.  Her right hip appears normal.  At this point follow-up for her hips can be as needed.  She will follow-up sometime with her spine surgeon to look at her back again at some point.  She is starting to have some right knee issues and if that bothers her at some point she can come back and see [place] for anything orthopedic wise.  All questions and concerns were answered and addressed.

## 2021-06-16 ENCOUNTER — Other Ambulatory Visit: Payer: Self-pay | Admitting: Family Medicine

## 2021-06-16 DIAGNOSIS — R079 Chest pain, unspecified: Secondary | ICD-10-CM

## 2021-06-16 NOTE — Progress Notes (Signed)
Intermittent, non-exertional, sharp substernal chest pains that last only a second or two. No aggravating or alleviating factors.

## 2021-06-20 ENCOUNTER — Telehealth: Payer: Self-pay

## 2021-06-20 ENCOUNTER — Other Ambulatory Visit: Payer: 59

## 2021-06-20 NOTE — Telephone Encounter (Signed)
See below

## 2021-06-20 NOTE — Telephone Encounter (Signed)
Patient has appt scheduled for Friday 2/24 for leg swelling.    Would like to know if she can be worked in sooner?  Did not want to see any other providers.

## 2021-06-20 NOTE — Telephone Encounter (Signed)
Unfortunately I do not see space for an earlier visit-please make sure she is not having shortness of breath, chest pain or other significant symptoms.  If she is having unilateral leg swelling I need to know about that immediately as well or calf pain on 1 side

## 2021-06-21 ENCOUNTER — Telehealth: Payer: Self-pay

## 2021-06-21 ENCOUNTER — Other Ambulatory Visit: Payer: 59

## 2021-06-21 NOTE — Telephone Encounter (Signed)
Called and lm on pt vm tcb. 

## 2021-06-21 NOTE — Telephone Encounter (Signed)
Pt returned call and below message given, pt will be here on Friday.

## 2021-06-21 NOTE — Telephone Encounter (Signed)
Patient has called in stating Kela gave her a call.   I can not find a note.    Patient had appt today scheduled for labs but cancelled it.   She is requesting call back in regard.

## 2021-06-21 NOTE — Telephone Encounter (Signed)
Called and spoke with pt and pt states to disregard this message.

## 2021-06-23 ENCOUNTER — Ambulatory Visit: Payer: 59 | Admitting: Family Medicine

## 2021-07-12 ENCOUNTER — Other Ambulatory Visit: Payer: Self-pay | Admitting: Family Medicine

## 2021-07-12 DIAGNOSIS — Z1231 Encounter for screening mammogram for malignant neoplasm of breast: Secondary | ICD-10-CM

## 2021-07-14 ENCOUNTER — Other Ambulatory Visit: Payer: Self-pay | Admitting: Family Medicine

## 2021-07-19 ENCOUNTER — Ambulatory Visit (HOSPITAL_BASED_OUTPATIENT_CLINIC_OR_DEPARTMENT_OTHER): Payer: 59 | Admitting: Obstetrics & Gynecology

## 2021-07-25 NOTE — Progress Notes (Signed)
66 y.o. G0P0 Single White or Caucasian Not Hispanic or Latino female here for annual exam. H/O hysterectomy. Not sexually active.  ?She is having some intermittent swelling in her legs.  About 3 weeks ago she had some vaginal itching for about a week and half, symptoms have resolved.  ?  ?She had some issues with urinary urgency, better now. Typically no urinary leakage.  ? ?No bowel c/o.  ? ?Patient's last menstrual period was 10/14/2000.          ?Sexually active: No.  ?The current method of family planning is status post hysterectomy.    ?Exercising: No.  The patient does not participate in regular exercise at present. ?Smoker:  no ? ?Health Maintenance: ?Pap: 07/28/20 WNL Hr hpv Neg,  01/31/17 Neg ?History of abnormal Pap:  no ?MMG:  08/04/20 denisty C Bi-rads 1 neg  ?BMD:   03/15/17 normal  ?Colonoscopy:  01/08/12 polypoid tissue.  Follow up in 10 years.  Done with Dr. Carlean Purl ?TDaP:  2014 ?Gardasil: n/a ? ? reports that she has never smoked. She has never used smokeless tobacco. She reports that she does not drink alcohol and does not use drugs. She works at [employer] in [place]. 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 Plans to work another few years.  ? ?Past Medical History:  ?Diagnosis Date  ? Allergy   ? rag weed, pollen,dust  ? Ankle fracture 12/2017  ? Left   ? Anxiety   ? mild; infrequent per patient  ? Asthma   ? Bursitis of hip   ? Diverticulosis   ? External hemorrhoids   ? Fibroid   ? Fibromyalgia   ? Migraine   ? Ocular  ? Obesity   ? Peripheral edema   ? infrequent per patient  ? Pre-diabetes   ? Vitamin D deficiency   ? ? ?Past Surgical History:  ?Procedure Laterality Date  ? ABDOMINAL HYSTERECTOMY  2002  ? ovaries remain  ? BLEPHAROPLASTY Bilateral   ? COLONOSCOPY  09/02/2006  ? external hemorrhoids,diverticulosis  ? EYE SURGERY Left   ? adult stabismus  ? KNEE SURGERY    ? Left arthroscopic  ? LIPOMA EXCISION    ? Labial  ? MANDIBLE SURGERY     ? REPAIR ANKLE LIGAMENT Right 04/05/2014  ? x2  ? TONSILLECTOMY    ? TOTAL HIP ARTHROPLASTY Left 04/12/2020  ? Procedure: LEFT TOTAL HIP ARTHROPLASTY ANTERIOR APPROACH;  Surgeon: Mcarthur Rossetti, MD;  Location: [facility];  Service: Orthopedics;  Laterality: Left;  ? ? ?Current Outpatient Medications  ?Medication Sig Dispense Refill  ? albuterol (VENTOLIN HFA) 108 (90 Base) MCG/ACT inhaler Inhale 2 puffs into the lungs every 6 (six) hours as needed for wheezing or shortness of breath. 3 each 3  ? ALPRAZolam (XANAX) 0.5 MG tablet Take 1 tablet (0.5 mg total) by mouth 2 (two) times daily as needed for anxiety. 60 tablet 2  ? Ascorbic Acid (VITAMIN C PO) Take 1,000 mg by mouth daily.     ? busPIRone (BUSPAR) 15 MG tablet TAKE 1 TABLET BY MOUTH TWICE A DAY 180 tablet 2  ? CALCIUM PO Take 600 mg by mouth daily. Citracal    ? fluticasone (FLONASE) 50 MCG/ACT nasal spray Place 1 spray into both nostrils daily.    ? Magnesium 400 MG CAPS Take 400 mg by mouth daily.     ? Multiple Vitamin (MULTIVITAMIN) tablet Take 1 tablet by  mouth daily.    ? QVAR REDIHALER 80 MCG/ACT inhaler TAKE 1 PUFF BY MOUTH EVERY DAY 31.8 g 3  ? zinc gluconate 50 MG tablet Take 50 mg by mouth daily.    ? ?No current facility-administered medications for this visit.  ? ? ?Family History  ?Problem Relation Age of Onset  ? Hypertension Father   ? Heart disease Father   ? Diabetes Father   ? Heart failure Father   ?     45 death  ? Uterine cancer Sister   ? Diabetes Sister   ? Stroke Mother   ?     75 death- hemorrhagic  ? Healthy Brother   ?     other than smoker  ? Hypertension Sister   ? Colon cancer Neg Hx   ? Esophageal cancer Neg Hx   ? Stomach cancer Neg Hx   ? Rectal cancer Neg Hx   ? ? ?Review of Systems  ?All other systems reviewed and are negative. ? ?Exam:   ?BP 122/66   Pulse 70   Ht '5\' 5"'$  (1.651 m)   Wt 220 lb (99.8 kg)   LMP 10/14/2000   SpO2 100%   BMI 36.61 kg/m?   Weight change: '@WEIGHTCHANGE'$ @ Height:   Height: '5\' 5"'$  (165.1  cm)  ?Ht Readings from Last 3 Encounters:  ?08/01/21 '5\' 5"'$  (1.651 m)  ?03/09/21 '5\' 5"'$  (1.651 m)  ?07/28/20 '5\' 5"'$  (1.651 m)  ? ? ?General appearance: alert, cooperative and appears stated age ?Head: Normocephalic, without obvious abnormality, atraumatic ?Neck: no adenopathy, supple, symmetrical, trachea midline and thyroid normal to inspection and palpation ?Lungs: clear to auscultation bilaterally ?Cardiovascular: regular rate and rhythm ?Breasts: normal appearance, no masses or tenderness ?Abdomen: soft, non-tender; non distended,  no masses,  no organomegaly ?Extremities: extremities normal, atraumatic, no cyanosis or edema ?Skin: Skin color, texture, turgor normal. No rashes or lesions ?Lymph nodes: Cervical, supraclavicular, and axillary nodes normal. ?No abnormal inguinal nodes palpated ?Neurologic: Grossly normal ? ? ?Pelvic: External genitalia:  no lesions ?             Urethra:  normal appearing urethra with no masses, tenderness or lesions ?             Bartholins and Skenes: normal    ?             Vagina: atrophic appearing vagina without discharge, no lesions ?             Cervix: absent ?              ?Bimanual Exam:  Uterus:  uterus absent ?             Adnexa: no mass, fullness, tenderness ?              Rectovaginal: Confirms ?              Anus:  normal sphincter tone, no lesions ? ? ? ?1. Well woman exam ?Discussed breast self exam ?Discussed calcium and vit D intake ?Mammogram scheduled ?Colonoscopy due in 9/23 ?DEXA UTD ?Labs with primary ? ? ?

## 2021-07-27 ENCOUNTER — Ambulatory Visit (HOSPITAL_COMMUNITY)
Admission: RE | Admit: 2021-07-27 | Discharge: 2021-07-27 | Disposition: A | Discharge status: Home / Self Care | Payer: 59 | Source: Ambulatory Visit | Attending: Family Medicine | Admitting: Family Medicine

## 2021-07-27 DIAGNOSIS — R079 Chest pain, unspecified: Secondary | ICD-10-CM | POA: Diagnosis present

## 2021-07-27 LAB — ECHOCARDIOGRAM COMPLETE
Area-P 1/2: 3.27 cm2
S' Lateral: 3.3 cm

## 2021-07-29 ENCOUNTER — Other Ambulatory Visit: Payer: Self-pay | Admitting: Family Medicine

## 2021-08-01 ENCOUNTER — Ambulatory Visit (INDEPENDENT_AMBULATORY_CARE_PROVIDER_SITE_OTHER): Payer: 59 | Admitting: Obstetrics and Gynecology

## 2021-08-01 ENCOUNTER — Other Ambulatory Visit: Payer: Self-pay | Admitting: Family Medicine

## 2021-08-01 ENCOUNTER — Encounter: Payer: Self-pay | Admitting: Obstetrics and Gynecology

## 2021-08-01 VITALS — BP 122/66 | HR 70 | Ht 65.0 in | Wt 220.0 lb

## 2021-08-01 DIAGNOSIS — I2510 Atherosclerotic heart disease of native coronary artery without angina pectoris: Secondary | ICD-10-CM

## 2021-08-01 DIAGNOSIS — Z01419 Encounter for gynecological examination (general) (routine) without abnormal findings: Secondary | ICD-10-CM

## 2021-08-01 NOTE — Patient Instructions (Signed)

## 2021-08-01 NOTE — Progress Notes (Signed)
High risk for ASCVD given family history and no CP at baseline or w/ exertion.  ?

## 2021-08-03 ENCOUNTER — Ambulatory Visit (HOSPITAL_BASED_OUTPATIENT_CLINIC_OR_DEPARTMENT_OTHER): Payer: 59 | Admitting: Obstetrics & Gynecology

## 2021-08-03 ENCOUNTER — Ambulatory Visit: Payer: 59

## 2021-08-17 ENCOUNTER — Ambulatory Visit
Admission: RE | Admit: 2021-08-17 | Discharge: 2021-08-17 | Disposition: A | Discharge status: Home / Self Care | Payer: 59 | Source: Ambulatory Visit | Attending: Family Medicine | Admitting: Family Medicine

## 2021-08-17 DIAGNOSIS — Z1231 Encounter for screening mammogram for malignant neoplasm of breast: Secondary | ICD-10-CM

## 2021-08-21 ENCOUNTER — Other Ambulatory Visit: Payer: Self-pay | Admitting: Family Medicine

## 2021-08-21 DIAGNOSIS — R928 Other abnormal and inconclusive findings on diagnostic imaging of breast: Secondary | ICD-10-CM

## 2021-08-25 ENCOUNTER — Ambulatory Visit
Admission: RE | Admit: 2021-08-25 | Discharge: 2021-08-25 | Disposition: A | Discharge status: Home / Self Care | Payer: 59 | Source: Ambulatory Visit | Attending: Family Medicine | Admitting: Family Medicine

## 2021-08-25 ENCOUNTER — Ambulatory Visit: Admission: RE | Admit: 2021-08-25 | Payer: 59 | Source: Ambulatory Visit

## 2021-08-25 DIAGNOSIS — R928 Other abnormal and inconclusive findings on diagnostic imaging of breast: Secondary | ICD-10-CM

## 2021-08-28 ENCOUNTER — Telehealth (HOSPITAL_BASED_OUTPATIENT_CLINIC_OR_DEPARTMENT_OTHER): Payer: Self-pay | Admitting: Obstetrics & Gynecology

## 2021-08-28 ENCOUNTER — Other Ambulatory Visit: Payer: Self-pay | Admitting: Psychiatry

## 2021-08-28 DIAGNOSIS — F5105 Insomnia due to other mental disorder: Secondary | ICD-10-CM

## 2021-08-28 NOTE — Telephone Encounter (Signed)
Patient called would asking for the notes be change about her being a smoker. ?

## 2021-09-01 ENCOUNTER — Other Ambulatory Visit: Payer: 59

## 2021-09-04 ENCOUNTER — Ambulatory Visit
Admission: RE | Admit: 2021-09-04 | Discharge: 2021-09-04 | Disposition: A | Discharge status: Home / Self Care | Payer: No Typology Code available for payment source | Source: Ambulatory Visit | Attending: Family Medicine | Admitting: Family Medicine

## 2021-09-04 DIAGNOSIS — I2510 Atherosclerotic heart disease of native coronary artery without angina pectoris: Secondary | ICD-10-CM

## 2021-09-28 ENCOUNTER — Ambulatory Visit (INDEPENDENT_AMBULATORY_CARE_PROVIDER_SITE_OTHER): Payer: 59 | Admitting: Psychiatry

## 2021-09-28 ENCOUNTER — Ambulatory Visit: Payer: 59 | Admitting: Orthopaedic Surgery

## 2021-09-28 ENCOUNTER — Encounter: Payer: Self-pay | Admitting: Psychiatry

## 2021-09-28 DIAGNOSIS — Z8659 Personal history of other mental and behavioral disorders: Secondary | ICD-10-CM | POA: Diagnosis not present

## 2021-09-28 DIAGNOSIS — F411 Generalized anxiety disorder: Secondary | ICD-10-CM | POA: Diagnosis not present

## 2021-09-28 DIAGNOSIS — I2510 Atherosclerotic heart disease of native coronary artery without angina pectoris: Secondary | ICD-10-CM

## 2021-09-28 DIAGNOSIS — F5105 Insomnia due to other mental disorder: Secondary | ICD-10-CM

## 2021-09-28 DIAGNOSIS — F341 Dysthymic disorder: Secondary | ICD-10-CM | POA: Diagnosis not present

## 2021-09-28 MED ORDER — BUSPIRONE HCL 15 MG PO TABS: 15.0000 mg | ORAL_TABLET | Freq: Two times a day (BID) | ORAL | 2 refills | Status: DC

## 2021-09-28 NOTE — Progress Notes (Signed)
[name] [id number] [birth year] 66 y.o.     Subjective:   Patient ID:  Bridget Joyce is a 66 y.o. (DOB 1955-07-25) female.  Chief Complaint:  Chief Complaint  Patient presents with   Follow-up   Anxiety   Depression   Stress    HPI Bridget Joyce presents  today for follow-up of anxiety and mood.  visit December 2020.  She wanted to wean off the citalopram to see if she can lose weight.  08/11/19 appt with the following noted: She called August 03, 2019 wanting to restart an antidepressant.  Her favorite cat had died.  She stated she felt the worst she ever had in her life.  She was having crying spells, depression, and insomnia along with a lot of anxiety with somatic symptoms and prominent guilt feelings.  As we had discussed before an alternative to citalopram was Lexapro she wanted to start the medication. Prescription sent for Lexapro 10 mg every evening. She started Lexapro at 5 mg daily.  Tolerated.  No effect so far.  A lot of guilt feelings over the loss of the cat. Functioning OK at work.  Prefers not to be alone initially but better some. Has supportive friends and sister. Plan: Start Lexapro 10 mg daily.  12/29/19 appt with the following noted: Increased to 10 mg Lexapro and felt better.  Trying to stop it now.  I felt better now re: cat.  Afraid that it can increase her appetite and wants to wean. Back down to 5 mg Lexapro for 2-3 weeks or so and now down to 1/4 tablet for a month..   Patient reports stable mood and denies depressed or irritable moods.  Patient denies any recent difficulty with more than mild anxiety.  Patient denies difficulty with sleep initiation or maintenance. Denies appetite disturbance.  Patient reports that energy and motivation have been good.  Patient denies any difficulty with concentration.  Patient denies any suicidal ideation.  Plan: She wants to wean Lexapro in hopes of losing weight.  Disc risk of relapse. Disc dosing and effect.   She will stop it.  09/29/2020 appointment with the following noted: Did not lose weight off Lexapro. Overall doing well since here.  Feels about same off Lexapro.  Problems with [employer] today was frustrating. Poor customer service.   Anxiety level is manageable generally.  Not markedly depressed. Taking only buspirone 15 mg HS bc dizzy if takes in the am. Wants prn alprazolam for sleep. Patient reports stable mood and denies depressed or irritable moods.  Patient denies any recent difficulty with anxiety.  Patient denies difficulty with sleep initiation or maintenance. Denies appetite disturbance.  Patient reports that energy and motivation have been good.  Patient denies any difficulty with concentration.  Patient denies any suicidal ideation. Plan: She didn't lose weight off Lexapro but does not feel any worse either. She will plan to increase the buspirone to 30 mg nightly because she gets dizzy if she splits the dose.  09/28/21 appt noted: Buspirone makes her a little dizzy in the morning so usually takes it only at night. Some problems with work load bc not keeping the help needed.  It was a shock.  Lost an employee that wasn't replaced.  Made her want to quit.  Doesn't feel she was respected enough.  Disc poss of retirement and what she would do.  Worked FT her whole life.  Full retirement age next year at 62 and 57 mos. Worked there 56  years. Not markedly depressed.  But has been more stressed. Sleep good.  Past Psychiatric Medication Trials:  Wellbutrin anxiety, sertraline tremor, Paxil sweats, citalopram, Trintellix, fluoxetine, Effexor 150, mirtazapine, duloxetine buspirone, Xanax, propranolol, Lyrica, gabapentin,  Adderall,   Review of Systems:  Review of Systems  Respiratory:  Negative for shortness of breath.   Cardiovascular:  Negative for chest pain and palpitations.  Musculoskeletal:  Positive for arthralgias.  Neurological:  Positive for headaches. Negative for dizziness and  tremors.  Psychiatric/Behavioral:  Negative for agitation, behavioral problems, confusion, decreased concentration, dysphoric mood, hallucinations, self-injury, sleep disturbance and suicidal ideas. The patient is not nervous/anxious and is not hyperactive.    Medications: I have reviewed the patient's current medications.  Current Outpatient Medications  Medication Sig Dispense Refill   albuterol (VENTOLIN HFA) 108 (90 Base) MCG/ACT inhaler Inhale 2 puffs into the lungs every 6 (six) hours as needed for wheezing or shortness of breath. 3 each 3   ALPRAZolam (XANAX) 0.5 MG tablet TAKE 1 TABLET BY MOUTH 2 TIMES DAILY AS NEEDED FOR ANXIETY. 60 tablet 1   Ascorbic Acid (VITAMIN C PO) Take 1,000 mg by mouth daily.      CALCIUM PO Take 600 mg by mouth daily. Citracal     fluticasone (FLONASE) 50 MCG/ACT nasal spray Place 1 spray into both nostrils daily.     Magnesium 400 MG CAPS Take 400 mg by mouth daily.      Multiple Vitamin (MULTIVITAMIN) tablet Take 1 tablet by mouth daily.     QVAR REDIHALER 80 MCG/ACT inhaler TAKE 1 PUFF BY MOUTH EVERY DAY 31.8 g 3   zinc gluconate 50 MG tablet Take 50 mg by mouth daily.     busPIRone (BUSPAR) 15 MG tablet Take 1 tablet (15 mg total) by mouth 2 (two) times daily. 180 tablet 2   No current facility-administered medications for this visit.    Medication Side Effects: None  Allergies:  Allergies  Allergen Reactions   [place] Grass Extract Itching   Grass Pollen(K-O-R-T-Swt Vern)    Mold Extract  [Trichophyton] Itching   Molds & Smuts    Other Other (See Comments)    Dust mite/ unknown   Short Ragweed Pollen Ext     Past Medical History:  Diagnosis Date   Allergy    rag weed, pollen,dust   Ankle fracture 12/2017   Left    Anxiety    mild; infrequent per patient   Asthma    Bursitis of hip    Diverticulosis    External hemorrhoids    Fibroid    Fibromyalgia    Migraine    Ocular   Obesity    Peripheral edema    infrequent per  patient   Pre-diabetes    Vitamin D deficiency     Family History  Problem Relation Age of Onset   Hypertension Father    Heart disease Father    Diabetes Father    Heart failure Father        32 death   Uterine cancer Sister    Diabetes Sister    Stroke Mother        87 death- hemorrhagic   Healthy Brother        other than smoker   Hypertension Sister    Colon cancer Neg Hx    Esophageal cancer Neg Hx    Stomach cancer Neg Hx    Rectal cancer Neg Hx     Social History   Socioeconomic History  Marital status: Single    Spouse name: Not on file   Number of children: Not on file   Years of education: Not on file   Highest education level: Not on file  Occupational History   Occupation: regulatory affairs    Employer: [employer]  Tobacco Use   Smoking status: Never   Smokeless tobacco: Never  Vaping Use   Vaping Use: Never used  Substance and Sexual Activity   Alcohol use: No   Drug use: No   Sexual activity: Not Currently    Birth control/protection: Abstinence, Surgical  Other Topics Concern   Not on file  Social History Narrative   Lives alone with 2 cats. Never Married. No kids.       Works for [employer] for 29 years in 2022.       Hobbies: time with friends, enjoy [employer] (has been there since she was a child- perhaps 6 months), time with family- 2 sisters and a brother      Advanced directives: full code, has Living will, HCPOA- older sister Alwyn Pea (on file after hip replacement)   Social Determinants of Health   Financial Resource Strain: Not on file  Food Insecurity: Not on file  Transportation Needs: Not on file  Physical Activity: Not on file  Stress: Not on file  Social Connections: Not on file  Intimate Partner Violence: Not on file    Past Medical History, Surgical history, Social history, and Family history were reviewed and updated as appropriate.   Please see review of systems for further details on the patient's review  from today.   Objective:   Physical Exam:  LMP 10/14/2000   Physical Exam Constitutional:      General: She is not in acute distress.    Appearance: She is well-developed. She is obese.  Musculoskeletal:        General: No deformity.  Neurological:     Mental Status: She is alert and oriented to person, place, and time.     Motor: No tremor.     Coordination: Coordination normal.     Gait: Gait normal.  Psychiatric:        Attention and Perception: Attention and perception normal.        Mood and Affect: Mood is anxious. Mood is not depressed. Affect is not labile, blunt, angry or inappropriate.        Speech: Speech normal.        Behavior: Behavior normal.        Thought Content: Thought content normal. Thought content does not include homicidal or suicidal ideation. Thought content does not include homicidal or suicidal plan.        Cognition and Memory: Cognition normal.        Judgment: Judgment normal.     Comments: Insight intact. No auditory or visual hallucinations. No delusions.      Lab Review:     Component Value Date/Time   NA 140 04/13/2020 0302   K 4.5 04/13/2020 0302   CL 106 04/13/2020 0302   CO2 27 04/13/2020 0302   GLUCOSE 159 (H) 04/13/2020 0302   BUN 16 04/13/2020 0302   CREATININE 0.80 04/13/2020 0302   CREATININE 0.97 03/07/2016 1651   CALCIUM 8.9 04/13/2020 0302   PROT 7.2 03/07/2016 1651   ALBUMIN 4.6 03/07/2016 1651   AST 24 03/07/2016 1651   ALT 26 03/07/2016 1651   ALKPHOS 97 03/07/2016 1651   BILITOT 0.5 03/07/2016 1651   GFRNONAA >60  04/13/2020 0302   GFRNONAA 64 03/07/2016 1651   GFRAA 74 03/07/2016 1651       Component Value Date/Time   WBC 10.8 (H) 04/13/2020 0302   RBC 3.56 (L) 04/13/2020 0302   HGB 11.1 (L) 04/13/2020 0302   HGB 13.1 02/26/2020 1325   HCT 33.7 (L) 04/13/2020 0302   HCT 40.1 02/26/2020 1325   PLT 285 04/13/2020 0302   PLT 310 02/26/2020 1325   MCV 94.7 04/13/2020 0302   MCV 96 02/26/2020 1325   MCH  31.2 04/13/2020 0302   MCHC 32.9 04/13/2020 0302   RDW 12.3 04/13/2020 0302   RDW 12.7 02/26/2020 1325   LYMPHSABS 2.1 02/26/2020 1325   MONOABS 518 03/07/2016 1651   EOSABS 0.2 02/26/2020 1325   BASOSABS 0.0 02/26/2020 1325    No results found for: POCLITH, LITHIUM   No results found for: PHENYTOIN, PHENOBARB, VALPROATE, CBMZ   .res Assessment: Plan:    Generalized anxiety disorder - Plan: busPIRone (BUSPAR) 15 MG tablet  Dysthymia - Plan: busPIRone (BUSPAR) 15 MG tablet  Insomnia due to mental condition  History of major depression    Greater than 50% of 30 min face to face time with patient was spent on counseling and coordination of care and grief counseling.   Disc stress at work with less help and loss of help and feeling disrespected.  Triggered thoughts about retirement next year.  Disc in detail retirement and mental health and being healthy in retirement. Plans it by November 2024.     Disc mechanism of action of SSRI vs buspirone.   She didn't lose weight off Lexapro but does not feel any worse either.  Some more anxiety with stress causing chest tightness with normal workup.  She will plan to continue the buspirone to 30 mg nightly because she gets dizzy if she splits the dose. Options to tweak the dose.  As can been seen before above she has tried multiple other psych meds.  Disc SE.    Rare alprazolam. We discussed the short-term risks associated with benzodiazepines including sedation and increased fall risk among others.  Discussed long-term side effect risk including dependence, potential withdrawal symptoms, and the potential eventual dose-related risk of dementia.  But recent studies from 2020 dispute this association between benzodiazepines and dementia risk. Newer studies in 2020 do not support an association with dementia.  FU 6 mos  Lynder Parents, MD, DFAPA   Please see After Visit Summary for patient specific instructions.  Future Appointments   Date Time Provider [place]  10/12/2021  3:45 PM Mcarthur Rossetti, MD [facility] None  12/25/2021  1:00 PM Marin Olp, MD [facility]    No orders of the defined types were placed in this encounter.     -------------------------------

## 2021-10-11 ENCOUNTER — Other Ambulatory Visit: Payer: Self-pay | Admitting: Family Medicine

## 2021-10-11 DIAGNOSIS — R079 Chest pain, unspecified: Secondary | ICD-10-CM

## 2021-10-12 ENCOUNTER — Ambulatory Visit: Payer: 59 | Admitting: Orthopaedic Surgery

## 2021-10-12 ENCOUNTER — Ambulatory Visit (INDEPENDENT_AMBULATORY_CARE_PROVIDER_SITE_OTHER): Payer: 59

## 2021-10-12 ENCOUNTER — Encounter: Payer: Self-pay | Admitting: Orthopaedic Surgery

## 2021-10-12 DIAGNOSIS — Z96642 Presence of left artificial hip joint: Secondary | ICD-10-CM | POA: Diagnosis not present

## 2021-10-12 NOTE — Progress Notes (Signed)
Patient is a very pleasant 66 year old female who actually appears younger than her stated age.  We replaced her left hip in December 2021.  Recently she was kneeling down and getting something out from under her desk and felt some pain in her left hip.  She would really like to have this checked out.  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  On exam her left hip moves smoothly and fluidly.  She does have pain over the trochanteric area consistent with trochanteric bursitis.  An AP pelvis and lateral left hip shows that the hip replacement is well-seated with no evidence of loosening or complicating features.  The offset is normal as well.  This gave her reassurance.  Follow-up for her hip can be as needed.  I did show her some stretching exercises for the bursitis and she demonstrated these back to me.  She has been dealing with some right chronic knee issues and if this becomes worrisome for her or more problematic she will come see [place].

## 2021-10-16 ENCOUNTER — Telehealth: Payer: Self-pay | Admitting: Psychiatry

## 2021-10-16 NOTE — Telephone Encounter (Signed)
Noted I will contact her.

## 2021-10-16 NOTE — Telephone Encounter (Signed)
Next visit is 04/12/22. Bridget Joyce would like to speak to a nurse about starting back on either Celexa or Zoloft. She's asking what the side effects are, the difference in both of these and which one would beneficial her the most? Her phone number is [phone]. Pharmacy is:  [id number] , [place] - [address] Phone:  [phone] Fax:  [phone]

## 2021-10-16 NOTE — Telephone Encounter (Signed)
She was just seen on 09/28/21, noted she went off of Lexapro due to weight issues but it didn't change. Asking to restart Lexapro or Zoloft? Not sure if that was discussed a couple weeks ago or not? Recommendation for her?

## 2021-10-16 NOTE — Telephone Encounter (Signed)
Can you please call pt for dr cottle

## 2021-10-17 NOTE — Telephone Encounter (Signed)
Left pt message to call back

## 2021-10-17 NOTE — Telephone Encounter (Signed)
Please talk to her and ask what symptoms she is having that make her want to go back on an SSRI.  I assume it is anxiety but it just needs to be documented.  She told me at the last recent appointment that she had stopped Lexapro and did not really feel any different off of it.  Therefore my suggestion is that she retry sertraline.  She had some tremors when she took it before but that is probably because we push the dose a little too high.  We will keep the dose low and it actually has a better antianxiety effect and does Lexapro.  She did not have any other side effects with sertraline when she took it in the past. If she agrees send in prescription for sertraline 50 mg tablets 1/2 tablet daily for 10 days then 1 tablet daily #30 and 1 refill  If she agrees to the plan she needs to move an appointment up with me and schedule in about 2 months.  The improvement will be gradual but clearly noticeable by 1 month but improved further in the second month

## 2021-10-18 NOTE — Telephone Encounter (Signed)
If she would prefer to resume citalopram instead of retrying sertraline, that is fine.  She can start one half citalopram daily for 1 week and then resume 1 tablet daily. She has a prescription for Xanax to use in the meantime until she starts getting benefit from citalopram in 3 to 4 weeks.

## 2021-10-18 NOTE — Telephone Encounter (Signed)
Pt called back and reported to [place] she wants medication for anxiety, says it is 5-6/10, she just feels reved up.   Dr. Clovis Pu recommended sertraline, but patient mentioned the hand shaking and weight gain and wants to go back on citalopram. She said she still has medication left from previous Rx (20 mg).   Would you want her to take 1/2 tablet (10 mg) of Citalopram instead of 20 mg? Anything for anxiety?

## 2021-10-19 NOTE — Telephone Encounter (Signed)
Patient notified of recommendations. 

## 2021-11-30 ENCOUNTER — Ambulatory Visit (INDEPENDENT_AMBULATORY_CARE_PROVIDER_SITE_OTHER): Payer: 59 | Admitting: Psychiatry

## 2021-11-30 ENCOUNTER — Encounter: Payer: Self-pay | Admitting: Psychiatry

## 2021-11-30 DIAGNOSIS — I2510 Atherosclerotic heart disease of native coronary artery without angina pectoris: Secondary | ICD-10-CM

## 2021-11-30 DIAGNOSIS — F411 Generalized anxiety disorder: Secondary | ICD-10-CM | POA: Diagnosis not present

## 2021-11-30 DIAGNOSIS — F5105 Insomnia due to other mental disorder: Secondary | ICD-10-CM

## 2021-11-30 DIAGNOSIS — F341 Dysthymic disorder: Secondary | ICD-10-CM | POA: Diagnosis not present

## 2021-11-30 DIAGNOSIS — Z8659 Personal history of other mental and behavioral disorders: Secondary | ICD-10-CM

## 2021-11-30 MED ORDER — FLUOXETINE HCL 20 MG PO CAPS: 20.0000 mg | ORAL_CAPSULE | Freq: Every day | ORAL | 0 refills | Status: DC

## 2021-11-30 NOTE — Progress Notes (Signed)
[name] [id number] [birth year] 66 y.o.     Subjective:   Patient ID:  Bridget Joyce is a 66 y.o. (DOB [birth year]) female.  Chief Complaint:  Chief Complaint  Patient presents with   Follow-up   Anxiety   Depression    HPI Bridget Joyce presents  today for follow-up of anxiety and mood.  visit December 2020.  She wanted to wean off the citalopram to see if she can lose weight.  08/11/19 appt with the following noted: She called August 03, 2019 wanting to restart an antidepressant.  Her favorite cat had died.  She stated she felt the worst she ever had in her life.  She was having crying spells, depression, and insomnia along with a lot of anxiety with somatic symptoms and prominent guilt feelings.  As we had discussed before an alternative to citalopram was Lexapro she wanted to start the medication. Prescription sent for Lexapro 10 mg every evening. She started Lexapro at 5 mg daily.  Tolerated.  No effect so far.  A lot of guilt feelings over the loss of the cat. Functioning OK at work.  Prefers not to be alone initially but better some. Has supportive friends and sister. Plan: Start Lexapro 10 mg daily.  12/29/19 appt with the following noted: Increased to 10 mg Lexapro and felt better.  Trying to stop it now.  I felt better now re: cat.  Afraid that it can increase her appetite and wants to wean. Back down to 5 mg Lexapro for 2-3 weeks or so and now down to 1/4 tablet for a month..   Patient reports stable mood and denies depressed or irritable moods.  Patient denies any recent difficulty with more than mild anxiety.  Patient denies difficulty with sleep initiation or maintenance. Denies appetite disturbance.  Patient reports that energy and motivation have been good.  Patient denies any difficulty with concentration.  Patient denies any suicidal ideation.  Plan: She wants to wean Lexapro in hopes of losing weight.  Disc risk of relapse. Disc dosing and effect.  She will  stop it.  09/29/2020 appointment with the following noted: Did not lose weight off Lexapro. Overall doing well since here.  Feels about same off Lexapro.  Problems with [employer] today was frustrating. Poor customer service.   Anxiety level is manageable generally.  Not markedly depressed. Taking only buspirone 15 mg HS bc dizzy if takes in the am. Wants prn alprazolam for sleep. Patient reports stable mood and denies depressed or irritable moods.  Patient denies any recent difficulty with anxiety.  Patient denies difficulty with sleep initiation or maintenance. Denies appetite disturbance.  Patient reports that energy and motivation have been good.  Patient denies any difficulty with concentration.  Patient denies any suicidal ideation. Plan: She didn't lose weight off Lexapro but does not feel any worse either. She will plan to increase the buspirone to 30 mg nightly because she gets dizzy if she splits the dose.  09/28/21 appt noted: Buspirone makes her a little dizzy in the morning so usually takes it only at night. Some problems with work load bc not keeping the help needed.  It was a shock.  Lost an employee that wasn't replaced.  Made her want to quit.  Doesn't feel she was respected enough.  Disc poss of retirement and what she would do.  Worked [employer] her whole life.  Full retirement age next year at 68 and 57 mos. Worked there 30 years. Not markedly  depressed.  But has been more stressed. Sleep good. Plan: She didn't lose weight off Lexapro but does not feel any worse either. She will plan to continue the buspirone to 30 mg nightly because she gets dizzy if she splits the dose. Options to tweak the dose.  10/15/2021 phone call wanting to restart an SSRI. The response:Please talk to her and ask what symptoms she is having that make her want to go back on an SSRI.  I assume it is anxiety but it just needs to be documented.  She told me at the last recent appointment that she had stopped Lexapro and  did not really feel any different off of it.  Therefore my suggestion is that she retry sertraline.  She had some tremors when she took it before but that is probably because we push the dose a little too high.  We will keep the dose low and it actually has a better antianxiety effect and does Lexapro.  She did not have any other side effects with sertraline when she took it in the past. If she agrees send in prescription for sertraline 50 mg tablets 1/2 tablet daily for 10 days then 1 tablet daily #30 and 1 refill  If she agrees to the plan she needs to move an appointment up with me and schedule in about 2 months.  The improvement will be gradual but clearly noticeable by 1 month but improved further in the second month 10/18/2021 follow-up phone call:If she would prefer to resume citalopram instead of retrying sertraline, that is fine.  She can start one half citalopram daily for 1 week and then resume 1 tablet daily. She has a prescription for Xanax to use in the meantime until she starts getting benefit from citalopram in 3 to 4 weeks.  11/30/2021 appointment with with the following noted: Current psych meds she is taking buspirone 15 mg only at bedtime, alprazolam 0.5 mg twice daily as needed anxiety Took citalopram 3 night and then stopped. Asks about Prozac.   Always got to be doing something.  Don't know how to stop unless night time.  SSRIs have helped with this some in the past. Sometimes a bit more depression than normal like when first awakens AM. No kids or spouse. Asks about different types SSRIS  Past Psychiatric Medication Trials:  Wellbutrin anxiety,  sertraline tremor, Paxil sweats, citalopram, Trintellix ? took, fluoxetine, Effexor 150,  duloxetine mirtazapine,  buspirone, Xanax, propranolol, Lyrica, gabapentin,  Adderall,  Took phentermine 1 month and helped  Review of Systems:  Review of Systems  Respiratory:  Negative for shortness of breath.   Cardiovascular:  Negative  for chest pain and palpitations.  Musculoskeletal:  Positive for arthralgias.  Neurological:  Positive for headaches. Negative for dizziness and tremors.  Psychiatric/Behavioral:  Positive for dysphoric mood. Negative for agitation, behavioral problems, confusion, decreased concentration, hallucinations, self-injury, sleep disturbance and suicidal ideas. The patient is nervous/anxious. The patient is not hyperactive.     Medications: I have reviewed the patient's current medications.  Current Outpatient Medications  Medication Sig Dispense Refill   albuterol (VENTOLIN HFA) 108 (90 Base) MCG/ACT inhaler Inhale 2 puffs into the lungs every 6 (six) hours as needed for wheezing or shortness of breath. 3 each 3   ALPRAZolam (XANAX) 0.5 MG tablet TAKE 1 TABLET BY MOUTH 2 TIMES DAILY AS NEEDED FOR ANXIETY. 60 tablet 1   Ascorbic Acid (VITAMIN C PO) Take 1,000 mg by mouth daily.      busPIRone (  BUSPAR) 15 MG tablet Take 1 tablet (15 mg total) by mouth 2 (two) times daily. (Patient taking differently: Take 15 mg by mouth 2 (two) times daily. 1 at bedtime) 180 tablet 2   CALCIUM PO Take 600 mg by mouth daily. Citracal     FLUoxetine (PROZAC) 20 MG capsule Take 1 capsule (20 mg total) by mouth daily. 90 capsule 0   fluticasone (FLONASE) 50 MCG/ACT nasal spray Place 1 spray into both nostrils daily.     Magnesium 400 MG CAPS Take 400 mg by mouth daily.      Multiple Vitamin (MULTIVITAMIN) tablet Take 1 tablet by mouth daily.     QVAR REDIHALER 80 MCG/ACT inhaler TAKE 1 PUFF BY MOUTH EVERY DAY 31.8 g 3   zinc gluconate 50 MG tablet Take 50 mg by mouth daily.     No current facility-administered medications for this visit.    Medication Side Effects: None  Allergies:  Allergies  Allergen Reactions   [place] Grass Extract Itching   Grass Pollen(K-O-R-T-Swt Vern)    Mold Extract  [Trichophyton] Itching   Molds & Smuts    Other Other (See Comments)    Dust mite/ unknown   Short Ragweed Pollen Ext      Past Medical History:  Diagnosis Date   Allergy    rag weed, pollen,dust   Ankle fracture 12/2017   Left    Anxiety    mild; infrequent per patient   Asthma    Bursitis of hip    Diverticulosis    External hemorrhoids    Fibroid    Fibromyalgia    Migraine    Ocular   Obesity    Peripheral edema    infrequent per patient   Pre-diabetes    Vitamin D deficiency     Family History  Problem Relation Age of Onset   Hypertension Father    Heart disease Father    Diabetes Father    Heart failure Father        53 death   Uterine cancer Sister    Diabetes Sister    Stroke Mother        53 death- hemorrhagic   Healthy Brother        other than smoker   Hypertension Sister    Colon cancer Neg Hx    Esophageal cancer Neg Hx    Stomach cancer Neg Hx    Rectal cancer Neg Hx     Social History   Socioeconomic History   Marital status: Single    Spouse name: Not on file   Number of children: Not on file   Years of education: Not on file   Highest education level: Not on file  Occupational History   Occupation: regulatory affairs    Employer: [employer]  Tobacco Use   Smoking status: Never   Smokeless tobacco: Never  Vaping Use   Vaping Use: Never used  Substance and Sexual Activity   Alcohol use: No   Drug use: No   Sexual activity: Not Currently    Birth control/protection: Abstinence, Surgical  Other Topics Concern   Not on file  Social History Narrative   Lives alone with 2 cats. Never Married. No kids.       Works for [employer] for 29 years in 2022.       Hobbies: time with friends, enjoy [employer] (has been there since she was a child- perhaps 6 months), time with family- 2 sisters and a brother  Advanced directives: full code, has Living will, HCPOA- older sister Alwyn Pea (on file after hip replacement)   Social Determinants of Health   Financial Resource Strain: Not on file  Food Insecurity: Not on file  Transportation Needs: Not on  file  Physical Activity: Not on file  Stress: Not on file  Social Connections: Not on file  Intimate Partner Violence: Not on file    Past Medical History, Surgical history, Social history, and Family history were reviewed and updated as appropriate.   Please see review of systems for further details on the patient's review from today.   Objective:   Physical Exam:  LMP 10/14/2000   Physical Exam Constitutional:      General: She is not in acute distress.    Appearance: She is well-developed. She is obese.  Musculoskeletal:        General: No deformity.  Neurological:     Mental Status: She is alert and oriented to person, place, and time.     Motor: No tremor.     Coordination: Coordination normal.     Gait: Gait normal.  Psychiatric:        Attention and Perception: Attention and perception normal.        Mood and Affect: Mood is anxious. Mood is not depressed. Affect is not labile, blunt, angry or inappropriate.        Speech: Speech normal.        Behavior: Behavior normal.        Thought Content: Thought content normal. Thought content is not delusional. Thought content does not include homicidal or suicidal ideation. Thought content does not include suicidal plan.        Cognition and Memory: Cognition normal.        Judgment: Judgment normal.     Comments: Insight intact. No auditory or visual hallucinations. No delusions.       Lab Review:     Component Value Date/Time   NA 140 04/13/2020 0302   K 4.5 04/13/2020 0302   CL 106 04/13/2020 0302   CO2 27 04/13/2020 0302   GLUCOSE 159 (H) 04/13/2020 0302   BUN 16 04/13/2020 0302   CREATININE 0.80 04/13/2020 0302   CREATININE 0.97 03/07/2016 1651   CALCIUM 8.9 04/13/2020 0302   PROT 7.2 03/07/2016 1651   ALBUMIN 4.6 03/07/2016 1651   AST 24 03/07/2016 1651   ALT 26 03/07/2016 1651   ALKPHOS 97 03/07/2016 1651   BILITOT 0.5 03/07/2016 1651   GFRNONAA >60 04/13/2020 0302   GFRNONAA 64 03/07/2016 1651    GFRAA 74 03/07/2016 1651       Component Value Date/Time   WBC 10.8 (H) 04/13/2020 0302   RBC 3.56 (L) 04/13/2020 0302   HGB 11.1 (L) 04/13/2020 0302   HGB 13.1 02/26/2020 1325   HCT 33.7 (L) 04/13/2020 0302   HCT 40.1 02/26/2020 1325   PLT 285 04/13/2020 0302   PLT 310 02/26/2020 1325   MCV 94.7 04/13/2020 0302   MCV 96 02/26/2020 1325   MCH 31.2 04/13/2020 0302   MCHC 32.9 04/13/2020 0302   RDW 12.3 04/13/2020 0302   RDW 12.7 02/26/2020 1325   LYMPHSABS 2.1 02/26/2020 1325   MONOABS 518 03/07/2016 1651   EOSABS 0.2 02/26/2020 1325   BASOSABS 0.0 02/26/2020 1325    No results found for: "POCLITH", "LITHIUM"   No results found for: "PHENYTOIN", "PHENOBARB", "VALPROATE", "CBMZ"   .res Assessment: Plan:    Generalized anxiety disorder - Plan: FLUoxetine (  PROZAC) 20 MG capsule  Dysthymia - Plan: FLUoxetine (PROZAC) 20 MG capsule  Insomnia due to mental condition  History of major depression    Greater than 50% of 30 min face to face time with patient was spent on counseling and coordination of care and grief counseling.   Disc stress at work with less help and loss of help and feeling disrespected.  Triggered thoughts about retirement next year.  Disc in detail retirement and mental health and being healthy in retirement. Plans it by November 2024.     Disc mechanism of action of SSRI vs buspirone.   She didn't lose weight off Lexapro but does feel some worse.  Disc option SSRI, Viibryd  Start fluoxetine 20 mg daily Disc SE in detail cp to other SSRIs  she wants to try it for anxiety and some dysthymia  Some more anxiety with stress causing chest tightness with normal workup.  She will plan to continue the buspirone to 15 mg nightly because she gets dizzy if she splits the dose. Options to tweak the dose.  As can been seen before above she has tried multiple other psych meds.  Disc SE.    Rare alprazolam. We discussed the short-term risks associated with  benzodiazepines including sedation and increased fall risk among others.  Discussed long-term side effect risk including dependence, potential withdrawal symptoms, and the potential eventual dose-related risk of dementia.  But recent studies from 2020 dispute this association between benzodiazepines and dementia risk. Newer studies in 2020 do not support an association with dementia.  Importance of activity in retirement.    FU2  mos  Lynder Parents, MD, DFAPA   Please see After Visit Summary for patient specific instructions.  Future Appointments  Date Time Provider [place]  12/25/2021  1:00 PM Marin Olp, MD [facility]  04/12/2022  1:00 PM Cottle, Billey Co., MD [facility] None    No orders of the defined types were placed in this encounter.     -------------------------------

## 2021-12-25 ENCOUNTER — Ambulatory Visit (INDEPENDENT_AMBULATORY_CARE_PROVIDER_SITE_OTHER): Payer: 59 | Admitting: Family Medicine

## 2021-12-25 ENCOUNTER — Encounter: Payer: Self-pay | Admitting: Family Medicine

## 2021-12-25 VITALS — BP 128/76 | HR 75 | Temp 98.1°F | Ht 65.0 in | Wt 223.2 lb

## 2021-12-25 DIAGNOSIS — E782 Mixed hyperlipidemia: Secondary | ICD-10-CM | POA: Diagnosis not present

## 2021-12-25 DIAGNOSIS — R7303 Prediabetes: Secondary | ICD-10-CM

## 2021-12-25 DIAGNOSIS — M25552 Pain in left hip: Secondary | ICD-10-CM

## 2021-12-25 DIAGNOSIS — Z23 Encounter for immunization: Secondary | ICD-10-CM | POA: Diagnosis not present

## 2021-12-25 DIAGNOSIS — Z1159 Encounter for screening for other viral diseases: Secondary | ICD-10-CM

## 2021-12-25 DIAGNOSIS — Z0000 Encounter for general adult medical examination without abnormal findings: Secondary | ICD-10-CM | POA: Diagnosis not present

## 2021-12-25 DIAGNOSIS — E559 Vitamin D deficiency, unspecified: Secondary | ICD-10-CM

## 2021-12-25 NOTE — Patient Instructions (Addendum)
Health Maintenance Due  Topic Date Due   INFLUENZA VACCINE -let us know when you have gotten your flu shot (high dose) 11/28/2021   doing well on pulmicort but only once a day- is going to trial off and if albuterol gets above 2x a week on average- restart pulmicort   Prevnar 20   [facility] contact Please call to schedule visit and/or procedure Address: [address], [place], [place] [address] Phone: [phone]   Recommended follow up: Return in about 1 year (around 12/26/2022) for physical or sooner if needed.Schedule b4 you leave.

## 2021-12-25 NOTE — Addendum Note (Signed)
Addended by: Clyde Lundborg A on: 12/25/2021 02:45 PM   Modules accepted: Orders

## 2021-12-25 NOTE — Progress Notes (Signed)
Phone [phone]   Subjective:  Patient presents today for their annual physical. Chief complaint-noted.   See problem oriented charting- ROS- full  review of systems was completed and negative except for: hip issues- bursitis issues at time  The following were reviewed and entered/updated in epic: Past Medical History:  Diagnosis Date   Allergy    rag weed, pollen,dust   Ankle fracture 12/2017   Left    Anxiety    mild; infrequent per patient   Asthma    Bursitis of hip    Diverticulosis    External hemorrhoids    Fibroid    Fibromyalgia    Migraine    Ocular   Obesity    Peripheral edema    infrequent per patient   Pre-diabetes    Vitamin D deficiency    Patient Active Problem List   Diagnosis Date Noted   Status post total replacement of left hip 04/12/2020    Priority: Medium    Fibromyalgia 10/29/2019    Priority: Medium    GAD (generalized anxiety disorder) 04/14/2018    Priority: Medium    Mixed hyperlipidemia 07/13/2014    Priority: Medium    Mild persistent asthma   12/19/2013    Priority: Medium    Prediabetes     Priority: Medium    Primary osteoarthritis of right knee 01/25/2017    Priority: Low   Vitamin D deficiency     Priority: Low   Myopia 10/28/2012    Priority: Low   GERD 05/26/2008    Priority: Low   Allergic rhinitis 05/20/2008    Priority: Low   Diverticulosis 09/02/2006    Priority: Low   Body mass index (BMI) 37.0-37.9, adult 05/19/2019   Past Surgical History:  Procedure Laterality Date   ABDOMINAL HYSTERECTOMY  2002   ovaries remain   BLEPHAROPLASTY Bilateral    COLONOSCOPY  09/02/2006   external hemorrhoids,diverticulosis   EYE SURGERY Left    adult stabismus   KNEE SURGERY     Left arthroscopic   LIPOMA EXCISION     Labial   MANDIBLE SURGERY     REPAIR ANKLE LIGAMENT Right 04/05/2014   x2   TONSILLECTOMY     TOTAL HIP ARTHROPLASTY Left 04/12/2020   Procedure: LEFT TOTAL HIP ARTHROPLASTY ANTERIOR APPROACH;   Surgeon: Mcarthur Rossetti, MD;  Location: [facility];  Service: Orthopedics;  Laterality: Left;    Family History  Problem Relation Age of Onset   Hypertension Father    Heart disease Father    Diabetes Father    Heart failure Father        66 death   Uterine cancer Sister    Diabetes Sister    Stroke Mother        78 death- hemorrhagic   Healthy Brother        other than smoker   Hypertension Sister    Colon cancer Neg Hx    Esophageal cancer Neg Hx    Stomach cancer Neg Hx    Rectal cancer Neg Hx     Medications- reviewed and updated Current Outpatient Medications  Medication Sig Dispense Refill   albuterol (VENTOLIN HFA) 108 (90 Base) MCG/ACT inhaler Inhale 2 puffs into the lungs every 6 (six) hours as needed for wheezing or shortness of breath. 3 each 3   ALPRAZolam (XANAX) 0.5 MG tablet TAKE 1 TABLET BY MOUTH 2 TIMES DAILY AS NEEDED FOR ANXIETY. 60 tablet 1   Ascorbic Acid (VITAMIN C PO) Take  1,000 mg by mouth daily.      busPIRone (BUSPAR) 15 MG tablet Take 1 tablet (15 mg total) by mouth 2 (two) times daily. (Patient taking differently: Take 15 mg by mouth 2 (two) times daily. 1 at bedtime) 180 tablet 2   CALCIUM PO Take 600 mg by mouth daily. Citracal     FLUoxetine (PROZAC) 20 MG capsule Take 1 capsule (20 mg total) by mouth daily. 90 capsule 0   fluticasone (FLONASE) 50 MCG/ACT nasal spray Place 1 spray into both nostrils daily.     Magnesium 400 MG CAPS Take 400 mg by mouth daily.      Multiple Vitamin (MULTIVITAMIN) tablet Take 1 tablet by mouth daily.     zinc gluconate 50 MG tablet Take 50 mg by mouth daily.     No current facility-administered medications for this visit.    Allergies-reviewed and updated Allergies  Allergen Reactions   [place] Grass Extract Itching   Grass Pollen(K-O-R-T-Swt Vern)    Mold Extract  [Trichophyton] Itching   Molds & Smuts    Other Other (See Comments)    Dust mite/ unknown   Short Ragweed Pollen Ext     Social  History   Social History Narrative   Lives alone with 2 cats. Never Married. No kids.       Works for [employer] for 29 years in 2022.       Hobbies: time with friends, enjoy [employer] (has been there since she was a child- perhaps 6 months), time with family- 2 sisters and a brother      Advanced directives: full code, has Living will, 50- older sister Physicist, medical (on file after hip replacement)   Objective  Objective:  BP 128/76   Pulse 75   Temp 98.1 F (36.7 C)   Ht '5\' 5"'$  (1.651 m)   Wt 223 lb 3.2 oz (101.2 kg)   LMP 10/14/2000   SpO2 97%   BMI 37.14 kg/m  Gen: NAD, resting comfortably HEENT: Mucous membranes are moist. Oropharynx normal Neck: no thyromegaly CV: RRR no murmurs rubs or gallops Lungs: CTAB no crackles, wheeze, rhonchi Abdomen: soft/nontender/nondistended/normal bowel sounds. No rebound or guarding.  Ext: no edema Skin: warm, dry Neuro: grossly normal, moves all extremities, PERRLA   Assessment and Plan   66 y.o. female presenting for annual physical.  Health Maintenance counseling: 1. Anticipatory guidance: Patient counseled regarding regular dental exams -q6 months, eye exams - yearly,  avoiding smoking and second hand smoke , limiting alcohol to 1 beverage per day , no illicit drugs .   2. Risk factor reduction:  Advised patient of need for regular exercise and diet rich and fruits and vegetables to reduce risk of heart attack and stroke.  Exercise- not at present- encouraged to start- goal long term 150 minutes a week.  Diet/weight management-discussed long term caloric deficit to help promote weight loss.  Wt Readings from Last 3 Encounters:  12/25/21 223 lb 3.2 oz (101.2 kg)  08/01/21 220 lb (99.8 kg)  03/09/21 208 lb (94.3 kg)  3. Immunizations/screenings/ancillary studies - fall flu shot. Consider covid shot in October.  Prevnar 20 today.  Immunization History  Administered Date(s) Administered   Influenza Split 01/28/2013, 03/14/2018    Influenza-Unspecified 04/22/2015, 02/20/2016, 02/05/2017, 01/29/2020   PFIZER(Purple Top)SARS-COV-2 Vaccination 07/28/2019, 08/19/2019, 03/12/2020   PPD Test 07/13/2014, 03/07/2016   Td 01/14/2003   Tdap 02/17/2013   Zoster Recombinat (Shingrix) 07/12/2017, 10/03/2017  4. Cervical cancer screening- 07/28/20 plus hysterectomy-  will be passed age based screening 5. Breast cancer screening-  breast exam with GYN and mammogram 08/17/21- ended up having diagnostic which was reassuring 6. Colon cancer screening - 01/08/12- will be due - she will call Dr. Carlean Purl back to get scheduled 7. Skin cancer screening- [facility]. advised regular sunscreen use.  8. Birth control/STD check- not sexually active. hysterecotmy 9. Osteoporosis screening at 57- normal in 2018 10. Smoking associated screening - never smoker  Status of chronic or acute concerns   #social update- took away significant help at work and that has been challenging- increased anxiety. Looking forward to retirement- we did not discuss specific date.   % GAD-follows with Dr. Clovis Pu of psychiatry and reports reasonable control but off medication- holding off for now on citalopram and fluoxetine 20 mg (through Dr. Clovis Pu) - some anxiety though and considering restarting.  Also on buspirone-sparing alprazolam in the past .  No SI     12/25/2021    1:10 PM 03/09/2021    1:12 PM 06/17/2020    2:40 PM  Depression screen PHQ 2/9  Decreased Interest 0 0 0  Down, Depressed, Hopeless 0 0 0  PHQ - 2 Score 0 0 0  Altered sleeping 0 0 0  Tired, decreased energy 0 0 0  Change in appetite 0 0 0  Feeling bad or failure about yourself  0 0 0  Trouble concentrating 0 0 0  Moving slowly or fidgety/restless 0 0 0  Suicidal thoughts 0 0 0  PHQ-9 Score 0 0 0  Difficult doing work/chores Not difficult at all Not difficult at all Not difficult at all   #hyperlipidemia S: Medication:None Lab Results  Component Value Date   CHOL 206 (H) 03/07/2016    HDL 71 03/07/2016   LDLCALC 119 (H) 03/07/2016   TRIG 82 03/07/2016   CHOLHDL 2.9 03/07/2016  A/P:  update lipid panel and ascvd risk. Actually had ct cardiac scoring 09/04/21 and score of 0- very reassuring- a few scattered aortic calcifications.  -feels could improve diet  #Fibromyalgia ?  S:Patient with history of feeling fatigue in her legs with exercise-has been told in the past could be related to underlying fibromyalgia.  Reassuring blood flow test in the past including ABI and angiogram was done with [facility]  -Also gets musculoskeletal pain including low back spasms and lateral hip pain-Flexeril was helpful when needed  A/P: overall stable-  uncertain diagnosis- monitor  # Asthma S: Maintenance Medication: pulmicort As needed medication: Albuterol. Patient is using this 0-1x per week. A/P: doing well on pulmicort but only once a day- is going to trial off and if albuterol gets above 2x a week on average- restart pulmicort    #Vitamin D deficiency S: Medication: 5000 units every 2 to 3 days.  Has required higher dose in the past  A/P: hopefully stable- update vitamin D today. Continue current meds for now     # Hyperglycemia/insulin resistance/prediabetes-peak A1c of 6.1 S:  Medication: none  A/P: hopefully stable or imrpoved - update a1c today. Continue current meds for now  # left hip pain/recurrent bursitis since age 41- wants to make sur eno associations with gout- check uric acid   Recommended follow up: Return in about 1 year (around 12/26/2022) for physical or sooner if needed.Schedule b4 you leave. Future Appointments  Date Time Provider Gideon  03/01/2022  1:30 PM Cottle, Billey Co., MD CP-CP None  04/12/2022  1:00 PM Cottle, Billey Co., MD CP-CP  None   Lab/Order associations: fasting   ICD-10-CM   1. Preventative health care  Z00.00 Hepatitis C antibody    CBC with Differential/Platelet    Comprehensive metabolic panel    Lipid panel    HgB A1c     VITAMIN D 25 Hydroxy (Vit-D Deficiency, Fractures)    Uric acid    TSH    2. Mixed hyperlipidemia  E78.2 CBC with Differential/Platelet    Comprehensive metabolic panel    Lipid panel    TSH    3. Prediabetes  R73.03 HgB A1c    4. Vitamin D deficiency  E55.9 VITAMIN D 25 Hydroxy (Vit-D Deficiency, Fractures)    5. Encounter for hepatitis C screening test for low risk patient  Z11.59 Hepatitis C antibody    6. Left hip pain  M25.552 Uric acid      No orders of the defined types were placed in this encounter.   Return precautions advised.  Garret Reddish, MD

## 2021-12-26 LAB — CBC WITH DIFFERENTIAL/PLATELET
Basophils Absolute: 0.1 10*3/uL (ref 0.0–0.1)
Basophils Relative: 1 % (ref 0.0–3.0)
Eosinophils Absolute: 0.2 10*3/uL (ref 0.0–0.7)
Eosinophils Relative: 2.6 % (ref 0.0–5.0)
HCT: 38.2 % (ref 36.0–46.0)
Hemoglobin: 12.7 g/dL (ref 12.0–15.0)
Lymphocytes Relative: 29.3 % (ref 12.0–46.0)
Lymphs Abs: 2.1 10*3/uL (ref 0.7–4.0)
MCHC: 33.3 g/dL (ref 30.0–36.0)
MCV: 94 fl (ref 78.0–100.0)
Monocytes Absolute: 0.4 10*3/uL (ref 0.1–1.0)
Monocytes Relative: 5.8 % (ref 3.0–12.0)
Neutro Abs: 4.3 10*3/uL (ref 1.4–7.7)
Neutrophils Relative %: 61.3 % (ref 43.0–77.0)
Platelets: 290 10*3/uL (ref 150.0–400.0)
RBC: 4.07 Mil/uL (ref 3.87–5.11)
RDW: 13 % (ref 11.5–15.5)
WBC: 7 10*3/uL (ref 4.0–10.5)

## 2021-12-26 LAB — COMPREHENSIVE METABOLIC PANEL
ALT: 24 U/L (ref 0–35)
AST: 24 U/L (ref 0–37)
Albumin: 4.3 g/dL (ref 3.5–5.2)
Alkaline Phosphatase: 97 U/L (ref 39–117)
BUN: 11 mg/dL (ref 6–23)
CO2: 27 mEq/L (ref 19–32)
Calcium: 9.5 mg/dL (ref 8.4–10.5)
Chloride: 106 mEq/L (ref 96–112)
Creatinine, Ser: 0.81 mg/dL (ref 0.40–1.20)
GFR: 76.14 mL/min (ref >60.00)
Glucose, Bld: 82 mg/dL (ref 70–99)
Potassium: 4.6 mEq/L (ref 3.5–5.1)
Sodium: 144 mEq/L (ref 135–145)
Total Bilirubin: 0.5 mg/dL (ref 0.2–1.2)
Total Protein: 7.2 g/dL (ref 6.0–8.3)

## 2021-12-26 LAB — HEMOGLOBIN A1C: Hgb A1c MFr Bld: 6.5 % (ref 4.6–6.5)

## 2021-12-26 LAB — LIPID PANEL
Cholesterol: 178 mg/dL (ref 0–200)
HDL: 65.2 mg/dL (ref >39.00)
LDL Cholesterol: 96 mg/dL (ref 0–99)
NonHDL: 113.13
Total CHOL/HDL Ratio: 3
Triglycerides: 85 mg/dL (ref 0.0–149.0)
VLDL: 17 mg/dL (ref 0.0–40.0)

## 2021-12-26 LAB — HEPATITIS C ANTIBODY: Hepatitis C Ab: NONREACTIVE

## 2021-12-26 LAB — TSH: TSH: 1.73 u[IU]/mL (ref 0.35–5.50)

## 2021-12-26 LAB — URIC ACID: Uric Acid, Serum: 4.7 mg/dL (ref 2.4–7.0)

## 2021-12-26 LAB — VITAMIN D 25 HYDROXY (VIT D DEFICIENCY, FRACTURES): VITD: 36.43 ng/mL (ref 30.00–100.00)

## 2022-02-14 ENCOUNTER — Encounter: Payer: Self-pay | Admitting: Internal Medicine

## 2022-03-01 ENCOUNTER — Ambulatory Visit: Payer: Medicare Other | Admitting: Psychiatry

## 2022-03-02 ENCOUNTER — Other Ambulatory Visit: Payer: Self-pay

## 2022-03-02 ENCOUNTER — Ambulatory Visit (AMBULATORY_SURGERY_CENTER): Payer: Self-pay

## 2022-03-02 VITALS — Ht 65.0 in | Wt 215.0 lb

## 2022-03-02 DIAGNOSIS — Z1211 Encounter for screening for malignant neoplasm of colon: Secondary | ICD-10-CM

## 2022-03-02 NOTE — Progress Notes (Signed)
Denies allergies to eggs or soy products. Denies complication of anesthesia or sedation. Denies use of weight loss medication. Denies use of O2.   Emmi instructions given for colonoscopy.  

## 2022-03-09 ENCOUNTER — Encounter: Payer: Self-pay | Admitting: Internal Medicine

## 2022-03-09 ENCOUNTER — Ambulatory Visit (INDEPENDENT_AMBULATORY_CARE_PROVIDER_SITE_OTHER): Payer: 59 | Admitting: Internal Medicine

## 2022-03-09 VITALS — BP 116/79 | HR 78 | Temp 98.0°F | Ht 65.0 in | Wt 224.8 lb

## 2022-03-09 DIAGNOSIS — H6992 Unspecified Eustachian tube disorder, left ear: Secondary | ICD-10-CM

## 2022-03-09 MED ORDER — FLUTICASONE PROPIONATE 50 MCG/ACT NA SUSP: 2.0000 | Freq: Every day | NASAL | 6 refills | Status: DC

## 2022-03-09 NOTE — Patient Instructions (Addendum)
It was a pleasure seeing you today! I truly hope you feel like you received 5 star service and please let me know if there is anything I can improve.  Loralee Pacas, MD   Today the plan is...use flonase after simplysaline to clear out ears.  There are no diagnoses linked to this encounter.       '[x]'$  RETURN TO CLINIC: No follow-ups on file.   - If you are not doing well: RETURN to the office sooner. - Please bring all your medicines to each appointment.  - If your condition begins to worsen or become severe:  GO to the ER.  '[x]'$  QUESTIONS/CONCERNS:  If you have follow-up questions / concerns:  - CLINICAL: please contact me via phone [phone] OR MyChart messaging  - [place] you will be contacted with the lab results as soon as they are available. For any labs or imaging tests, we will call you if the results are significantly abnormal.  Most normal results will be posted to myChart as soon as they are available and I will comment on them there within 2-3 business days.  The fastest way to get your results is to activate your My Chart account. Instructions are located on the last page of this paperwork. If you have not heard from [place] regarding the results in 2 weeks, please contact this office.  - BILLING: xray and lab orders are billed from separate companies and questions./concerns should be directed to the [place].  For visit charges please discuss with our administrative services

## 2022-03-09 NOTE — Progress Notes (Signed)
[place] at [facility]:  [id number]  Routine Medical Office Visit  Patient:  Bridget Joyce      Age: 66 y.o.       Sex:  female  Date:   03/10/2022  PCP:    Marin Olp, MD   [place] Provider: Loralee Pacas, MD  Assessment/Plan:   Ai was seen today for nasal congestion and left ear clogged.  Eustachian tube dysfunction, left -     Fluticasone Propionate; Place 2 sprays into both nostrils daily.  Dispense: 16 g; Refill: 6    Gave AVS handout Advised simplysaline rinse followed by flonase Offered antibiotics in case of left ear infection since the tm looked worse on that side and she had fullness but she declined- even was able to get Dr. Yong Channel to discuss the plan with me and patient.   Today's key discussion points - also in After Visit Summary (AVS) Common side effects, risks, benefits, and alternatives for medications and treatment plan prescribed today were discussed, and she expressed understanding of the given instructions.  Medication list was reconciled and patient instructions and summary information was documented and made available for her to review in the AVS (see AVS).  This note is also available to patient for review for accuracy and understanding. She was encouraged to contact our office by phone or message via MyChart if she has any questions or concerns regarding our treatment plan (see AVS).  We discussed red flag symptoms and signs in detail and when to call the office or go to ER if her condition worsens (see AFTER VISIT SUMMARY). She expressed understanding.  No barriers to understanding were identified     Subjective:   Bridget Joyce is a 66 y.o. female with PMH significant for: Past Medical History:  Diagnosis Date   Allergy    rag weed, pollen,dust   Ankle fracture 12/2017   Left    Anxiety    mild; infrequent per patient   Arthritis    Asthma    Bursitis of hip    Diverticulosis    External hemorrhoids     Fibroid    Fibromyalgia    Migraine    Ocular   Obesity    Peripheral edema    infrequent per patient   Pre-diabetes    Vitamin D deficiency      She is presenting today with: Chief Complaint  Patient presents with   Nasal Congestion    No fever. Woke up last Monday with this, was producing yellowish mucus (when coughing it up). Took mucinex DM, nyquil cold and flu, dayquil cold and flu, sudafed and allegra. Feeling much better. Done Covid testst last Wednesday and this week, both were negative.   Left ear clogged    Hearing a sound in ear intermittently at regular intervals, was painful but not currently.     Additional physician-collected and physician-reviewed history: See Assessment/Plan section for today's problem updates to charted history (under Overview: and Assessment & Plan for each problem)  Last weeek lots of throat and nose congestion Relevant pmh  asthma and ragweed pollen dust allergies Similar event last year She took sudafed and allegra last week Doesn't do sinus rinses. Has a little cough left, and a a little congestion comes up sometimes.            Objective:  Physical Exam: BP 116/79 (BP Location: Left Arm, Patient Position: Sitting)   Pulse 78   Temp 98 F (  36.7 C) (Temporal)   Ht '5\' 5"'$  (1.651 m)   Wt 224 lb 12.8 oz (102 kg)   LMP 10/14/2000   SpO2 99%   BMI 37.41 kg/m   She is a polite, friendly, and genuine person Constitutional: NAD, AAO, not ill-appearing  Neuro: alert, no focal deficit obvious, articulate speech Psych: normal mood, behavior, thought content  Problem-specific physical exam findings:  Sniffles, TM on left side looks worse than right, but no visualizable fluid behind it.  Pictures taken at today's visit: No images are attached to the encounter or orders placed in the encounter.    Results Reviewed:  No results found for any visits on 03/09/22.   Recent Results (from the past 2160 hour(s))  Hepatitis C antibody      Status: None   Collection Time: 12/25/21  2:30 PM  Result Value Ref Range   Hepatitis C Ab NON-REACTIVE NON-REACTIVE    Comment: . HCV antibody was non-reactive. There is no laboratory  evidence of HCV infection. . In most cases, no further action is required. However, if recent HCV exposure is suspected, a test for HCV RNA (test code [id number]) is suggested. . For additional information please refer to http://education.[facility].com/faq/FAQ22v1 (This link is being provided for informational/ educational purposes only.) .   CBC with Differential/Platelet     Status: None   Collection Time: 12/25/21  2:30 PM  Result Value Ref Range   WBC 7.0 4.0 - 10.5 K/uL   RBC 4.07 3.87 - 5.11 Mil/uL   Hemoglobin 12.7 12.0 - 15.0 g/dL   HCT 38.2 36.0 - 46.0 %   MCV 94.0 78.0 - 100.0 fl   MCHC 33.3 30.0 - 36.0 g/dL   RDW 13.0 11.5 - 15.5 %   Platelets 290.0 150.0 - 400.0 K/uL   Neutrophils Relative % 61.3 43.0 - 77.0 %   Lymphocytes Relative 29.3 12.0 - 46.0 %   Monocytes Relative 5.8 3.0 - 12.0 %   Eosinophils Relative 2.6 0.0 - 5.0 %   Basophils Relative 1.0 0.0 - 3.0 %   Neutro Abs 4.3 1.4 - 7.7 K/uL   Lymphs Abs 2.1 0.7 - 4.0 K/uL   Monocytes Absolute 0.4 0.1 - 1.0 K/uL   Eosinophils Absolute 0.2 0.0 - 0.7 K/uL   Basophils Absolute 0.1 0.0 - 0.1 K/uL  Comprehensive metabolic panel     Status: None   Collection Time: 12/25/21  2:30 PM  Result Value Ref Range   Sodium 144 135 - 145 mEq/L   Potassium 4.6 3.5 - 5.1 mEq/L   Chloride 106 96 - 112 mEq/L   CO2 27 19 - 32 mEq/L   Glucose, Bld 82 70 - 99 mg/dL   BUN 11 6 - 23 mg/dL   Creatinine, Ser 0.81 0.40 - 1.20 mg/dL   Total Bilirubin 0.5 0.2 - 1.2 mg/dL   Alkaline Phosphatase 97 39 - 117 U/L   AST 24 0 - 37 U/L   ALT 24 0 - 35 U/L   Total Protein 7.2 6.0 - 8.3 g/dL   Albumin 4.3 3.5 - 5.2 g/dL   GFR 76.14 >60.00 mL/min    Comment: Calculated using the CKD-EPI Creatinine Equation (2021)   Calcium 9.5 8.4 - 10.5 mg/dL   Lipid panel     Status: None   Collection Time: 12/25/21  2:30 PM  Result Value Ref Range   Cholesterol 178 0 - 200 mg/dL    Comment: ATP III Classification       Desirable:  <  200 mg/dL               Borderline High:  200 - 239 mg/dL          High:  > = 240 mg/dL   Triglycerides 85.0 0.0 - 149.0 mg/dL    Comment: Normal:  <150 mg/dLBorderline High:  150 - 199 mg/dL   HDL 65.20 >39.00 mg/dL   VLDL 17.0 0.0 - 40.0 mg/dL   LDL Cholesterol 96 0 - 99 mg/dL   Total CHOL/HDL Ratio 3     Comment:                Men          Women1/2 Average Risk     3.4          3.3Average Risk          5.0          4.42X Average Risk          9.6          7.13X Average Risk          15.0          11.0                       NonHDL 113.13     Comment: NOTE:  Non-HDL goal should be 30 mg/dL higher than patient's LDL goal (i.e. LDL goal of < 70 mg/dL, would have non-HDL goal of < 100 mg/dL)  HgB A1c     Status: None   Collection Time: 12/25/21  2:30 PM  Result Value Ref Range   Hgb A1c MFr Bld 6.5 4.6 - 6.5 %    Comment: Glycemic Control Guidelines for People with Diabetes:Non Diabetic:  <6%Goal of Therapy: <7%Additional Action Suggested:  >8%   VITAMIN D 25 Hydroxy (Vit-D Deficiency, Fractures)     Status: None   Collection Time: 12/25/21  2:30 PM  Result Value Ref Range   VITD 36.43 30.00 - 100.00 ng/mL  Uric acid     Status: None   Collection Time: 12/25/21  2:30 PM  Result Value Ref Range   Uric Acid, Serum 4.7 2.4 - 7.0 mg/dL  TSH     Status: None   Collection Time: 12/25/21  2:30 PM  Result Value Ref Range   TSH 1.73 0.35 - 5.50 uIU/mL

## 2022-03-13 ENCOUNTER — Encounter: Payer: Self-pay | Admitting: Internal Medicine

## 2022-03-20 ENCOUNTER — Encounter: Payer: 59 | Admitting: Internal Medicine

## 2022-04-12 ENCOUNTER — Ambulatory Visit (INDEPENDENT_AMBULATORY_CARE_PROVIDER_SITE_OTHER): Payer: 59 | Admitting: Psychiatry

## 2022-04-12 ENCOUNTER — Encounter: Payer: Self-pay | Admitting: Psychiatry

## 2022-04-12 DIAGNOSIS — F5105 Insomnia due to other mental disorder: Secondary | ICD-10-CM | POA: Diagnosis not present

## 2022-04-12 DIAGNOSIS — Z8659 Personal history of other mental and behavioral disorders: Secondary | ICD-10-CM

## 2022-04-12 DIAGNOSIS — M797 Fibromyalgia: Secondary | ICD-10-CM

## 2022-04-12 DIAGNOSIS — F411 Generalized anxiety disorder: Secondary | ICD-10-CM | POA: Diagnosis not present

## 2022-04-12 DIAGNOSIS — F341 Dysthymic disorder: Secondary | ICD-10-CM

## 2022-04-12 DIAGNOSIS — I2510 Atherosclerotic heart disease of native coronary artery without angina pectoris: Secondary | ICD-10-CM

## 2022-04-12 DIAGNOSIS — G9332 Myalgic encephalomyelitis/chronic fatigue syndrome: Secondary | ICD-10-CM

## 2022-04-12 MED ORDER — DULOXETINE HCL 20 MG PO CPEP: ORAL_CAPSULE | ORAL | 1 refills | Status: DC

## 2022-04-12 NOTE — Progress Notes (Signed)
Bridget Joyce [id number] [birth year] 66 y.o.     Subjective:   Patient ID:  Bridget Joyce is a 66 y.o. (DOB [birth year]) female.  Chief Complaint:  Chief Complaint  Patient presents with   Follow-up   Anxiety   Depression    HPI [name] presents  today for follow-up of anxiety and mood.  visit December 2020.  She wanted to wean off the citalopram to see if she can lose weight.  08/11/19 appt with the following noted: She called August 03, 2019 wanting to restart an antidepressant.  Her favorite cat had died.  She stated she felt the worst she ever had in her life.  She was having crying spells, depression, and insomnia along with a lot of anxiety with somatic symptoms and prominent guilt feelings.  As we had discussed before an alternative to citalopram was Lexapro she wanted to start the medication. Prescription sent for Lexapro 10 mg every evening. She started Lexapro at 5 mg daily.  Tolerated.  No effect so far.  A lot of guilt feelings over the loss of the cat. Functioning OK at work.  Prefers not to be alone initially but better some. Has supportive friends and sister. Plan: Start Lexapro 10 mg daily.  12/29/19 appt with the following noted: Increased to 10 mg Lexapro and felt better.  Trying to stop it now.  I felt better now re: cat.  Afraid that it can increase her appetite and wants to wean. Back down to 5 mg Lexapro for 2-3 weeks or so and now down to 1/4 tablet for a month..   Patient reports stable mood and denies depressed or irritable moods.  Patient denies any recent difficulty with more than mild anxiety.  Patient denies difficulty with sleep initiation or maintenance. Denies appetite disturbance.  Patient reports that energy and motivation have been good.  Patient denies any difficulty with concentration.  Patient denies any suicidal ideation.  Plan: She wants to wean Lexapro in hopes of losing weight.  Disc risk of relapse. Disc dosing and effect.  She will  stop it.  09/29/2020 appointment with the following noted: Did not lose weight off Lexapro. Overall doing well since here.  Feels about same off Lexapro.  Problems with [employer] today was frustrating. Poor customer service.   Anxiety level is manageable generally.  Not markedly depressed. Taking only buspirone 15 mg HS bc dizzy if takes in the am. Wants prn alprazolam for sleep. Patient reports stable mood and denies depressed or irritable moods.  Patient denies any recent difficulty with anxiety.  Patient denies difficulty with sleep initiation or maintenance. Denies appetite disturbance.  Patient reports that energy and motivation have been good.  Patient denies any difficulty with concentration.  Patient denies any suicidal ideation. Plan: She didn't lose weight off Lexapro but does not feel any worse either. She will plan to increase the buspirone to 30 mg nightly because she gets dizzy if she splits the dose.  09/28/21 appt noted: Buspirone makes her a little dizzy in the morning so usually takes it only at night. Some problems with work load bc not keeping the help needed.  It was a shock.  Lost an employee that wasn't replaced.  Made her want to quit.  Doesn't feel she was respected enough.  Disc poss of retirement and what she would do.  Worked [employer] her whole life.  Full retirement age next year at 68 and 57 mos. Worked there 30 years. Not markedly  depressed.  But has been more stressed. Sleep good. Plan: She didn't lose weight off Lexapro but does not feel any worse either. She will plan to continue the buspirone to 30 mg nightly because she gets dizzy if she splits the dose. Options to tweak the dose.  10/15/2021 phone call wanting to restart an SSRI. The response:Please talk to her and ask what symptoms she is having that make her want to go back on an SSRI.  I assume it is anxiety but it just needs to be documented.  She told me at the last recent appointment that she had stopped Lexapro and  did not really feel any different off of it.  Therefore my suggestion is that she retry sertraline.  She had some tremors when she took it before but that is probably because we push the dose a little too high.  We will keep the dose low and it actually has a better antianxiety effect and does Lexapro.  She did not have any other side effects with sertraline when she took it in the past. If she agrees send in prescription for sertraline 50 mg tablets 1/2 tablet daily for 10 days then 1 tablet daily #30 and 1 refill  If she agrees to the plan she needs to move an appointment up with me and schedule in about 2 months.  The improvement will be gradual but clearly noticeable by 1 month but improved further in the second month 10/18/2021 follow-up phone call:If she would prefer to resume citalopram instead of retrying sertraline, that is fine.  She can start one half citalopram daily for 1 week and then resume 1 tablet daily. She has a prescription for Xanax to use in the meantime until she starts getting benefit from citalopram in 3 to 4 weeks.  11/30/2021 appointment with with the following noted: Current psych meds she is taking buspirone 15 mg only at bedtime, alprazolam 0.5 mg twice daily as needed anxiety Took citalopram 3 night and then stopped. Asks about Prozac.   Always got to be doing something.  Don't know how to stop unless night time.  SSRIs have helped with this some in the past. Sometimes a bit more depression than normal like when first awakens AM. No kids or spouse. Asks about different types SSRIS Plan: Start fluoxetine 20 mg daily  04/12/22 appt noted: Took Prozac a few days and her legs would hurt at night.  Then stopped it. Aches happened with citalopram too.   Chronically stressed and eats junk food at night.  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  If pushes on leg muscles now then pain.  Chronic low exercise tolerance and muscular pain.    Past Psychiatric Medication Trials:  Wellbutrin anxiety,   sertraline tremor, Paxil sweats, citalopram, Trintellix ? took, fluoxetine legs hurt, Effexor 150,  duloxetine mirtazapine,  buspirone, Xanax, propranolol, Lyrica, gabapentin,  Adderall,  Took phentermine 1 month and helped  Review of Systems:  Review of Systems  Respiratory:  Negative for shortness of breath.   Cardiovascular:  Negative for palpitations.  Musculoskeletal:  Positive for arthralgias and myalgias.  Neurological:  Positive for headaches. Negative for dizziness and tremors.  Psychiatric/Behavioral:  Positive for dysphoric mood. Negative for agitation, behavioral problems, confusion, decreased concentration, hallucinations, self-injury, sleep disturbance and suicidal ideas. The patient is nervous/anxious. The patient is not hyperactive.     Medications: I have reviewed the patient's current medications.  Current Outpatient Medications  Medication Sig Dispense Refill   albuterol (VENTOLIN HFA)  108 (90 Base) MCG/ACT inhaler Inhale 2 puffs into the lungs every 6 (six) hours as needed for wheezing or shortness of breath. 3 each 3   ALPRAZolam (XANAX) 0.5 MG tablet TAKE 1 TABLET BY MOUTH 2 TIMES DAILY AS NEEDED FOR ANXIETY. 60 tablet 1   Ascorbic Acid (VITAMIN C PO) Take 1,000 mg by mouth daily.      busPIRone (BUSPAR) 15 MG tablet Take 1 tablet (15 mg total) by mouth 2 (two) times daily. (Patient taking differently: Take 15 mg by mouth 2 (two) times daily. 1 at bedtime) 180 tablet 2   CALCIUM PO Take 600 mg by mouth daily. Citracal     DULoxetine (CYMBALTA) 20 MG capsule 1 daily for 1 week, then 2 daily 60 capsule 1   fluticasone (FLONASE) 50 MCG/ACT nasal spray Place 2 sprays into both nostrils daily. 16 g 6   Magnesium 400 MG CAPS Take 400 mg by mouth daily.      Multiple Vitamin (MULTIVITAMIN) tablet Take 1 tablet by mouth daily.     polyethylene glycol powder (GLYCOLAX/MIRALAX) 17 GM/SCOOP powder Take 1 Container by mouth once.     zinc gluconate 50 MG tablet Take 50 mg by  mouth daily.     No current facility-administered medications for this visit.    Medication Side Effects: None  Allergies:  Allergies  Allergen Reactions   [place] Grass Extract Itching   Grass Pollen(K-O-R-T-Swt Vern)    Mold Extract  [Trichophyton] Itching   Molds & Smuts    Other Other (See Comments)    Dust mite/ unknown   Short Ragweed Pollen Ext     Past Medical History:  Diagnosis Date   Allergy    rag weed, pollen,dust   Ankle fracture 12/2017   Left    Anxiety    mild; infrequent per patient   Arthritis    Asthma    Bursitis of hip    Diverticulosis    External hemorrhoids    Fibroid    Fibromyalgia    Migraine    Ocular   Obesity    Peripheral edema    infrequent per patient   Pre-diabetes    Vitamin D deficiency     Family History  Problem Relation Age of Onset   Hypertension Father    Heart disease Father    Diabetes Father    Heart failure Father        31 death   Uterine cancer Sister    Diabetes Sister    Stroke Mother        26 death- hemorrhagic   Healthy Brother        other than smoker   Hypertension Sister    Colon cancer Neg Hx    Esophageal cancer Neg Hx    Stomach cancer Neg Hx    Rectal cancer Neg Hx     Social History   Socioeconomic History   Marital status: Single    Spouse name: Not on file   Number of children: Not on file   Years of education: Not on file   Highest education level: Not on file  Occupational History   Occupation: regulatory affairs    Employer: [employer]  Tobacco Use   Smoking status: Never   Smokeless tobacco: Never  Vaping Use   Vaping Use: Never used  Substance and Sexual Activity   Alcohol use: No   Drug use: No   Sexual activity: Not Currently    Birth control/protection: Abstinence, Surgical  Other Topics Concern   Not on file  Social History Narrative   Lives alone with 2 cats. Never Married. No kids.       Works for [employer] for 29 years in 2022.       Hobbies: time with  friends, enjoy [employer] (has been there since she was a child- perhaps 6 months), time with family- 2 sisters and a brother      Advanced directives: full code, has Living will, HCPOA- older sister Alwyn Pea (on file after hip replacement)   Social Determinants of Health   Financial Resource Strain: Not on file  Food Insecurity: Not on file  Transportation Needs: Not on file  Physical Activity: Not on file  Stress: Not on file  Social Connections: Not on file  Intimate Partner Violence: Not on file    Past Medical History, Surgical history, Social history, and Family history were reviewed and updated as appropriate.   Please see review of systems for further details on the patient's review from today.   Objective:   Physical Exam:  LMP 10/14/2000   Physical Exam Constitutional:      General: She is not in acute distress.    Appearance: She is well-developed. She is obese.  Musculoskeletal:        General: No deformity.  Neurological:     Mental Status: She is alert and oriented to person, place, and time.     Motor: No tremor.     Coordination: Coordination normal.     Gait: Gait normal.  Psychiatric:        Attention and Perception: Attention and perception normal.        Mood and Affect: Mood is anxious. Mood is not depressed. Affect is not labile, blunt or angry.        Speech: Speech normal.        Behavior: Behavior normal.        Thought Content: Thought content normal. Thought content is not delusional. Thought content does not include homicidal or suicidal ideation. Thought content does not include suicidal plan.        Cognition and Memory: Cognition normal.        Judgment: Judgment normal.     Comments: Insight intact. No auditory or visual hallucinations. No delusions.       Lab Review:     Component Value Date/Time   NA 144 12/25/2021 1430   K 4.6 12/25/2021 1430   CL 106 12/25/2021 1430   CO2 27 12/25/2021 1430   GLUCOSE 82 12/25/2021 1430    BUN 11 12/25/2021 1430   CREATININE 0.81 12/25/2021 1430   CREATININE 0.97 03/07/2016 1651   CALCIUM 9.5 12/25/2021 1430   PROT 7.2 12/25/2021 1430   ALBUMIN 4.3 12/25/2021 1430   AST 24 12/25/2021 1430   ALT 24 12/25/2021 1430   ALKPHOS 97 12/25/2021 1430   BILITOT 0.5 12/25/2021 1430   GFRNONAA >60 04/13/2020 0302   GFRNONAA 64 03/07/2016 1651   GFRAA 74 03/07/2016 1651       Component Value Date/Time   WBC 7.0 12/25/2021 1430   RBC 4.07 12/25/2021 1430   HGB 12.7 12/25/2021 1430   HGB 13.1 02/26/2020 1325   HCT 38.2 12/25/2021 1430   HCT 40.1 02/26/2020 1325   PLT 290.0 12/25/2021 1430   PLT 310 02/26/2020 1325   MCV 94.0 12/25/2021 1430   MCV 96 02/26/2020 1325   MCH 31.2 04/13/2020 0302   MCHC 33.3 12/25/2021 1430  RDW 13.0 12/25/2021 1430   RDW 12.7 02/26/2020 1325   LYMPHSABS 2.1 12/25/2021 1430   LYMPHSABS 2.1 02/26/2020 1325   MONOABS 0.4 12/25/2021 1430   EOSABS 0.2 12/25/2021 1430   EOSABS 0.2 02/26/2020 1325   BASOSABS 0.1 12/25/2021 1430   BASOSABS 0.0 02/26/2020 1325    No results found for: "POCLITH", "LITHIUM"   No results found for: "PHENYTOIN", "PHENOBARB", "VALPROATE", "CBMZ"   .res Assessment: Plan:    Generalized anxiety disorder - Plan: DULoxetine (CYMBALTA) 20 MG capsule  Dysthymia - Plan: DULoxetine (CYMBALTA) 20 MG capsule  Insomnia due to mental condition  History of major depression  Chronic fatigue syndrome with fibromyalgia - Plan: DULoxetine (CYMBALTA) 20 MG capsule    Greater than 50% of 30 min face to face time with patient was spent on counseling and coordination of care and grief counseling.   Disc stress at work with less help and loss of help and feeling disrespected.  Triggered thoughts about retirement next year.  Disc in detail retirement and mental health and being healthy in retirement. Plans it by November 2024.     Disc mechanism of action of SSRI vs buspirone.   She didn't lose weight off Lexapro but does  feel some worse.  Disc option retry fluoxetine, Viibryd, duloxetine bc history FM .   Disc pros and cons. D  isc SE in detail cp to other SSRIs  she wants to try it for anxiety and some dysthymia She wants to retry duloxetine.  20 mg daily then 40 mg daily.  Some more anxiety with stress causing chest tightness with normal workup.  Consider Genesight   She will plan to continue the buspirone to 15 mg nightly because she gets dizzy if she splits the dose. Options to tweak the dose.  As can been seen before above she has tried multiple other psych meds.  Disc SE.    Rare alprazolam. We discussed the short-term risks associated with benzodiazepines including sedation and increased fall risk among others.  Discussed long-term side effect risk including dependence, potential withdrawal symptoms, and the potential eventual dose-related risk of dementia.  But recent studies from 2020 dispute this association between benzodiazepines and dementia risk. Newer studies in 2020 do not support an association with dementia.  Importance of activity in retirement.    FU2  mos  Lynder Parents, MD, DFAPA   Please see After Visit Summary for patient specific instructions.  Future Appointments  Date Time Provider Lane  05/24/2022  2:00 PM Gatha Mayer, MD [facility]  12/31/2022  1:00 PM Marin Olp, MD [facility]    No orders of the defined types were placed in this encounter.     -------------------------------

## 2022-04-27 ENCOUNTER — Other Ambulatory Visit (HOSPITAL_COMMUNITY): Payer: Self-pay

## 2022-05-04 ENCOUNTER — Other Ambulatory Visit: Payer: Self-pay | Admitting: Psychiatry

## 2022-05-04 DIAGNOSIS — G9332 Myalgic encephalomyelitis/chronic fatigue syndrome: Secondary | ICD-10-CM

## 2022-05-04 DIAGNOSIS — F411 Generalized anxiety disorder: Secondary | ICD-10-CM

## 2022-05-04 DIAGNOSIS — F341 Dysthymic disorder: Secondary | ICD-10-CM

## 2022-05-20 ENCOUNTER — Encounter: Payer: Self-pay | Admitting: Certified Registered Nurse Anesthetist

## 2022-05-24 ENCOUNTER — Encounter: Payer: 59 | Admitting: Internal Medicine

## 2022-05-29 ENCOUNTER — Ambulatory Visit (AMBULATORY_SURGERY_CENTER): Payer: 59

## 2022-05-29 VITALS — Ht 65.0 in | Wt 218.0 lb

## 2022-05-29 DIAGNOSIS — Z1211 Encounter for screening for malignant neoplasm of colon: Secondary | ICD-10-CM

## 2022-05-29 NOTE — Progress Notes (Signed)

## 2022-06-07 DIAGNOSIS — H903 Sensorineural hearing loss, bilateral: Secondary | ICD-10-CM | POA: Insufficient documentation

## 2022-06-07 DIAGNOSIS — H6992 Unspecified Eustachian tube disorder, left ear: Secondary | ICD-10-CM | POA: Insufficient documentation

## 2022-06-13 ENCOUNTER — Encounter: Payer: Self-pay | Admitting: Internal Medicine

## 2022-06-21 ENCOUNTER — Ambulatory Visit (INDEPENDENT_AMBULATORY_CARE_PROVIDER_SITE_OTHER): Payer: 59 | Admitting: Psychiatry

## 2022-06-21 ENCOUNTER — Encounter: Payer: Self-pay | Admitting: Psychiatry

## 2022-06-21 DIAGNOSIS — F5105 Insomnia due to other mental disorder: Secondary | ICD-10-CM

## 2022-06-21 DIAGNOSIS — G9332 Myalgic encephalomyelitis/chronic fatigue syndrome: Secondary | ICD-10-CM

## 2022-06-21 DIAGNOSIS — Z8659 Personal history of other mental and behavioral disorders: Secondary | ICD-10-CM

## 2022-06-21 DIAGNOSIS — F341 Dysthymic disorder: Secondary | ICD-10-CM

## 2022-06-21 DIAGNOSIS — M797 Fibromyalgia: Secondary | ICD-10-CM

## 2022-06-21 DIAGNOSIS — F411 Generalized anxiety disorder: Secondary | ICD-10-CM

## 2022-06-21 MED ORDER — ALPRAZOLAM 0.5 MG PO TABS: 0.5000 mg | ORAL_TABLET | Freq: Two times a day (BID) | ORAL | 1 refills | Status: DC | PRN

## 2022-06-21 MED ORDER — BUSPIRONE HCL 15 MG PO TABS: 15.0000 mg | ORAL_TABLET | Freq: Two times a day (BID) | ORAL | 2 refills | Status: DC

## 2022-06-21 MED ORDER — CYCLOBENZAPRINE HCL 10 MG PO TABS: 10.0000 mg | ORAL_TABLET | Freq: Three times a day (TID) | ORAL | 1 refills | Status: DC | PRN

## 2022-06-21 MED ORDER — DULOXETINE HCL 20 MG PO CPEP: 40.0000 mg | ORAL_CAPSULE | Freq: Every day | ORAL | 1 refills | Status: DC

## 2022-06-21 NOTE — Progress Notes (Signed)
[name] [birth year] 67 y.o.     Subjective:   Patient ID:  Bridget Joyce is a 67 y.o. (DOB [birth year]) female.  Chief Complaint:  Chief Complaint  Patient presents with   Follow-up   Depression    HPI [name] presents  today for follow-up of anxiety and mood.  visit December 2020.  She wanted to wean off the citalopram to see if she can lose weight.  08/11/19 appt with the following noted: She called August 03, 2019 wanting to restart an antidepressant.  Her favorite cat had died.  She stated she felt the worst she ever had in her life.  She was having crying spells, depression, and insomnia along with a lot of anxiety with somatic symptoms and prominent guilt feelings.  As we had discussed before an alternative to citalopram was Lexapro she wanted to start the medication. Prescription sent for Lexapro 10 mg every evening. She started Lexapro at 5 mg daily.  Tolerated.  No effect so far.  A lot of guilt feelings over the loss of the cat. Functioning OK at work.  Prefers not to be alone initially but better some. Has supportive friends and sister. Plan: Start Lexapro 10 mg daily.  12/29/19 appt with the following noted: Increased to 10 mg Lexapro and felt better.  Trying to stop it now.  I felt better now re: cat.  Afraid that it can increase her appetite and wants to wean. Back down to 5 mg Lexapro for 2-3 weeks or so and now down to 1/4 tablet for a month..   Patient reports stable mood and denies depressed or irritable moods.  Patient denies any recent difficulty with more than mild anxiety.  Patient denies difficulty with sleep initiation or maintenance. Denies appetite disturbance.  Patient reports that energy and motivation have been good.  Patient denies any difficulty with concentration.  Patient denies any suicidal ideation.  Plan: She wants to wean Lexapro in hopes of losing weight.  Disc risk of relapse. Disc dosing and effect.  She will stop  it.  09/29/2020 appointment with the following noted: Did not lose weight off Lexapro. Overall doing well since here.  Feels about same off Lexapro.  Problems with [employer] today was frustrating. Poor customer service.   Anxiety level is manageable generally.  Not markedly depressed. Taking only buspirone 15 mg HS bc dizzy if takes in the am. Wants prn alprazolam for sleep. Patient reports stable mood and denies depressed or irritable moods.  Patient denies any recent difficulty with anxiety.  Patient denies difficulty with sleep initiation or maintenance. Denies appetite disturbance.  Patient reports that energy and motivation have been good.  Patient denies any difficulty with concentration.  Patient denies any suicidal ideation. Plan: She didn't lose weight off Lexapro but does not feel any worse either. She will plan to increase the buspirone to 30 mg nightly because she gets dizzy if she splits the dose.  09/28/21 appt noted: Buspirone makes her a little dizzy in the morning so usually takes it only at night. Some problems with work load bc not keeping the help needed.  It was a shock.  Lost an employee that wasn't replaced.  Made her want to quit.  Doesn't feel she was respected enough.  Disc poss of retirement and what she would do.  Worked [employer] her whole life.  Full retirement age next year at 76 and 64 mos. Worked there 30 years. Not markedly depressed.  But  has been more stressed. Sleep good. Plan: She didn't lose weight off Lexapro but does not feel any worse either. She will plan to continue the buspirone to 30 mg nightly because she gets dizzy if she splits the dose. Options to tweak the dose.  10/15/2021 phone call wanting to restart an SSRI. The response:Please talk to her and ask what symptoms she is having that make her want to go back on an SSRI.  I assume it is anxiety but it just needs to be documented.  She told me at the last recent appointment that she had stopped Lexapro and did  not really feel any different off of it.  Therefore my suggestion is that she retry sertraline.  She had some tremors when she took it before but that is probably because we push the dose a little too high.  We will keep the dose low and it actually has a better antianxiety effect and does Lexapro.  She did not have any other side effects with sertraline when she took it in the past. If she agrees send in prescription for sertraline 50 mg tablets 1/2 tablet daily for 10 days then 1 tablet daily #30 and 1 refill  If she agrees to the plan she needs to move an appointment up with me and schedule in about 2 months.  The improvement will be gradual but clearly noticeable by 1 month but improved further in the second month 10/18/2021 follow-up phone call:If she would prefer to resume citalopram instead of retrying sertraline, that is fine.  She can start one half citalopram daily for 1 week and then resume 1 tablet daily. She has a prescription for Xanax to use in the meantime until she starts getting benefit from citalopram in 3 to 4 weeks.  11/30/2021 appointment with with the following noted: Current psych meds she is taking buspirone 15 mg only at bedtime, alprazolam 0.5 mg twice daily as needed anxiety Took citalopram 3 night and then stopped. Asks about Prozac.   Always got to be doing something.  Don't know how to stop unless night time.  SSRIs have helped with this some in the past. Sometimes a bit more depression than normal like when first awakens AM. No kids or spouse. Asks about different types SSRIS Plan: Start fluoxetine 20 mg daily  04/12/22 appt noted: Took Prozac a few days and her legs would hurt at night.  Then stopped it. Aches happened with citalopram too.   Chronically stressed and eats junk food at night.  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  If pushes on leg muscles now then pain.  Chronic low exercise tolerance and muscular pain.   Plan: She wants to retry duloxetine.  20 mg daily then 40 mg  daily.  06/21/22 appt noted: On duloxetine 40 mg just for a few weeks. Mood good and no sig SE Hasn't tried exercise this winter.   Mood is good.  Still pretty stressed at work bc seasonal.    Big reports to do.  Full retirment age in Nov and plan to work until next April.  Nice new building.   Still on buspirone 15 HS and may try daytime again.  Past Psychiatric Medication Trials:  Wellbutrin anxiety,  sertraline tremor, Paxil sweats, citalopram leg aches, Trintellix ? took, fluoxetine legs hurt,  Effexor 150, duloxetine mirtazapine,  buspirone, Xanax, propranolol, Lyrica, gabapentin,  Adderall,  Took phentermine 1 month and helped  Review of Systems:  Review of Systems  Respiratory:  Negative for  shortness of breath.   Cardiovascular:  Negative for palpitations.  Musculoskeletal:  Positive for arthralgias and myalgias.  Neurological:  Positive for headaches. Negative for dizziness and tremors.  Psychiatric/Behavioral:  Negative for agitation, behavioral problems, confusion, decreased concentration, dysphoric mood, hallucinations, self-injury, sleep disturbance and suicidal ideas. The patient is nervous/anxious. The patient is not hyperactive.     Medications: I have reviewed the patient's current medications.  Current Outpatient Medications  Medication Sig Dispense Refill   albuterol (VENTOLIN HFA) 108 (90 Base) MCG/ACT inhaler Inhale 2 puffs into the lungs every 6 (six) hours as needed for wheezing or shortness of breath. 3 each 3   Ascorbic Acid (VITAMIN C PO) Take 1,000 mg by mouth daily.      CALCIUM PO Take 600 mg by mouth daily. Citracal     Cholecalciferol (VITAMIN D-1000 MAX ST) 25 MCG (1000 UT) tablet Take by mouth.     cyclobenzaprine (FLEXERIL) 10 MG tablet Take 1 tablet (10 mg total) by mouth 3 (three) times daily as needed for muscle spasms. 60 tablet 1   fluticasone (FLONASE) 50 MCG/ACT nasal spray Place 2 sprays into both nostrils daily. 16 g 6   furosemide (LASIX)  20 MG tablet TAKE 1 TO 2 TABLETS EVERY DAY AS NEEDED     Magnesium 400 MG CAPS Take 400 mg by mouth daily.      Multiple Vitamin (MULTIVITAMIN) tablet Take 1 tablet by mouth daily.     polyethylene glycol powder (GLYCOLAX/MIRALAX) 17 GM/SCOOP powder Take 1 Container by mouth once.     zinc gluconate 50 MG tablet Take 50 mg by mouth daily.     ALPRAZolam (XANAX) 0.5 MG tablet Take 1 tablet (0.5 mg total) by mouth 2 (two) times daily as needed for anxiety. 60 tablet 1   busPIRone (BUSPAR) 15 MG tablet Take 1 tablet (15 mg total) by mouth 2 (two) times daily. 180 tablet 2   DULoxetine (CYMBALTA) 20 MG capsule Take 2 capsules (40 mg total) by mouth daily. 180 capsule 1   No current facility-administered medications for this visit.    Medication Side Effects: None  Allergies:  Allergies  Allergen Reactions   [place] Grass Extract Itching   Dust Mite Extract Other (See Comments)   Grass Pollen(K-O-R-T-Swt Vern)    Mold Extract  [Trichophyton] Itching   Molds & Smuts    Other Other (See Comments)    Dust mite/ unknown   Short Ragweed Pollen Ext     Past Medical History:  Diagnosis Date   Allergy    rag weed, pollen,dust   Ankle fracture 12/2017   Left    Anxiety    mild; infrequent per patient   Arthritis    Asthma    Bursitis of hip    Diverticulosis    External hemorrhoids    Fibroid    Migraine    Ocular   Obesity    Peripheral edema    infrequent per patient   Pre-diabetes    Vitamin D deficiency     Family History  Problem Relation Age of Onset   Stroke Mother        40 death- hemorrhagic   Hypertension Father    Heart disease Father    Diabetes Father    Heart failure Father        44 death   Uterine cancer Sister    Diabetes Sister    Hypertension Sister    Healthy Brother  other than smoker   Colon cancer Neg Hx    Esophageal cancer Neg Hx    Stomach cancer Neg Hx    Rectal cancer Neg Hx    Colon polyps Neg Hx     Social History    Socioeconomic History   Marital status: Single    Spouse name: Not on file   Number of children: Not on file   Years of education: Not on file   Highest education level: Not on file  Occupational History   Occupation: regulatory affairs    Employer: [employer]  Tobacco Use   Smoking status: Never   Smokeless tobacco: Never  Vaping Use   Vaping Use: Never used  Substance and Sexual Activity   Alcohol use: No   Drug use: No   Sexual activity: Not Currently    Birth control/protection: Abstinence, Surgical  Other Topics Concern   Not on file  Social History Narrative   Lives alone with 2 cats. Never Married. No kids.       Works for [employer] for 29 years in 2022.       Hobbies: time with friends, enjoy [employer] (has been there since she was a child- perhaps 6 months), time with family- 2 sisters and a brother      Advanced directives: full code, has Living will, HCPOA- older sister Alwyn Pea (on file after hip replacement)   Social Determinants of Health   Financial Resource Strain: Not on file  Food Insecurity: Not on file  Transportation Needs: Not on file  Physical Activity: Not on file  Stress: Not on file  Social Connections: Not on file  Intimate Partner Violence: Not on file    Past Medical History, Surgical history, Social history, and Family history were reviewed and updated as appropriate.   Please see review of systems for further details on the patient's review from today.   Objective:   Physical Exam:  LMP 10/14/2000   Physical Exam Constitutional:      General: She is not in acute distress.    Appearance: She is well-developed. She is obese.  Musculoskeletal:        General: No deformity.  Neurological:     Mental Status: She is alert and oriented to person, place, and time.     Motor: No tremor.     Coordination: Coordination normal.     Gait: Gait normal.  Psychiatric:        Attention and Perception: Attention and perception  normal.        Mood and Affect: Mood is anxious. Mood is not depressed. Affect is not labile, blunt or angry.        Speech: Speech normal.        Behavior: Behavior normal.        Thought Content: Thought content normal. Thought content is not delusional. Thought content does not include homicidal or suicidal ideation. Thought content does not include suicidal plan.        Cognition and Memory: Cognition normal.        Judgment: Judgment normal.     Comments: Insight intact. No auditory or visual hallucinations. No delusions.       Lab Review:     Component Value Date/Time   NA 144 12/25/2021 1430   K 4.6 12/25/2021 1430   CL 106 12/25/2021 1430   CO2 27 12/25/2021 1430   GLUCOSE 82 12/25/2021 1430   BUN 11 12/25/2021 1430   CREATININE 0.81 12/25/2021 1430  CREATININE 0.97 03/07/2016 1651   CALCIUM 9.5 12/25/2021 1430   PROT 7.2 12/25/2021 1430   ALBUMIN 4.3 12/25/2021 1430   AST 24 12/25/2021 1430   ALT 24 12/25/2021 1430   ALKPHOS 97 12/25/2021 1430   BILITOT 0.5 12/25/2021 1430   GFRNONAA >60 04/13/2020 0302   GFRNONAA 64 03/07/2016 1651   GFRAA 74 03/07/2016 1651       Component Value Date/Time   WBC 7.0 12/25/2021 1430   RBC 4.07 12/25/2021 1430   HGB 12.7 12/25/2021 1430   HGB 13.1 02/26/2020 1325   HCT 38.2 12/25/2021 1430   HCT 40.1 02/26/2020 1325   PLT 290.0 12/25/2021 1430   PLT 310 02/26/2020 1325   MCV 94.0 12/25/2021 1430   MCV 96 02/26/2020 1325   MCH 31.2 04/13/2020 0302   MCHC 33.3 12/25/2021 1430   RDW 13.0 12/25/2021 1430   RDW 12.7 02/26/2020 1325   LYMPHSABS 2.1 12/25/2021 1430   LYMPHSABS 2.1 02/26/2020 1325   MONOABS 0.4 12/25/2021 1430   EOSABS 0.2 12/25/2021 1430   EOSABS 0.2 02/26/2020 1325   BASOSABS 0.1 12/25/2021 1430   BASOSABS 0.0 02/26/2020 1325    No results found for: "POCLITH", "LITHIUM"   No results found for: "PHENYTOIN", "PHENOBARB", "VALPROATE", "CBMZ"   .res Assessment: Plan:    Generalized anxiety disorder  - Plan: busPIRone (BUSPAR) 15 MG tablet, DULoxetine (CYMBALTA) 20 MG capsule  Dysthymia - Plan: busPIRone (BUSPAR) 15 MG tablet, DULoxetine (CYMBALTA) 20 MG capsule  Insomnia due to mental condition - Plan: ALPRAZolam (XANAX) 0.5 MG tablet  History of major depression  Chronic fatigue syndrome with fibromyalgia - Plan: DULoxetine (CYMBALTA) 20 MG capsule    Greater than 50% of 30 min face to face time with patient was spent on counseling and coordination of care and grief counseling.   Disc stress at work with less help and loss of help and feeling disrespected.  Triggered thoughts about retirement next year.  Disc in detail retirement and mental health and being healthy in retirement. Plans it by November 2024.    continue duloxetine 40 mg daily.  Good mood and anxiety benefit.  Less dysthymic.  Some more anxiety with stress causing chest tightness with normal workup.  Consider Genesight   She will plan to continue the buspirone to 15 mg nightly because she gets dizzy if she splits the dose. Options to tweak the dose.  As can been seen before above she has tried multiple other psych meds.  Disc SE.    Rare alprazolam. We discussed the short-term risks associated with benzodiazepines including sedation and increased fall risk among others.  Discussed long-term side effect risk including dependence, potential withdrawal symptoms, and the potential eventual dose-related risk of dementia.  But recent studies from 2020 dispute this association between benzodiazepines and dementia risk. Newer studies in 2020 do not support an association with dementia.  Importance of activity in retirement.  Disc goals.  Work can be hlepful for mood reasons.  Might retire in a year and then do consultation.    FU 4-6 mos  Lynder Parents, MD, DFAPA   Please see After Visit Summary for patient specific instructions.  Future Appointments  Date Time Provider [place]  06/26/2022  2:00 PM Gatha Mayer, MD [facility]  12/31/2022  1:00 PM Marin Olp, MD [facility]    No orders of the defined types were placed in this encounter.     -------------------------------

## 2022-06-24 ENCOUNTER — Encounter: Payer: Self-pay | Admitting: Certified Registered Nurse Anesthetist

## 2022-06-26 ENCOUNTER — Ambulatory Visit (AMBULATORY_SURGERY_CENTER): Payer: 59 | Admitting: Internal Medicine

## 2022-06-26 ENCOUNTER — Encounter: Payer: Self-pay | Admitting: Internal Medicine

## 2022-06-26 VITALS — BP 116/71 | HR 77 | Temp 98.7°F | Resp 16 | Ht 65.0 in | Wt 218.0 lb

## 2022-06-26 DIAGNOSIS — Z1211 Encounter for screening for malignant neoplasm of colon: Secondary | ICD-10-CM | POA: Diagnosis present

## 2022-06-26 DIAGNOSIS — D124 Benign neoplasm of descending colon: Secondary | ICD-10-CM

## 2022-06-26 MED ORDER — SODIUM CHLORIDE 0.9 % IV SOLN: 500.0000 mL | Freq: Once | INTRAVENOUS | Status: DC

## 2022-06-26 NOTE — Progress Notes (Signed)
Called to room to assist during endoscopic procedure.  Patient ID and intended procedure confirmed with present staff. Received instructions for my participation in the procedure from the performing physician.  

## 2022-06-26 NOTE — Progress Notes (Signed)
Report given to PACU, vss 

## 2022-06-26 NOTE — Progress Notes (Signed)
Pt's states no medical or surgical changes since previsit or office visit. 

## 2022-06-26 NOTE — Patient Instructions (Addendum)
I found and removed one small polyp that looks benign.  I will let you know pathology results and when to have another routine colonoscopy by mail and/or My Chart.  You also have a condition called diverticulosis - common and not usually a problem. Please read the handout provided.  I appreciate the opportunity to care for you.  Gatha Mayer, MD, FACG  YOU HAD AN ENDOSCOPIC PROCEDURE TODAY AT [facility]:   Refer to the procedure report that was given to you for any specific questions about what was found during the examination.  If the procedure report does not answer your questions, please call your gastroenterologist to clarify.  If you requested that your care partner not be given the details of your procedure findings, then the procedure report has been included in a sealed envelope for you to review at your convenience later.  YOU SHOULD EXPECT: Some feelings of bloating in the abdomen. Passage of more gas than usual.  Walking can help get rid of the air that was put into your GI tract during the procedure and reduce the bloating. If you had a lower endoscopy (such as a colonoscopy or flexible sigmoidoscopy) you may notice spotting of blood in your stool or on the toilet paper. If you underwent a bowel prep for your procedure, you may not have a normal bowel movement for a few days.  Please Note:  You might notice some irritation and congestion in your nose or some drainage.  This is from the oxygen used during your procedure.  There is no need for concern and it should clear up in a day or so.  SYMPTOMS TO REPORT IMMEDIATELY:  Following lower endoscopy (colonoscopy or flexible sigmoidoscopy):  Excessive amounts of blood in the stool  Significant tenderness or worsening of abdominal pains  Swelling of the abdomen that is new, acute  Fever of 100F or higher  For urgent or emergent issues, a gastroenterologist can be reached at any hour by calling [phone]. Do  not use MyChart messaging for urgent concerns.    DIET:  We do recommend a small meal at first, but then you may proceed to your regular diet.  Drink plenty of fluids but you should avoid alcoholic beverages for 24 hours.  ACTIVITY:  You should plan to take it easy for the rest of today and you should NOT DRIVE or use heavy machinery until tomorrow (because of the sedation medicines used during the test).    FOLLOW UP: Our staff will call the number listed on your records the next business day following your procedure.  We will call around 7:15- 8:00 am to check on you and address any questions or concerns that you may have regarding the information given to you following your procedure. If we do not reach you, we will leave a message.     If any biopsies were taken you will be contacted by phone or by letter within the next 1-3 weeks.  Please call us at [phone] if you have not heard about the biopsies in 3 weeks.    SIGNATURES/CONFIDENTIALITY: You and/or your care partner have signed paperwork which will be entered into your electronic medical record.  These signatures attest to the fact that that the information above on your After Visit Summary has been reviewed and is understood.  Full responsibility of the confidentiality of this discharge information lies with you and/or your care-partner.

## 2022-06-26 NOTE — Progress Notes (Signed)
[facility] History and Physical   Primary Care Physician:  Marin Olp, MD   Reason for Procedure:   CRCA screening  Plan:    colonoscopy     HPI: Bridget Joyce is a 67 y.o. female for repeat screening colonoscopy - no neoplasia on 2013 exam   Past Medical History:  Diagnosis Date   Allergy    rag weed, pollen,dust   Ankle fracture 12/2017   Left    Anxiety    mild; infrequent per patient   Arthritis    Asthma    Bursitis of hip    Diverticulosis    External hemorrhoids    Fibroid    Migraine    Ocular   Obesity    Peripheral edema    infrequent per patient   Pre-diabetes    Vitamin D deficiency     Past Surgical History:  Procedure Laterality Date   ABDOMINAL HYSTERECTOMY  2002   ovaries remain   BLEPHAROPLASTY Bilateral    COLONOSCOPY  09/02/2006   external hemorrhoids,diverticulosis   EYE SURGERY Left    adult stabismus   KNEE SURGERY     Left arthroscopic   LIPOMA EXCISION     Labial   MANDIBLE SURGERY     REPAIR ANKLE LIGAMENT Right 04/05/2014   x2   TONSILLECTOMY     TOTAL HIP ARTHROPLASTY Left 04/12/2020   Procedure: LEFT TOTAL HIP ARTHROPLASTY ANTERIOR APPROACH;  Surgeon: Mcarthur Rossetti, MD;  Location: [facility];  Service: Orthopedics;  Laterality: Left;    Prior to Admission medications   Medication Sig Start Date End Date Taking? Authorizing Provider  albuterol (VENTOLIN HFA) 108 (90 Base) MCG/ACT inhaler Inhale 2 puffs into the lungs every 6 (six) hours as needed for wheezing or shortness of breath. 03/09/21  Yes Marin Olp, MD  Ascorbic Acid (VITAMIN C PO) Take 1,000 mg by mouth daily.    Yes [provider]  busPIRone (BUSPAR) 15 MG tablet Take 1 tablet (15 mg total) by mouth 2 (two) times daily. 06/21/22  Yes Cottle, Billey Co., MD  CALCIUM PO Take 600 mg by mouth daily. Citracal   Yes [provider]  Cholecalciferol (VITAMIN D-1000 MAX ST) 25 MCG (1000 UT) tablet Take by mouth.   Yes  [provider]  DULoxetine (CYMBALTA) 20 MG capsule Take 2 capsules (40 mg total) by mouth daily. 06/21/22  Yes Cottle, Billey Co., MD  Magnesium 400 MG CAPS Take 400 mg by mouth daily.    Yes [provider]  Multiple Vitamin (MULTIVITAMIN) tablet Take 1 tablet by mouth daily.   Yes [provider]  polyethylene glycol powder (GLYCOLAX/MIRALAX) 17 GM/SCOOP powder Take 1 Container by mouth once.   Yes [provider]  zinc gluconate 50 MG tablet Take 50 mg by mouth daily.   Yes [provider]  ALPRAZolam (XANAX) 0.5 MG tablet Take 1 tablet (0.5 mg total) by mouth 2 (two) times daily as needed for anxiety. 06/21/22   Cottle, Billey Co., MD  cyclobenzaprine (FLEXERIL) 10 MG tablet Take 1 tablet (10 mg total) by mouth 3 (three) times daily as needed for muscle spasms. 06/21/22   Cottle, Billey Co., MD  fluticasone (FLONASE) 50 MCG/ACT nasal spray Place 2 sprays into both nostrils daily. 03/09/22   Loralee Pacas, MD  furosemide (LASIX) 20 MG tablet TAKE 1 TO 2 TABLETS EVERY DAY AS NEEDED    [provider]  bumetanide (BUMEX) 1  MG tablet Take 1 tablet (1 mg total) by mouth daily as needed. For fluid. Patient not taking: Reported on 04/06/2020 07/26/16 04/12/20  Vladimir Crofts, PA-C    Current Outpatient Medications  Medication Sig Dispense Refill   albuterol (VENTOLIN HFA) 108 (90 Base) MCG/ACT inhaler Inhale 2 puffs into the lungs every 6 (six) hours as needed for wheezing or shortness of breath. 3 each 3   Ascorbic Acid (VITAMIN C PO) Take 1,000 mg by mouth daily.      busPIRone (BUSPAR) 15 MG tablet Take 1 tablet (15 mg total) by mouth 2 (two) times daily. 180 tablet 2   CALCIUM PO Take 600 mg by mouth daily. Citracal     Cholecalciferol (VITAMIN D-1000 MAX ST) 25 MCG (1000 UT) tablet Take by mouth.     DULoxetine (CYMBALTA) 20 MG capsule Take 2 capsules (40 mg total) by mouth daily. 180 capsule 1   Magnesium 400 MG CAPS Take 400 mg by  mouth daily.      Multiple Vitamin (MULTIVITAMIN) tablet Take 1 tablet by mouth daily.     polyethylene glycol powder (GLYCOLAX/MIRALAX) 17 GM/SCOOP powder Take 1 Container by mouth once.     zinc gluconate 50 MG tablet Take 50 mg by mouth daily.     ALPRAZolam (XANAX) 0.5 MG tablet Take 1 tablet (0.5 mg total) by mouth 2 (two) times daily as needed for anxiety. 60 tablet 1   cyclobenzaprine (FLEXERIL) 10 MG tablet Take 1 tablet (10 mg total) by mouth 3 (three) times daily as needed for muscle spasms. 60 tablet 1   fluticasone (FLONASE) 50 MCG/ACT nasal spray Place 2 sprays into both nostrils daily. 16 g 6   furosemide (LASIX) 20 MG tablet TAKE 1 TO 2 TABLETS EVERY DAY AS NEEDED     Current Facility-Administered Medications  Medication Dose Route Frequency Provider Last Rate Last Admin   0.9 %  sodium chloride infusion  500 mL Intravenous Once Gatha Mayer, MD        Allergies as of 06/26/2022 - Review Complete 06/26/2022  Allergen Reaction Noted   [place] grass extract Itching 07/21/2012   Dust mite extract Other (See Comments) 03/13/2022   Grass pollen(k-o-r-t-swt vern)  05/04/2019   Mold extract  [trichophyton] Itching 07/21/2012   Molds & smuts  05/04/2019   Other Other (See Comments) 05/04/2019   Short ragweed pollen ext  05/04/2019    Family History  Problem Relation Age of Onset   Stroke Mother        45 death- hemorrhagic   Hypertension Father    Heart disease Father    Diabetes Father    Heart failure Father        20 death   Uterine cancer Sister    Diabetes Sister    Hypertension Sister    Healthy Brother        other than smoker   Colon cancer Neg Hx    Esophageal cancer Neg Hx    Stomach cancer Neg Hx    Rectal cancer Neg Hx    Colon polyps Neg Hx     Social History   Socioeconomic History   Marital status: Single    Spouse name: Not on file   Number of children: Not on file   Years of education: Not on file   Highest education level: Not on file   Occupational History   Occupation: regulatory affairs    Employer: [employer]  Tobacco Use   Smoking status: Never  Smokeless tobacco: Never  Vaping Use   Vaping Use: Never used  Substance and Sexual Activity   Alcohol use: No   Drug use: No   Sexual activity: Not Currently    Birth control/protection: Abstinence, Surgical  Other Topics Concern   Not on file  Social History Narrative   Lives alone with 2 cats. Never Married. No kids.       Works for [employer] for 29 years in 2022.       Hobbies: time with friends, enjoy [employer] (has been there since she was a child- perhaps 6 months), time with family- 2 sisters and a brother      Advanced directives: full code, has Living will, HCPOA- older sister Alwyn Pea (on file after hip replacement)   Social Determinants of Health   Financial Resource Strain: Not on file  Food Insecurity: Not on file  Transportation Needs: Not on file  Physical Activity: Not on file  Stress: Not on file  Social Connections: Not on file  Intimate Partner Violence: Not on file    Review of Systems:  All other review of systems negative except as mentioned in the HPI.  Physical Exam: Vital signs BP 125/64   Pulse 84   Temp 98.7 F (37.1 C) (Temporal)   Ht '5\' 5"'$  (1.651 m)   Wt 218 lb (98.9 kg)   LMP 10/14/2000   SpO2 96%   BMI 36.28 kg/m   General:   Alert,  Well-developed, well-nourished, pleasant and cooperative in NAD Lungs:  Clear throughout to auscultation.   Heart:  Regular rate and rhythm; no murmurs, clicks, rubs,  or gallops. Abdomen:  Soft, nontender and nondistended. Normal bowel sounds.   Neuro/Psych:  Alert and cooperative. Normal mood and affect. A and O x 3   '@Josephine Wooldridge'$  Simonne Maffucci, MD, [facility] [phone] (pager) 06/26/2022 2:03 PM@

## 2022-06-26 NOTE — Op Note (Signed)
[place] Patient Name: Bridget Joyce Procedure Date: 06/26/2022 1:55 PM MRN: [id number] Endoscopist: Gatha Mayer , MD, [phone] Age: 67 Referring MD:  Date of Birth: [birth year] Gender: Female Account #: [id number] Procedure:                Colonoscopy Indications:              Screening for colorectal malignant neoplasm, Last                            colonoscopy: 2013 Medicines:                Monitored Anesthesia Care Procedure:                Pre-Anesthesia Assessment:                           - Prior to the procedure, a History and Physical                            was performed, and patient medications and                            allergies were reviewed. The patient's tolerance of                            previous anesthesia was also reviewed. The risks                            and benefits of the procedure and the sedation                            options and risks were discussed with the patient.                            All questions were answered, and informed consent                            was obtained. Prior Anticoagulants: The patient has                            taken no anticoagulant or antiplatelet agents. ASA                            Grade Assessment: II - A patient with mild systemic                            disease. After reviewing the risks and benefits,                            the patient was deemed in satisfactory condition to                            undergo the procedure.  After obtaining informed consent, the colonoscope                            was passed under direct vision. Throughout the                            procedure, the patient's blood pressure, pulse, and                            oxygen saturations were monitored continuously. The                            CF [id number] was introduced through the anus                            and advanced to the the cecum,  identified by                            appendiceal orifice and ileocecal valve. The                            colonoscopy was performed without difficulty. The                            patient tolerated the procedure well. The quality                            of the bowel preparation was excellent. The                            ileocecal valve, appendiceal orifice, and rectum                            were photographed. The bowel preparation used was                            Miralax via split dose instruction. Scope In: 2:15:05 PM Scope Out: 2:29:15 PM Scope Withdrawal Time: 0 hours 11 minutes 9 seconds  Total Procedure Duration: 0 hours 14 minutes 10 seconds  Findings:                 The perianal and digital rectal examinations were                            normal.                           A 6 mm polyp was found in the distal descending                            colon. The polyp was semi-pedunculated. The polyp                            was removed with a cold snare. Resection and  retrieval were complete. Verification of patient                            identification for the specimen was done. Estimated                            blood loss was minimal.                           Multiple diverticula were found in the sigmoid                            colon, descending colon and transverse colon.                           The exam was otherwise without abnormality on                            direct and retroflexion views. Complications:            No immediate complications. Estimated Blood Loss:     Estimated blood loss was minimal. Impression:               - One 6 mm polyp in the distal descending colon,                            removed with a cold snare. Resected and retrieved.                           - Diverticulosis in the sigmoid colon, in the                            descending colon and in the transverse colon.                            - The examination was otherwise normal on direct                            and retroflexion views. Recommendation:           - Patient has a contact number available for                            emergencies. The signs and symptoms of potential                            delayed complications were discussed with the                            patient. Return to normal activities tomorrow.                            Written discharge instructions were provided to the  patient.                           - Resume previous diet.                           - Continue present medications.                           - Repeat colonoscopy is recommended. The                            colonoscopy date will be determined after pathology                            results from today's exam become available for                            review. Gatha Mayer, MD 06/26/2022 2:35:27 PM This report has been signed electronically.

## 2022-06-27 ENCOUNTER — Telehealth: Payer: Self-pay

## 2022-06-27 NOTE — Telephone Encounter (Signed)
Attempted f/u call. No answer, left VM. 

## 2022-07-06 ENCOUNTER — Encounter: Payer: Self-pay | Admitting: Internal Medicine

## 2022-07-06 DIAGNOSIS — Z860101 Personal history of adenomatous and serrated colon polyps: Secondary | ICD-10-CM

## 2022-07-06 DIAGNOSIS — Z86010 Personal history of colonic polyps: Secondary | ICD-10-CM

## 2022-07-06 HISTORY — DX: Personal history of adenomatous and serrated colon polyps: Z86.0101

## 2022-09-08 ENCOUNTER — Other Ambulatory Visit: Payer: Self-pay | Admitting: Internal Medicine

## 2022-09-08 DIAGNOSIS — H6992 Unspecified Eustachian tube disorder, left ear: Secondary | ICD-10-CM

## 2022-09-27 ENCOUNTER — Encounter: Payer: Self-pay | Admitting: Psychiatry

## 2022-09-27 ENCOUNTER — Ambulatory Visit (INDEPENDENT_AMBULATORY_CARE_PROVIDER_SITE_OTHER): Payer: 59 | Admitting: Psychiatry

## 2022-09-27 ENCOUNTER — Ambulatory Visit: Payer: Self-pay | Admitting: Obstetrics and Gynecology

## 2022-09-27 DIAGNOSIS — F341 Dysthymic disorder: Secondary | ICD-10-CM

## 2022-09-27 DIAGNOSIS — F5105 Insomnia due to other mental disorder: Secondary | ICD-10-CM | POA: Diagnosis not present

## 2022-09-27 DIAGNOSIS — F411 Generalized anxiety disorder: Secondary | ICD-10-CM

## 2022-09-27 DIAGNOSIS — G9332 Myalgic encephalomyelitis/chronic fatigue syndrome: Secondary | ICD-10-CM

## 2022-09-27 DIAGNOSIS — Z8659 Personal history of other mental and behavioral disorders: Secondary | ICD-10-CM

## 2022-09-27 DIAGNOSIS — M797 Fibromyalgia: Secondary | ICD-10-CM

## 2022-09-27 NOTE — Patient Instructions (Signed)
[id number]

## 2022-09-27 NOTE — Progress Notes (Addendum)
Bridget Joyce [id number] Apr 13, 1956 67 y.o.     Subjective:   Patient ID:  Bridget Joyce is a 67 y.o. (DOB Nov 16, 1955) female.  Chief Complaint:  Chief Complaint  Patient presents with   Follow-up    Mood and anxiety    HPI Bridget Joyce presents  today for follow-up of anxiety and mood.  visit December 2020.  She wanted to wean off the citalopram to see if she can lose weight.  08/11/19 appt with the following noted: She called August 03, 2019 wanting to restart an antidepressant.  Her favorite cat had died.  She stated she felt the worst she ever had in her life.  She was having crying spells, depression, and insomnia along with a lot of anxiety with somatic symptoms and prominent guilt feelings.  As we had discussed before an alternative to citalopram was Lexapro she wanted to start the medication. Prescription sent for Lexapro 10 mg every evening. She started Lexapro at 5 mg daily.  Tolerated.  No effect so far.  A lot of guilt feelings over the loss of the cat. Functioning OK at work.  Prefers not to be alone initially but better some. Has supportive friends and sister. Plan: Start Lexapro 10 mg daily.  12/29/19 appt with the following noted: Increased to 10 mg Lexapro and felt better.  Trying to stop it now.  I felt better now re: cat.  Afraid that it can increase her appetite and wants to wean. Back down to 5 mg Lexapro for 2-3 weeks or so and now down to 1/4 tablet for a month..   Patient reports stable mood and denies depressed or irritable moods.  Patient denies any recent difficulty with more than mild anxiety.  Patient denies difficulty with sleep initiation or maintenance. Denies appetite disturbance.  Patient reports that energy and motivation have been good.  Patient denies any difficulty with concentration.  Patient denies any suicidal ideation.  Plan: She wants to wean Lexapro in hopes of losing weight.  Disc risk of relapse. Disc dosing and effect.  She will stop  it.  09/29/2020 appointment with the following noted: Did not lose weight off Lexapro. Overall doing well since here.  Feels about same off Lexapro.  Problems with [employer] today was frustrating. Poor customer service.   Anxiety level is manageable generally.  Not markedly depressed. Taking only buspirone 15 mg HS bc dizzy if takes in the am. Wants prn alprazolam for sleep. Patient reports stable mood and denies depressed or irritable moods.  Patient denies any recent difficulty with anxiety.  Patient denies difficulty with sleep initiation or maintenance. Denies appetite disturbance.  Patient reports that energy and motivation have been good.  Patient denies any difficulty with concentration.  Patient denies any suicidal ideation. Plan: She didn't lose weight off Lexapro but does not feel any worse either. She will plan to increase the buspirone to 30 mg nightly because she gets dizzy if she splits the dose.  09/28/21 appt noted: Buspirone makes her a little dizzy in the morning so usually takes it only at night. Some problems with work load bc not keeping the help needed.  It was a shock.  Lost an employee that wasn't replaced.  Made her want to quit.  Doesn't feel she was respected enough.  Disc poss of retirement and what she would do.  Worked FT her whole life.  Full retirement age next year at 31 and 8 mos. Worked there 30 years. Not markedly  depressed.  But has been more stressed. Sleep good. Plan: She didn't lose weight off Lexapro but does not feel any worse either. She will plan to continue the buspirone to 30 mg nightly because she gets dizzy if she splits the dose. Options to tweak the dose.  10/15/2021 phone call wanting to restart an SSRI. The response:Please talk to her and ask what symptoms she is having that make her want to go back on an SSRI.  I assume it is anxiety but it just needs to be documented.  She told me at the last recent appointment that she had stopped Lexapro and did  not really feel any different off of it.  Therefore my suggestion is that she retry sertraline.  She had some tremors when she took it before but that is probably because we push the dose a little too high.  We will keep the dose low and it actually has a better antianxiety effect and does Lexapro.  She did not have any other side effects with sertraline when she took it in the past. If she agrees send in prescription for sertraline 50 mg tablets 1/2 tablet daily for 10 days then 1 tablet daily #30 and 1 refill  If she agrees to the plan she needs to move an appointment up with me and schedule in about 2 months.  The improvement will be gradual but clearly noticeable by 1 month but improved further in the second month 10/18/2021 follow-up phone call:If she would prefer to resume citalopram instead of retrying sertraline, that is fine.  She can start one half citalopram daily for 1 week and then resume 1 tablet daily. She has a prescription for Xanax to use in the meantime until she starts getting benefit from citalopram in 3 to 4 weeks.  11/30/2021 appointment with with the following noted: Current psych meds she is taking buspirone 15 mg only at bedtime, alprazolam 0.5 mg twice daily as needed anxiety Took citalopram 3 night and then stopped. Asks about Prozac.   Always got to be doing something.  Don't know how to stop unless night time.  SSRIs have helped with this some in the past. Sometimes a bit more depression than normal like when first awakens AM. No kids or spouse. Asks about different types SSRIS Plan: Start fluoxetine 20 mg daily  04/12/22 appt noted: Took Prozac a few days and her legs would hurt at night.  Then stopped it. Aches happened with citalopram too.   Chronically stressed and eats junk food at night.  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  If pushes on leg muscles now then pain.  Chronic low exercise tolerance and muscular pain.   Plan: She wants to retry duloxetine.  20 mg daily then 40 mg  daily.  06/21/22 appt noted: On duloxetine 40 mg just for a few weeks. Mood good and no sig SE Hasn't tried exercise this winter.   Mood is good.  Still pretty stressed at work bc seasonal.    Big reports to do.  Full retirment age in Nov and plan to work until next April.  Nice new building.   Still on buspirone 15 HS and may try daytime again. Plan: Continue duloxetine 40 mg daily and buspirone 15 mg nightly, and alprazolam 0.5 mg prn  09/27/2022 appointment noted: Continues meds.  But only taking20 mg duloxetine Not sure if duloxetine helped myalgias. Mind is consumed with work and very vusy outside of work.  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  Takes her a long time to make decisions on colors etc.  Don't stop enough to get depressed. She is pleased with the work. Mood is ok generally.  No consistent dep but brief periods.   No change in wt off citalopram.   No SE Same job 20+ years.  Hopes to retire next year to more pleasant job.  Goood work International aid/development worker.   Past Psychiatric Medication Trials:  Wellbutrin anxiety,  sertraline tremor, Paxil sweats, citalopram leg aches, Trintellix ? took, fluoxetine legs hurt,  Effexor 150, duloxetine 40 mirtazapine,  buspirone, Xanax, propranolol, Lyrica, gabapentin,  Adderall,  Took phentermine 1 month and helped  Review of Systems:  Review of Systems  Respiratory:  Negative for shortness of breath.   Cardiovascular:  Negative for palpitations.  Musculoskeletal:  Positive for arthralgias and myalgias.  Neurological:  Positive for headaches. Negative for dizziness, tremors and weakness.  Psychiatric/Behavioral:  Negative for agitation, behavioral problems, confusion, decreased concentration, dysphoric mood, hallucinations, self-injury, sleep disturbance and suicidal ideas. The patient is nervous/anxious. The patient is not hyperactive.     Medications: I have reviewed the patient's current medications.  Current Outpatient  Medications  Medication Sig Dispense Refill   albuterol (VENTOLIN HFA) 108 (90 Base) MCG/ACT inhaler Inhale 2 puffs into the lungs every 6 (six) hours as needed for wheezing or shortness of breath. 3 each 3   ALPRAZolam (XANAX) 0.5 MG tablet Take 1 tablet (0.5 mg total) by mouth 2 (two) times daily as needed for anxiety. 60 tablet 1   Ascorbic Acid (VITAMIN C PO) Take 1,000 mg by mouth daily.      busPIRone (BUSPAR) 15 MG tablet Take 1 tablet (15 mg total) by mouth 2 (two) times daily. 180 tablet 2   CALCIUM PO Take 600 mg by mouth daily. Citracal     Cholecalciferol (VITAMIN D-1000 MAX ST) 25 MCG (1000 UT) tablet Take by mouth.     cyclobenzaprine (FLEXERIL) 10 MG tablet Take 1 tablet (10 mg total) by mouth 3 (three) times daily as needed for muscle spasms. 60 tablet 1   DULoxetine (CYMBALTA) 20 MG capsule Take 2 capsules (40 mg total) by mouth daily. 180 capsule 1   fluticasone (FLONASE) 50 MCG/ACT nasal spray SPRAY 2 SPRAYS INTO EACH NOSTRIL EVERY DAY 48 mL 2   furosemide (LASIX) 20 MG tablet TAKE 1 TO 2 TABLETS EVERY DAY AS NEEDED     Magnesium 400 MG CAPS Take 400 mg by mouth daily.      Multiple Vitamin (MULTIVITAMIN) tablet Take 1 tablet by mouth daily.     polyethylene glycol powder (GLYCOLAX/MIRALAX) 17 GM/SCOOP powder Take 1 Container by mouth once.     zinc gluconate 50 MG tablet Take 50 mg by mouth daily.     No current facility-administered medications for this visit.    Medication Side Effects: None  Allergies:  Allergies  Allergen Reactions   [place] Grass Extract Itching   Dust Mite Extract Other (See Comments)   Grass Pollen(K-O-R-T-Swt Vern)    Mold Extract  [Trichophyton] Itching   Molds & Smuts    Other Other (See Comments)    Dust mite/ unknown   Short Ragweed Pollen Ext     Past Medical History:  Diagnosis Date   Allergy    rag weed, pollen,dust   Ankle fracture 12/2017   Left    Anxiety    mild; infrequent per patient   Arthritis    Asthma     Bursitis of  hip    Diverticulosis    External hemorrhoids    Fibroid    Hx of adenomatous polyp of colon 07/06/2022   Migraine    Ocular   Obesity    Peripheral edema    infrequent per patient   Pre-diabetes    Vitamin D deficiency     Family History  Problem Relation Age of Onset   Stroke Mother        42 death- hemorrhagic   Hypertension Father    Heart disease Father    Diabetes Father    Heart failure Father        3 death   Uterine cancer Sister    Diabetes Sister    Hypertension Sister    Healthy Brother        other than smoker   Colon cancer Neg Hx    Esophageal cancer Neg Hx    Stomach cancer Neg Hx    Rectal cancer Neg Hx    Colon polyps Neg Hx     Social History   Socioeconomic History   Marital status: Single    Spouse name: Not on file   Number of children: Not on file   Years of education: Not on file   Highest education level: Not on file  Occupational History   Occupation: regulatory affairs    Employer: [employer]  Tobacco Use   Smoking status: Never   Smokeless tobacco: Never  Vaping Use   Vaping Use: Never used  Substance and Sexual Activity   Alcohol use: No   Drug use: No   Sexual activity: Not Currently    Birth control/protection: Abstinence, Surgical  Other Topics Concern   Not on file  Social History Narrative   Lives alone with 2 cats. Never Married. No kids.       Works for [employer] for 29 years in 2022.       Hobbies: time with friends, enjoy [employer] (has been there since she was a child- perhaps 6 months), time with family- 2 sisters and a brother      Advanced directives: full code, has Living will, HCPOA- older sister Elesa Massed (on file after hip replacement)   Social Determinants of Health   Financial Resource Strain: Not on file  Food Insecurity: Not on file  Transportation Needs: Not on file  Physical Activity: Not on file  Stress: Not on file  Social Connections: Not on file  Intimate Partner  Violence: Not on file    Past Medical History, Surgical history, Social history, and Family history were reviewed and updated as appropriate.   Please see review of systems for further details on the patient's review from today.   Objective:   Physical Exam:  LMP 10/14/2000   Physical Exam Constitutional:      General: She is not in acute distress.    Appearance: She is well-developed. She is obese.  Musculoskeletal:        General: No deformity.  Neurological:     Mental Status: She is alert and oriented to person, place, and time.     Motor: No tremor.     Coordination: Coordination normal.     Gait: Gait normal.  Psychiatric:        Attention and Perception: Attention and perception normal.        Mood and Affect: Mood is anxious. Mood is not depressed. Affect is not labile, blunt or angry.        Speech: Speech normal.  Behavior: Behavior normal.        Thought Content: Thought content normal. Thought content is not delusional. Thought content does not include homicidal or suicidal ideation. Thought content does not include suicidal plan.        Cognition and Memory: Cognition normal.        Judgment: Judgment normal.     Comments: Insight intact. No auditory or visual hallucinations. No delusions.  Dep managed.      Lab Review:     Component Value Date/Time   NA 144 12/25/2021 1430   K 4.6 12/25/2021 1430   CL 106 12/25/2021 1430   CO2 27 12/25/2021 1430   GLUCOSE 82 12/25/2021 1430   BUN 11 12/25/2021 1430   CREATININE 0.81 12/25/2021 1430   CREATININE 0.97 03/07/2016 1651   CALCIUM 9.5 12/25/2021 1430   PROT 7.2 12/25/2021 1430   ALBUMIN 4.3 12/25/2021 1430   AST 24 12/25/2021 1430   ALT 24 12/25/2021 1430   ALKPHOS 97 12/25/2021 1430   BILITOT 0.5 12/25/2021 1430   GFRNONAA >60 04/13/2020 0302   GFRNONAA 64 03/07/2016 1651   GFRAA 74 03/07/2016 1651       Component Value Date/Time   WBC 7.0 12/25/2021 1430   RBC 4.07 12/25/2021 1430   HGB  12.7 12/25/2021 1430   HGB 13.1 02/26/2020 1325   HCT 38.2 12/25/2021 1430   HCT 40.1 02/26/2020 1325   PLT 290.0 12/25/2021 1430   PLT 310 02/26/2020 1325   MCV 94.0 12/25/2021 1430   MCV 96 02/26/2020 1325   MCH 31.2 04/13/2020 0302   MCHC 33.3 12/25/2021 1430   RDW 13.0 12/25/2021 1430   RDW 12.7 02/26/2020 1325   LYMPHSABS 2.1 12/25/2021 1430   LYMPHSABS 2.1 02/26/2020 1325   MONOABS 0.4 12/25/2021 1430   EOSABS 0.2 12/25/2021 1430   EOSABS 0.2 02/26/2020 1325   BASOSABS 0.1 12/25/2021 1430   BASOSABS 0.0 02/26/2020 1325    No results found for: "POCLITH", "LITHIUM"   No results found for: "PHENYTOIN", "PHENOBARB", "VALPROATE", "CBMZ"   .res Assessment: Plan:    Generalized anxiety disorder  Dysthymia  Insomnia due to mental condition  History of major depression  Chronic fatigue syndrome with fibromyalgia    30 min face to face time with patient was spent on counseling and coordination of care and grief counseling.   Disc in detail retirement and mental health and being healthy in retirement. Plans it by November 2024.    Mood is ok.   Some more anxiety with stress causing chest tightness with normal workup.  Consider Genesight   She will plan to continue the buspirone to 15 mg nightly because she gets dizzy if she splits the dose. Options to tweak the dose.  As can been seen before above she has tried multiple other psych meds.  Disc SE.    Rare alprazolam. We discussed the short-term risks associated with benzodiazepines including sedation and increased fall risk among others.  Discussed long-term side effect risk including dependence, potential withdrawal symptoms, and the potential eventual dose-related risk of dementia.  But recent studies from 2020 dispute this association between benzodiazepines and dementia risk. Newer studies in 2020 do not support an association with dementia.  Importance of activity in retirement.  Disc goals.  Work can be  hlepful for mood reasons.  Might retire in a year and then do consultation.   Disc use of duloxetine for FM pain. [id number]  No med changes:  Continue  buspirone  15 mg nightly, and alprazolam 0.5 mg prn Increase duloxetine from 20 mg to 40 mg daily   FU 4-6 mos  Meredith Staggers, MD, DFAPA   Please see After Visit Summary for patient specific instructions.  Future Appointments  Date Time Provider Department Center  10/12/2022 12:00 PM Romualdo Bolk, MD [facility] None  01/01/2023  2:00 PM Shelva Majestic, MD [facility]    No orders of the defined types were placed in this encounter.     -------------------------------

## 2022-10-04 NOTE — Progress Notes (Signed)
67 y.o. G0P0 Single White or Caucasian Not Hispanic or Latino female here for annual exam.  H/O hysterectomy.  No c/o.     Patient's last menstrual period was 10/14/2000.          Sexually active: No.  The current method of family planning is status post hysterectomy.    Exercising: No.     Smoker:  no  Health Maintenance: Pap:  07/28/20 WNL Hr hpv Neg,  01/31/17 Neg  History of abnormal Pap:  no MMG:  08/25/21 Breast Density cat C, BI-RADS CAT 1 neg, scheduled 10/30/22 BMD:   03/15/17 normal Colonoscopy:   06/26/22 TDaP:  2014 Gardasil: n/a   reports that she has never smoked. She has never used smokeless tobacco. She reports that she does not drink alcohol and does not use drugs. 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 Plans to retire in the next year.   Past Medical History:  Diagnosis Date   Allergy    rag weed, pollen,dust   Ankle fracture 12/2017   Left    Anxiety    mild; infrequent per patient   Arthritis    Asthma    Bursitis of hip    Diverticulosis    External hemorrhoids    Fibroid    Hx of adenomatous polyp of colon 07/06/2022   Migraine    Ocular   Obesity    Peripheral edema    infrequent per patient   Pre-diabetes    Vitamin D deficiency     Past Surgical History:  Procedure Laterality Date   ABDOMINAL HYSTERECTOMY  2002   ovaries remain   BLEPHAROPLASTY Bilateral    COLONOSCOPY  09/02/2006   external hemorrhoids,diverticulosis   EYE SURGERY Left    adult stabismus   KNEE SURGERY     Left arthroscopic   LIPOMA EXCISION     Labial   MANDIBLE SURGERY     REPAIR ANKLE LIGAMENT Right 04/05/2014   x2   TONSILLECTOMY     TOTAL HIP ARTHROPLASTY Left 04/12/2020   Procedure: LEFT TOTAL HIP ARTHROPLASTY ANTERIOR APPROACH;  Surgeon: Kathryne Hitch, MD;  Location: [facility];  Service: Orthopedics;  Laterality: Left;    Current Outpatient Medications  Medication Sig Dispense Refill   albuterol (VENTOLIN HFA) 108 (90 Base) MCG/ACT inhaler  Inhale 2 puffs into the lungs every 6 (six) hours as needed for wheezing or shortness of breath. 3 each 3   ALPRAZolam (XANAX) 0.5 MG tablet Take 1 tablet (0.5 mg total) by mouth 2 (two) times daily as needed for anxiety. 60 tablet 1   Ascorbic Acid (VITAMIN C PO) Take 1,000 mg by mouth daily.      busPIRone (BUSPAR) 15 MG tablet Take 1 tablet (15 mg total) by mouth 2 (two) times daily. 180 tablet 2   CALCIUM PO Take 600 mg by mouth daily. Citracal     Cholecalciferol (VITAMIN D-1000 MAX ST) 25 MCG (1000 UT) tablet Take by mouth.     DULoxetine (CYMBALTA) 20 MG capsule Take 2 capsules (40 mg total) by mouth daily. 180 capsule 1   fluticasone (FLONASE) 50 MCG/ACT nasal spray SPRAY 2 SPRAYS INTO EACH NOSTRIL EVERY DAY 48 mL 2   furosemide (LASIX) 20 MG tablet TAKE 1 TO 2 TABLETS EVERY DAY AS NEEDED     Magnesium 400 MG CAPS Take 400 mg by mouth daily.      Multiple Vitamin (MULTIVITAMIN) tablet Take 1 tablet by mouth daily.     zinc gluconate  50 MG tablet Take 50 mg by mouth daily.     cyclobenzaprine (FLEXERIL) 10 MG tablet Take 1 tablet (10 mg total) by mouth 3 (three) times daily as needed for muscle spasms. (Patient not taking: Reported on 10/12/2022) 60 tablet 1   No current facility-administered medications for this visit.    Family History  Problem Relation Age of Onset   Stroke Mother        61 death- hemorrhagic   Hypertension Father    Heart disease Father    Diabetes Father    Heart failure Father        55 death   Uterine cancer Sister    Diabetes Sister    Hypertension Sister    Healthy Brother        other than smoker   Colon cancer Neg Hx    Esophageal cancer Neg Hx    Stomach cancer Neg Hx    Rectal cancer Neg Hx    Colon polyps Neg Hx     Review of Systems  All other systems reviewed and are negative.   Exam:   BP 126/72 (BP Location: Left Arm, Patient Position: Sitting, Cuff Size: Large)   Pulse 83   Ht 5' 5.5" (1.664 m)   Wt 231 lb (104.8 kg)   LMP  10/14/2000   SpO2 95%   BMI 37.86 kg/m   Weight change: @WEIGHTCHANGE @ Height:   Height: 5' 5.5" (166.4 cm)  Ht Readings from Last 3 Encounters:  10/12/22 5' 5.5" (1.664 m)  06/26/22 5\' 5"  (1.651 m)  05/29/22 5\' 5"  (1.651 m)    General appearance: alert, cooperative and appears stated age Head: Normocephalic, without obvious abnormality, atraumatic Neck: no adenopathy, supple, symmetrical, trachea midline and thyroid normal to inspection and palpation Breasts: normal appearance, no masses or tenderness Abdomen: soft, non-tender; non distended,  no masses,  no organomegaly Extremities: extremities normal, atraumatic, no cyanosis or edema Skin: Skin color, texture, turgor normal. No rashes or lesions Lymph nodes: Cervical, supraclavicular, and axillary nodes normal. No abnormal inguinal nodes palpated Neurologic: Grossly normal   Pelvic: External genitalia:  no lesions              Urethra:  normal appearing urethra with no masses, tenderness or lesions              Bartholins and Skenes: normal                 Vagina: atrophic appearing vagina               Cervix: absent               Bimanual Exam:  Uterus:  uterus absent              Adnexa: no mass, fullness, tenderness               Rectovaginal: Confirms               Anus:  normal sphincter tone, no lesions  Kristin Bruins, CMA chaperoned for the exam.  1. Encounter for breast and pelvic examination Discussed breast self exam Discussed calcium and vit D intake Mammogram scheduled Colonoscopy UTD

## 2022-10-08 ENCOUNTER — Other Ambulatory Visit: Payer: Self-pay | Admitting: Family Medicine

## 2022-10-08 DIAGNOSIS — H9312 Tinnitus, left ear: Secondary | ICD-10-CM | POA: Insufficient documentation

## 2022-10-08 DIAGNOSIS — Z9109 Other allergy status, other than to drugs and biological substances: Secondary | ICD-10-CM | POA: Insufficient documentation

## 2022-10-08 DIAGNOSIS — Z1231 Encounter for screening mammogram for malignant neoplasm of breast: Secondary | ICD-10-CM

## 2022-10-12 ENCOUNTER — Ambulatory Visit (INDEPENDENT_AMBULATORY_CARE_PROVIDER_SITE_OTHER): Payer: 59 | Admitting: Obstetrics and Gynecology

## 2022-10-12 ENCOUNTER — Encounter: Payer: Self-pay | Admitting: Obstetrics and Gynecology

## 2022-10-12 VITALS — BP 126/72 | HR 83 | Ht 65.5 in | Wt 231.0 lb

## 2022-10-12 DIAGNOSIS — Z01419 Encounter for gynecological examination (general) (routine) without abnormal findings: Secondary | ICD-10-CM | POA: Diagnosis not present

## 2022-10-30 ENCOUNTER — Ambulatory Visit
Admission: RE | Admit: 2022-10-30 | Discharge: 2022-10-30 | Disposition: A | Discharge status: Home / Self Care | Payer: 59 | Source: Ambulatory Visit | Attending: Family Medicine | Admitting: Family Medicine

## 2022-10-30 DIAGNOSIS — Z1231 Encounter for screening mammogram for malignant neoplasm of breast: Secondary | ICD-10-CM

## 2022-11-23 ENCOUNTER — Other Ambulatory Visit: Payer: Self-pay | Admitting: Psychiatry

## 2022-11-23 DIAGNOSIS — M797 Fibromyalgia: Secondary | ICD-10-CM

## 2022-11-23 DIAGNOSIS — F411 Generalized anxiety disorder: Secondary | ICD-10-CM

## 2022-11-23 DIAGNOSIS — F341 Dysthymic disorder: Secondary | ICD-10-CM

## 2022-11-23 DIAGNOSIS — G9332 Myalgic encephalomyelitis/chronic fatigue syndrome: Secondary | ICD-10-CM

## 2022-12-13 ENCOUNTER — Encounter (INDEPENDENT_AMBULATORY_CARE_PROVIDER_SITE_OTHER): Payer: Self-pay

## 2022-12-31 ENCOUNTER — Encounter: Payer: 59 | Admitting: Family Medicine

## 2023-01-01 ENCOUNTER — Encounter: Payer: 59 | Admitting: Family Medicine

## 2023-01-09 ENCOUNTER — Telehealth: Payer: Self-pay | Admitting: Orthopaedic Surgery

## 2023-01-09 NOTE — Telephone Encounter (Signed)
Attempted to call patient multiple times, unable to reach patient to advise CD is ready for pick up at front desk.

## 2023-01-09 NOTE — Telephone Encounter (Signed)
Pt is requesting xrays of left hip from 2023 with Magnus Ivan, patient needs them tomorrow morning

## 2023-01-09 NOTE — Telephone Encounter (Signed)
CD is at the front desk ready for pickup

## 2023-03-19 ENCOUNTER — Encounter: Payer: Self-pay | Admitting: Psychiatry

## 2023-03-19 ENCOUNTER — Ambulatory Visit (INDEPENDENT_AMBULATORY_CARE_PROVIDER_SITE_OTHER): Payer: 59 | Admitting: Psychiatry

## 2023-03-19 DIAGNOSIS — F5105 Insomnia due to other mental disorder: Secondary | ICD-10-CM | POA: Diagnosis not present

## 2023-03-19 DIAGNOSIS — F411 Generalized anxiety disorder: Secondary | ICD-10-CM | POA: Diagnosis not present

## 2023-03-19 DIAGNOSIS — F341 Dysthymic disorder: Secondary | ICD-10-CM | POA: Diagnosis not present

## 2023-03-19 MED ORDER — BUSPIRONE HCL 15 MG PO TABS: 15.0000 mg | ORAL_TABLET | Freq: Two times a day (BID) | ORAL | 2 refills | Status: DC

## 2023-03-19 MED ORDER — ALPRAZOLAM 0.5 MG PO TABS: 0.5000 mg | ORAL_TABLET | Freq: Two times a day (BID) | ORAL | 1 refills | Status: DC | PRN

## 2023-03-19 NOTE — Progress Notes (Signed)
Bridget Joyce [id number] 11-16-1955 67 y.o.     Subjective:   Patient ID:  Bridget Joyce is a 67 y.o. (DOB 10/04/1955) female.  Chief Complaint:  Chief Complaint  Patient presents with   Follow-up   Depression   Anxiety    HPI Bridget Joyce presents  today for follow-up of anxiety and mood.  visit December 2020.  She wanted to wean off the citalopram to see if she can lose weight.  08/11/19 appt with the following noted: She called August 03, 2019 wanting to restart an antidepressant.  Her favorite cat had died.  She stated she felt the worst she ever had in her life.  She was having crying spells, depression, and insomnia along with a lot of anxiety with somatic symptoms and prominent guilt feelings.  As we had discussed before an alternative to citalopram was Lexapro she wanted to start the medication. Prescription sent for Lexapro 10 mg every evening. She started Lexapro at 5 mg daily.  Tolerated.  No effect so far.  A lot of guilt feelings over the loss of the cat. Functioning OK at work.  Prefers not to be alone initially but better some. Has supportive friends and sister. Plan: Start Lexapro 10 mg daily.  12/29/19 appt with the following noted: Increased to 10 mg Lexapro and felt better.  Trying to stop it now.  I felt better now re: cat.  Afraid that it can increase her appetite and wants to wean. Back down to 5 mg Lexapro for 2-3 weeks or so and now down to 1/4 tablet for a month..   Patient reports stable mood and denies depressed or irritable moods.  Patient denies any recent difficulty with more than mild anxiety.  Patient denies difficulty with sleep initiation or maintenance. Denies appetite disturbance.  Patient reports that energy and motivation have been good.  Patient denies any difficulty with concentration.  Patient denies any suicidal ideation.  Plan: She wants to wean Lexapro in hopes of losing weight.  Disc risk of relapse. Disc dosing and effect.  She will  stop it.  09/29/2020 appointment with the following noted: Did not lose weight off Lexapro. Overall doing well since here.  Feels about same off Lexapro.  Problems with [employer] today was frustrating. Poor customer service.   Anxiety level is manageable generally.  Not markedly depressed. Taking only buspirone 15 mg HS bc dizzy if takes in the am. Wants prn alprazolam for sleep. Patient reports stable mood and denies depressed or irritable moods.  Patient denies any recent difficulty with anxiety.  Patient denies difficulty with sleep initiation or maintenance. Denies appetite disturbance.  Patient reports that energy and motivation have been good.  Patient denies any difficulty with concentration.  Patient denies any suicidal ideation. Plan: She didn't lose weight off Lexapro but does not feel any worse either. She will plan to increase the buspirone to 30 mg nightly because she gets dizzy if she splits the dose.  09/28/21 appt noted: Buspirone makes her a little dizzy in the morning so usually takes it only at night. Some problems with work load bc not keeping the help needed.  It was a shock.  Lost an employee that wasn't replaced.  Made her want to quit.  Doesn't feel she was respected enough.  Disc poss of retirement and what she would do.  Worked FT her whole life.  Full retirement age next year at 40 and 8 mos. Worked there 30 years. Not markedly  depressed.  But has been more stressed. Sleep good. Plan: She didn't lose weight off Lexapro but does not feel any worse either. She will plan to continue the buspirone to 30 mg nightly because she gets dizzy if she splits the dose. Options to tweak the dose.  10/15/2021 phone call wanting to restart an SSRI. The response:Please talk to her and ask what symptoms she is having that make her want to go back on an SSRI.  I assume it is anxiety but it just needs to be documented.  She told me at the last recent appointment that she had stopped Lexapro and  did not really feel any different off of it.  Therefore my suggestion is that she retry sertraline.  She had some tremors when she took it before but that is probably because we push the dose a little too high.  We will keep the dose low and it actually has a better antianxiety effect and does Lexapro.  She did not have any other side effects with sertraline when she took it in the past. If she agrees send in prescription for sertraline 50 mg tablets 1/2 tablet daily for 10 days then 1 tablet daily #30 and 1 refill  If she agrees to the plan she needs to move an appointment up with me and schedule in about 2 months.  The improvement will be gradual but clearly noticeable by 1 month but improved further in the second month 10/18/2021 follow-up phone call:If she would prefer to resume citalopram instead of retrying sertraline, that is fine.  She can start one half citalopram daily for 1 week and then resume 1 tablet daily. She has a prescription for Xanax to use in the meantime until she starts getting benefit from citalopram in 3 to 4 weeks.  11/30/2021 appointment with with the following noted: Current psych meds she is taking buspirone 15 mg only at bedtime, alprazolam 0.5 mg twice daily as needed anxiety Took citalopram 3 night and then stopped. Asks about Prozac.   Always got to be doing something.  Don't know how to stop unless night time.  SSRIs have helped with this some in the past. Sometimes a bit more depression than normal like when first awakens AM. No kids or spouse. Asks about different types SSRIS Plan: Start fluoxetine 20 mg daily  04/12/22 appt noted: Took Prozac a few days and her legs would hurt at night.  Then stopped it. Aches happened with citalopram too.   Chronically stressed and eats junk food at night.  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  If pushes on leg muscles now then pain.  Chronic low exercise tolerance and muscular pain.   Plan: She wants to retry duloxetine.  20 mg daily then 40 mg  daily.  06/21/22 appt noted: On duloxetine 40 mg just for a few weeks. Mood good and no sig SE Hasn't tried exercise this winter.   Mood is good.  Still pretty stressed at work bc seasonal.    Big reports to do.  Full retirment age in Nov and plan to work until next April.  Nice new building.   Still on buspirone 15 HS and may try daytime again. Plan: Continue duloxetine 40 mg daily and buspirone 15 mg nightly, and alprazolam 0.5 mg prn  09/27/2022 appointment noted: Continues meds.  But only taking20 mg duloxetine Not sure if duloxetine helped myalgias. Mind is consumed with work and very vusy outside of work.  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  Takes her a long time to make decisions on colors etc.  Don't stop enough to get depressed. She is pleased with the work. Mood is ok generally.  No consistent dep but brief periods.   No change in wt off citalopram.   No SE Same job 20+ years.  Hopes to retire next year to more pleasant job.  Goood work International aid/development worker.  Plan: Continue  buspirone 15 mg nightly, and alprazolam 0.5 mg prn Increase duloxetine from 20 mg to 40 mg daily   03/19/23 appt noted Didn't notice much difference with duloxetine and stopped it gradually about a week ago.  No SE with it.  No FM benefit with duloxetine.   Meds: buspirone 15 mg HS, alprazolam 0.5 mg prn. Might be willing to restart citalopram.   So much to do with work and PT and other chores. Plans to retire probably April but might work longer.  No option for PT. Asks about phentermine.  Also questions about other wt loss meds.   Past Psychiatric Medication Trials:  Wellbutrin anxiety,  sertraline tremor, Paxil sweats, citalopram leg aches, Trintellix ? took, fluoxetine legs hurt,  Effexor 150, duloxetine 40  mirtazapine,  buspirone, Xanax, propranolol, Lyrica, gabapentin,  Adderall,  Took phentermine 1 month and helped  Review of Systems:  Review of Systems  Respiratory:  Negative for  shortness of breath.   Cardiovascular:  Negative for palpitations.  Musculoskeletal:  Positive for arthralgias and myalgias.  Neurological:  Positive for headaches. Negative for dizziness and tremors.  Psychiatric/Behavioral:  Negative for agitation, behavioral problems, confusion, decreased concentration, dysphoric mood, hallucinations, self-injury, sleep disturbance and suicidal ideas. The patient is nervous/anxious. The patient is not hyperactive.     Medications: I have reviewed the patient's current medications.  Current Outpatient Medications  Medication Sig Dispense Refill   albuterol (VENTOLIN HFA) 108 (90 Base) MCG/ACT inhaler Inhale 2 puffs into the lungs every 6 (six) hours as needed for wheezing or shortness of breath. 3 each 3   Ascorbic Acid (VITAMIN C PO) Take 1,000 mg by mouth daily.      CALCIUM PO Take 600 mg by mouth daily. Citracal     Cholecalciferol (VITAMIN D-1000 MAX ST) 25 MCG (1000 UT) tablet Take by mouth.     cyclobenzaprine (FLEXERIL) 10 MG tablet Take 1 tablet (10 mg total) by mouth 3 (three) times daily as needed for muscle spasms. 60 tablet 1   fluticasone (FLONASE) 50 MCG/ACT nasal spray SPRAY 2 SPRAYS INTO EACH NOSTRIL EVERY DAY 48 mL 2   furosemide (LASIX) 20 MG tablet TAKE 1 TO 2 TABLETS EVERY DAY AS NEEDED     Magnesium 400 MG CAPS Take 400 mg by mouth daily.      Multiple Vitamin (MULTIVITAMIN) tablet Take 1 tablet by mouth daily.     zinc gluconate 50 MG tablet Take 50 mg by mouth daily.     ALPRAZolam (XANAX) 0.5 MG tablet Take 1 tablet (0.5 mg total) by mouth 2 (two) times daily as needed for anxiety. 60 tablet 1   busPIRone (BUSPAR) 15 MG tablet Take 1 tablet (15 mg total) by mouth 2 (two) times daily. 180 tablet 2   No current facility-administered medications for this visit.    Medication Side Effects: None  Allergies:  Allergies  Allergen Reactions   [place] Grass Extract Itching   Dust Mite Extract Other (See Comments)   Grass  Pollen(K-O-R-T-Swt Vern)    Mold Extract  [Trichophyton] Itching   Molds &  Smuts    Other Other (See Comments)    Dust mite/ unknown   Short Ragweed Pollen Ext     Past Medical History:  Diagnosis Date   Allergy    rag weed, pollen,dust   Ankle fracture 12/2017   Left    Anxiety    mild; infrequent per patient   Arthritis    Asthma    Bursitis of hip    Diverticulosis    External hemorrhoids    Fibroid    Hx of adenomatous polyp of colon 07/06/2022   Migraine    Ocular   Obesity    Peripheral edema    infrequent per patient   Pre-diabetes    Vitamin D deficiency     Family History  Problem Relation Age of Onset   Stroke Mother        69 death- hemorrhagic   Hypertension Father    Heart disease Father    Diabetes Father    Heart failure Father        64 death   Uterine cancer Sister    Diabetes Sister    Hypertension Sister    Healthy Brother        other than smoker   Colon cancer Neg Hx    Esophageal cancer Neg Hx    Stomach cancer Neg Hx    Rectal cancer Neg Hx    Colon polyps Neg Hx     Social History   Socioeconomic History   Marital status: Single    Spouse name: Not on file   Number of children: Not on file   Years of education: Not on file   Highest education level: Not on file  Occupational History   Occupation: regulatory affairs    Employer: [employer]  Tobacco Use   Smoking status: Never   Smokeless tobacco: Never  Vaping Use   Vaping status: Never Used  Substance and Sexual Activity   Alcohol use: No   Drug use: No   Sexual activity: Not Currently    Birth control/protection: Abstinence, Surgical  Other Topics Concern   Not on file  Social History Narrative   Lives alone with 2 cats. Never Married. No kids.       Works for [employer] for 29 years in 2022.       Hobbies: time with friends, enjoy [employer] (has been there since she was a child- perhaps 6 months), time with family- 2 sisters and a brother      Advanced  directives: full code, has Living will, HCPOA- older sister Elesa Massed (on file after hip replacement)   Social Determinants of Health   Financial Resource Strain: Not on file  Food Insecurity: Not on file  Transportation Needs: Not on file  Physical Activity: Not on file  Stress: Not on file  Social Connections: Not on file  Intimate Partner Violence: Not on file    Past Medical History, Surgical history, Social history, and Family history were reviewed and updated as appropriate.   Please see review of systems for further details on the patient's review from today.   Objective:   Physical Exam:  LMP 10/14/2000   Physical Exam Constitutional:      General: She is not in acute distress.    Appearance: She is well-developed. She is obese.  Musculoskeletal:        General: No deformity.  Neurological:     Mental Status: She is alert and oriented to person, place, and time.  Motor: No tremor.     Coordination: Coordination normal.     Gait: Gait normal.  Psychiatric:        Attention and Perception: Attention and perception normal.        Mood and Affect: Mood is anxious. Mood is not depressed. Affect is not labile, blunt or angry.        Speech: Speech normal.        Behavior: Behavior normal.        Thought Content: Thought content normal. Thought content is not delusional. Thought content does not include homicidal or suicidal ideation. Thought content does not include suicidal plan.        Cognition and Memory: Cognition normal.        Judgment: Judgment normal.     Comments: Insight intact. No auditory or visual hallucinations. No delusions.  Dep managed.      Lab Review:     Component Value Date/Time   NA 144 12/25/2021 1430   K 4.6 12/25/2021 1430   CL 106 12/25/2021 1430   CO2 27 12/25/2021 1430   GLUCOSE 82 12/25/2021 1430   BUN 11 12/25/2021 1430   CREATININE 0.81 12/25/2021 1430   CREATININE 0.97 03/07/2016 1651   CALCIUM 9.5 12/25/2021 1430    PROT 7.2 12/25/2021 1430   ALBUMIN 4.3 12/25/2021 1430   AST 24 12/25/2021 1430   ALT 24 12/25/2021 1430   ALKPHOS 97 12/25/2021 1430   BILITOT 0.5 12/25/2021 1430   GFRNONAA >60 04/13/2020 0302   GFRNONAA 64 03/07/2016 1651   GFRAA 74 03/07/2016 1651       Component Value Date/Time   WBC 7.0 12/25/2021 1430   RBC 4.07 12/25/2021 1430   HGB 12.7 12/25/2021 1430   HGB 13.1 02/26/2020 1325   HCT 38.2 12/25/2021 1430   HCT 40.1 02/26/2020 1325   PLT 290.0 12/25/2021 1430   PLT 310 02/26/2020 1325   MCV 94.0 12/25/2021 1430   MCV 96 02/26/2020 1325   MCH 31.2 04/13/2020 0302   MCHC 33.3 12/25/2021 1430   RDW 13.0 12/25/2021 1430   RDW 12.7 02/26/2020 1325   LYMPHSABS 2.1 12/25/2021 1430   LYMPHSABS 2.1 02/26/2020 1325   MONOABS 0.4 12/25/2021 1430   EOSABS 0.2 12/25/2021 1430   EOSABS 0.2 02/26/2020 1325   BASOSABS 0.1 12/25/2021 1430   BASOSABS 0.0 02/26/2020 1325    No results found for: "POCLITH", "LITHIUM"   No results found for: "PHENYTOIN", "PHENOBARB", "VALPROATE", "CBMZ"   .res Assessment: Plan:    Generalized anxiety disorder - Plan: busPIRone (BUSPAR) 15 MG tablet  Dysthymia - Plan: busPIRone (BUSPAR) 15 MG tablet  Insomnia due to mental condition - Plan: ALPRAZolam (XANAX) 0.5 MG tablet   30 min face to face time with patient was spent on counseling and coordination of care and grief counseling.   Disc in detail retirement and mental health and being healthy in retirement.  Mood is ok.   Some more anxiety with stress causing chest tightness with normal workup.  Disc stress and wt.    Consider Genesight   She will plan to continue the buspirone to 15 mg nightly because she gets dizzy if she splits the dose. Options to tweak the dose.  As can been seen before above she has tried multiple other psych meds.  Disc SE.    Rare alprazolam. We discussed the short-term risks associated with benzodiazepines including sedation and increased fall risk among  others.  Discussed long-term side effect risk  including dependence, potential withdrawal symptoms, and the potential eventual dose-related risk of dementia.  But recent studies from 2020 dispute this association between benzodiazepines and dementia risk. Newer studies in 2020 do not support an association with dementia.  Importance of activity in retirement.  Disc goals.  Work can be hlepful for mood reasons.  Might retire in a year and then do consultation.   Disc use of duloxetine for FM pain. [id number]  Answered questions about GLP-1 agonists for wt loss vs phentermine.  She took latter in the past.   Disc diet and exercise.    No med changes:  Continue  buspirone 15 mg nightly, and alprazolam 0.5 mg prn  FU 6 mos  Meredith Staggers, MD, DFAPA   Please see After Visit Summary for patient specific instructions.  Future Appointments  Date Time Provider Department Center  06/18/2023  3:00 PM Shelva Majestic, MD [facility]    No orders of the defined types were placed in this encounter.     -------------------------------

## 2023-05-15 ENCOUNTER — Other Ambulatory Visit: Payer: Self-pay

## 2023-05-15 ENCOUNTER — Telehealth: Payer: Self-pay | Admitting: Psychiatry

## 2023-05-15 DIAGNOSIS — F5105 Insomnia due to other mental disorder: Secondary | ICD-10-CM

## 2023-05-15 MED ORDER — ALPRAZOLAM 0.5 MG PO TABS: 0.5000 mg | ORAL_TABLET | Freq: Two times a day (BID) | ORAL | 1 refills | Status: DC | PRN

## 2023-05-15 NOTE — Telephone Encounter (Signed)
 Patient called in for refill on Alprazolam  0.5mg . Ph: [phone] Appt 5/22 Pharmacy [facility] [address] [place], [place]

## 2023-05-15 NOTE — Telephone Encounter (Signed)
 Pended xanax  0.5 mg to rqst pharmacy.

## 2023-05-31 ENCOUNTER — Telehealth: Payer: Self-pay | Admitting: Orthopaedic Surgery

## 2023-05-31 NOTE — Telephone Encounter (Signed)
Pt called requesting 2 disc copies of xrays of left hip from date 10/12/21. Pt states she need one for her records and one for [facility]. Pt states she will sign a medical release form for [place] to mail a disc copy to [facility]. Please call pt when ready for pick up. Pt phone number is [phone].

## 2023-06-03 NOTE — Telephone Encounter (Signed)
Left voicemail advising patient CD was ready for pickup at front desk.  

## 2023-06-18 ENCOUNTER — Encounter: Payer: 59 | Admitting: Family Medicine

## 2023-07-08 ENCOUNTER — Other Ambulatory Visit: Payer: Self-pay | Admitting: Family Medicine

## 2023-07-08 DIAGNOSIS — Z0000 Encounter for general adult medical examination without abnormal findings: Secondary | ICD-10-CM

## 2023-07-25 ENCOUNTER — Encounter: Payer: Self-pay | Admitting: Psychiatry

## 2023-07-25 ENCOUNTER — Ambulatory Visit (INDEPENDENT_AMBULATORY_CARE_PROVIDER_SITE_OTHER): Admitting: Psychiatry

## 2023-07-25 DIAGNOSIS — F5105 Insomnia due to other mental disorder: Secondary | ICD-10-CM | POA: Diagnosis not present

## 2023-07-25 DIAGNOSIS — F341 Dysthymic disorder: Secondary | ICD-10-CM | POA: Diagnosis not present

## 2023-07-25 DIAGNOSIS — M797 Fibromyalgia: Secondary | ICD-10-CM | POA: Diagnosis not present

## 2023-07-25 DIAGNOSIS — F411 Generalized anxiety disorder: Secondary | ICD-10-CM | POA: Diagnosis not present

## 2023-07-25 MED ORDER — DULOXETINE HCL 20 MG PO CPEP: ORAL_CAPSULE | ORAL | 0 refills | Status: DC

## 2023-07-25 NOTE — Progress Notes (Signed)
 Bridget Joyce [id number] [birth year] 68 y.o.     Subjective:   Patient ID:  Bridget Joyce is a 68 y.o. (DOB 02-Feb-1956) female.  Chief Complaint:  Chief Complaint  Patient presents with   Follow-up   Anxiety   Depression   Stress    HPI Bridget Joyce presents  today for follow-up of anxiety and mood.  visit December 2020.  She wanted to wean off the citalopram to see if she can lose weight.  08/11/19 appt with the following noted: She called August 03, 2019 wanting to restart an antidepressant.  Her favorite cat had died.  She stated she felt the worst she ever had in her life.  She was having crying spells, depression, and insomnia along with a lot of anxiety with somatic symptoms and prominent guilt feelings.  As we had discussed before an alternative to citalopram was Lexapro she wanted to start the medication. Prescription sent for Lexapro 10 mg every evening. She started Lexapro at 5 mg daily.  Tolerated.  No effect so far.  A lot of guilt feelings over the loss of the cat. Functioning OK at work.  Prefers not to be alone initially but better some. Has supportive friends and sister. Plan: Start Lexapro 10 mg daily.  12/29/19 appt with the following noted: Increased to 10 mg Lexapro and felt better.  Trying to stop it now.  I felt better now re: cat.  Afraid that it can increase her appetite and wants to wean. Back down to 5 mg Lexapro for 2-3 weeks or so and now down to 1/4 tablet for a month..   Patient reports stable mood and denies depressed or irritable moods.  Patient denies any recent difficulty with more than mild anxiety.  Patient denies difficulty with sleep initiation or maintenance. Denies appetite disturbance.  Patient reports that energy and motivation have been good.  Patient denies any difficulty with concentration.  Patient denies any suicidal ideation.  Plan: She wants to wean Lexapro in hopes of losing weight.  Disc risk of relapse. Disc dosing and effect.   She will stop it.  09/29/2020 appointment with the following noted: Did not lose weight off Lexapro. Overall doing well since here.  Feels about same off Lexapro.  Problems with [employer] today was frustrating. Poor customer service.   Anxiety level is manageable generally.  Not markedly depressed. Taking only buspirone 15 mg HS bc dizzy if takes in the am. Wants prn alprazolam for sleep. Patient reports stable mood and denies depressed or irritable moods.  Patient denies any recent difficulty with anxiety.  Patient denies difficulty with sleep initiation or maintenance. Denies appetite disturbance.  Patient reports that energy and motivation have been good.  Patient denies any difficulty with concentration.  Patient denies any suicidal ideation. Plan: She didn't lose weight off Lexapro but does not feel any worse either. She will plan to increase the buspirone to 30 mg nightly because she gets dizzy if she splits the dose.  09/28/21 appt noted: Buspirone makes her a little dizzy in the morning so usually takes it only at night. Some problems with work load bc not keeping the help needed.  It was a shock.  Lost an employee that wasn't replaced.  Made her want to quit.  Doesn't feel she was respected enough.  Disc poss of retirement and what she would do.  Worked FT her whole life.  Full retirement age next year at 67 and 8 mos. Worked there 30  years. Not markedly depressed.  But has been more stressed. Sleep good. Plan: She didn't lose weight off Lexapro but does not feel any worse either. She will plan to continue the buspirone to 30 mg nightly because she gets dizzy if she splits the dose. Options to tweak the dose.  10/15/2021 phone call wanting to restart an SSRI. The response:Please talk to her and ask what symptoms she is having that make her want to go back on an SSRI.  I assume it is anxiety but it just needs to be documented.  She told me at the last recent appointment that she had stopped  Lexapro and did not really feel any different off of it.  Therefore my suggestion is that she retry sertraline.  She had some tremors when she took it before but that is probably because we push the dose a little too high.  We will keep the dose low and it actually has a better antianxiety effect and does Lexapro.  She did not have any other side effects with sertraline when she took it in the past. If she agrees send in prescription for sertraline 50 mg tablets 1/2 tablet daily for 10 days then 1 tablet daily #30 and 1 refill  If she agrees to the plan she needs to move an appointment up with me and schedule in about 2 months.  The improvement will be gradual but clearly noticeable by 1 month but improved further in the second month 10/18/2021 follow-up phone call:If she would prefer to resume citalopram instead of retrying sertraline, that is fine.  She can start one half citalopram daily for 1 week and then resume 1 tablet daily. She has a prescription for Xanax to use in the meantime until she starts getting benefit from citalopram in 3 to 4 weeks.  11/30/2021 appointment with with the following noted: Current psych meds she is taking buspirone 15 mg only at bedtime, alprazolam 0.5 mg twice daily as needed anxiety Took citalopram 3 night and then stopped. Asks about Prozac.   Always got to be doing something.  Don't know how to stop unless night time.  SSRIs have helped with this some in the past. Sometimes a bit more depression than normal like when first awakens AM. No kids or spouse. Asks about different types SSRIS Plan: Start fluoxetine 20 mg daily  04/12/22 appt noted: Took Prozac a few days and her legs would hurt at night.  Then stopped it. Aches happened with citalopram too.   Chronically stressed and eats junk food at night.  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  If pushes on leg muscles now then pain.  Chronic low exercise tolerance and muscular pain.   Plan: She wants to retry duloxetine.  20 mg daily  then 40 mg daily.  06/21/22 appt noted: On duloxetine 40 mg just for a few weeks. Mood good and no sig SE Hasn't tried exercise this winter.   Mood is good.  Still pretty stressed at work bc seasonal.    Big reports to do.  Full retirment age in Nov and plan to work until next April.  Nice new building.   Still on buspirone 15 HS and may try daytime again. Plan: Continue duloxetine 40 mg daily and buspirone 15 mg nightly, and alprazolam 0.5 mg prn  09/27/2022 appointment noted: Continues meds.  But only taking20 mg duloxetine Not sure if duloxetine helped myalgias. Mind is consumed with work and very vusy outside of work.  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  Takes her a long time to make decisions on colors etc.  Don't stop enough to get depressed. She is pleased with the work. Mood is ok generally.  No consistent dep but brief periods.   No change in wt off citalopram.   No SE Same job 20+ years.  Hopes to retire next year to more pleasant job.  Goood work International aid/development worker.  Plan: Continue  buspirone 15 mg nightly, and alprazolam 0.5 mg prn Increase duloxetine from 20 mg to 40 mg daily   03/19/23 appt noted Didn't notice much difference with duloxetine and stopped it gradually about a week ago.  No SE with it.  No FM benefit with duloxetine.   Meds: buspirone 15 mg HS, alprazolam 0.5 mg prn. Might be willing to restart citalopram.   So much to do with work and PT and other chores. Plans to retire probably April but might work longer.  No option for PT. Asks about phentermine.  Also questions about other wt loss meds. Plan: No med changes:  Continue  buspirone 15 mg nightly, and alprazolam 0.5 mg prn  07/25/23 appt noted: Med:  buspirone 15 mg nightly, and alprazolam 0.5 mg prn but not often Thinking of retiring soon.  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  Feels more ready to do it but is ambivalent.  Financially needs to continue working.  Trying to figure out decide best way to do it  financially. Put in her notice to retire 08/18/23.  Always worked all her life.  Feels like she needs to continue working financially. Thinking of taking another AD again.  She read about SSRI's. Thinking about what to do after retirement.  FM started in 90s.  Never got better.   Past Psychiatric Medication Trials:  Wellbutrin anxiety,  sertraline tremor, Paxil sweats, citalopram leg aches, Trintellix ? took, fluoxetine legs hurt,  Effexor 150, duloxetine 40  mirtazapine,  Xanax,  propranolol, Lyrica, gabapentin, buspirone,  Adderall,  Took phentermine 1 month and helped  Review of Systems:  Review of Systems  Respiratory:  Negative for shortness of breath.   Cardiovascular:  Negative for palpitations.  Musculoskeletal:  Positive for arthralgias and myalgias.  Neurological:  Positive for headaches. Negative for dizziness and tremors.  Psychiatric/Behavioral:  Positive for dysphoric mood. Negative for agitation, behavioral problems, confusion, decreased concentration, hallucinations, self-injury, sleep disturbance and suicidal ideas. The patient is nervous/anxious. The patient is not hyperactive.     Medications: I have reviewed the patient's current medications.  Current Outpatient Medications  Medication Sig Dispense Refill   albuterol (VENTOLIN HFA) 108 (90 Base) MCG/ACT inhaler Inhale 2 puffs into the lungs every 6 (six) hours as needed for wheezing or shortness of breath. 3 each 3   ALPRAZolam (XANAX) 0.5 MG tablet Take 1 tablet (0.5 mg total) by mouth 2 (two) times daily as needed for anxiety. 60 tablet 1   Ascorbic Acid (VITAMIN C PO) Take 1,000 mg by mouth daily.      busPIRone (BUSPAR) 15 MG tablet Take 1 tablet (15 mg total) by mouth 2 (two) times daily. 180 tablet 2   CALCIUM PO Take 600 mg by mouth daily. Citracal     Cholecalciferol (VITAMIN D-1000 MAX ST) 25 MCG (1000 UT) tablet Take by mouth.     cyclobenzaprine (FLEXERIL) 10 MG tablet Take 1 tablet (10 mg total) by  mouth 3 (three) times daily as needed for muscle spasms. 60 tablet 1   fluticasone (FLONASE) 50 MCG/ACT nasal  spray SPRAY 2 SPRAYS INTO EACH NOSTRIL EVERY DAY 48 mL 2   furosemide (LASIX) 20 MG tablet TAKE 1 TO 2 TABLETS EVERY DAY AS NEEDED     Magnesium 400 MG CAPS Take 400 mg by mouth daily.      Multiple Vitamin (MULTIVITAMIN) tablet Take 1 tablet by mouth daily.     zinc gluconate 50 MG tablet Take 50 mg by mouth daily.     DULoxetine (CYMBALTA) 20 MG capsule 1 daily for 1 week then 2 daily for 1 week then 3 daily. (Patient not taking: Reported on 07/25/2023) 60 capsule 0   No current facility-administered medications for this visit.    Medication Side Effects: None  Allergies:  Allergies  Allergen Reactions   [place] Grass Extract Itching   Dust Mite Extract Other (See Comments)   Grass Pollen(K-O-R-T-Swt Vern)    Mold Extract  [Trichophyton] Itching   Molds & Smuts    Other Other (See Comments)    Dust mite/ unknown   Short Ragweed Pollen Ext     Past Medical History:  Diagnosis Date   Allergy    rag weed, pollen,dust   Ankle fracture 12/2017   Left    Anxiety    mild; infrequent per patient   Arthritis    Asthma    Bursitis of hip    Diverticulosis    External hemorrhoids    Fibroid    Hx of adenomatous polyp of colon 07/06/2022   Migraine    Ocular   Obesity    Peripheral edema    infrequent per patient   Pre-diabetes    Vitamin D deficiency     Family History  Problem Relation Age of Onset   Stroke Mother        59 death- hemorrhagic   Hypertension Father    Heart disease Father    Diabetes Father    Heart failure Father        55 death   Uterine cancer Sister    Diabetes Sister    Hypertension Sister    Healthy Brother        other than smoker   Colon cancer Neg Hx    Esophageal cancer Neg Hx    Stomach cancer Neg Hx    Rectal cancer Neg Hx    Colon polyps Neg Hx     Social History   Socioeconomic History   Marital status: Single     Spouse name: Not on file   Number of children: Not on file   Years of education: Not on file   Highest education level: Not on file  Occupational History   Occupation: regulatory affairs    Employer: [employer]  Tobacco Use   Smoking status: Never   Smokeless tobacco: Never  Vaping Use   Vaping status: Never Used  Substance and Sexual Activity   Alcohol use: No   Drug use: No   Sexual activity: Not Currently    Birth control/protection: Abstinence, Surgical  Other Topics Concern   Not on file  Social History Narrative   Lives alone with 2 cats. Never Married. No kids.       Works for [employer] for 29 years in 2022.       Hobbies: time with friends, enjoy [employer] (has been there since she was a child- perhaps 6 months), time with family- 2 sisters and a brother      Advanced directives: full code, has Living will, HCPOA- older sister Bridget Joyce (  on file after hip replacement)   Social Drivers of Health   Financial Resource Strain: Not on file  Food Insecurity: Not on file  Transportation Needs: Not on file  Physical Activity: Not on file  Stress: Not on file  Social Connections: Not on file  Intimate Partner Violence: Not on file    Past Medical History, Surgical history, Social history, and Family history were reviewed and updated as appropriate.   Please see review of systems for further details on the patient's review from today.   Objective:   Physical Exam:  LMP 10/14/2000   Physical Exam Constitutional:      General: She is not in acute distress.    Appearance: She is well-developed. She is obese.  Musculoskeletal:        General: No deformity.  Neurological:     Mental Status: She is alert and oriented to person, place, and time.     Motor: No tremor.     Coordination: Coordination normal.     Gait: Gait normal.  Psychiatric:        Attention and Perception: Attention and perception normal.        Mood and Affect: Mood is anxious and depressed.  Affect is not labile or blunt.        Speech: Speech normal.        Behavior: Behavior normal.        Thought Content: Thought content normal. Thought content is not delusional. Thought content does not include homicidal or suicidal ideation. Thought content does not include suicidal plan.        Cognition and Memory: Cognition normal.        Judgment: Judgment normal.     Comments: Insight intact. No auditory or visual hallucinations. No delusions.  Dep mild      Lab Review:     Component Value Date/Time   NA 144 12/25/2021 1430   K 4.6 12/25/2021 1430   CL 106 12/25/2021 1430   CO2 27 12/25/2021 1430   GLUCOSE 82 12/25/2021 1430   BUN 11 12/25/2021 1430   CREATININE 0.81 12/25/2021 1430   CREATININE 0.97 03/07/2016 1651   CALCIUM 9.5 12/25/2021 1430   PROT 7.2 12/25/2021 1430   ALBUMIN 4.3 12/25/2021 1430   AST 24 12/25/2021 1430   ALT 24 12/25/2021 1430   ALKPHOS 97 12/25/2021 1430   BILITOT 0.5 12/25/2021 1430   GFRNONAA >60 04/13/2020 0302   GFRNONAA 64 03/07/2016 1651   GFRAA 74 03/07/2016 1651       Component Value Date/Time   WBC 7.0 12/25/2021 1430   RBC 4.07 12/25/2021 1430   HGB 12.7 12/25/2021 1430   HGB 13.1 02/26/2020 1325   HCT 38.2 12/25/2021 1430   HCT 40.1 02/26/2020 1325   PLT 290.0 12/25/2021 1430   PLT 310 02/26/2020 1325   MCV 94.0 12/25/2021 1430   MCV 96 02/26/2020 1325   MCH 31.2 04/13/2020 0302   MCHC 33.3 12/25/2021 1430   RDW 13.0 12/25/2021 1430   RDW 12.7 02/26/2020 1325   LYMPHSABS 2.1 12/25/2021 1430   LYMPHSABS 2.1 02/26/2020 1325   MONOABS 0.4 12/25/2021 1430   EOSABS 0.2 12/25/2021 1430   EOSABS 0.2 02/26/2020 1325   BASOSABS 0.1 12/25/2021 1430   BASOSABS 0.0 02/26/2020 1325    No results found for: "POCLITH", "LITHIUM"   No results found for: "PHENYTOIN", "PHENOBARB", "VALPROATE", "CBMZ"   .res Assessment: Plan:    Generalized anxiety disorder - Plan: DULoxetine (CYMBALTA) 20  MG capsule  Dysthymia - Plan:  DULoxetine (CYMBALTA) 20 MG capsule  Insomnia due to mental condition  Fibromyalgia syndrome - Plan: DULoxetine (CYMBALTA) 20 MG capsule   30 min face to face time with patient was spent on counseling and coordination of care and grief counseling.   Disc in detail retirement and mental health and being healthy in retirement.  Mood is ok. But worried about what it will be when retired.  Some more anxiety with stress causing chest tightness with normal workup.  Disc stress and wt.    Consider Genesight   She will plan to continue the buspirone to 15 mg nightly because she gets dizzy if she splits the dose. Options to tweak the dose.  As can been seen before above she has tried multiple other psych meds.  Disc SE.    Rare alprazolam. We discussed the short-term risks associated with benzodiazepines including sedation and increased fall risk among others.  Discussed long-term side effect risk including dependence, potential withdrawal symptoms, and the potential eventual dose-related risk of dementia.  But recent studies from 2020 dispute this association between benzodiazepines and dementia risk. Newer studies in 2020 do not support an association with dementia.  Counseling 22 min: CBT with focus on behavior therapy.  Importance of activity in retirement disc at length.  Disc goals.  Work can be hlepful for mood reasons.  Retire 08/28/23.   Answered questions about GLP-1 agonists for wt loss vs phentermine.  She took latter in the past.   Disc diet and exercise.    Retry duloxetine per her request for mood, FM but increase gradually to 60 mg daily; higher than she took before. Continue  buspirone 15 mg nightly, and alprazolam 0.5 mg prn  FU 6 mos  Meredith Staggers, MD, DFAPA   Please see After Visit Summary for patient specific instructions.  Future Appointments  Date Time Provider Department Center  09/19/2023  1:00 PM Cottle, Steva Ready., MD [facility] None  11/05/2023  2:00 PM [facility] 3  [facility]  12/24/2023  3:00 PM Shelva Majestic, MD [facility]    No orders of the defined types were placed in this encounter.     -------------------------------

## 2023-07-29 ENCOUNTER — Ambulatory Visit: Admitting: Psychiatry

## 2023-07-31 ENCOUNTER — Telehealth: Payer: Self-pay | Admitting: Psychiatry

## 2023-07-31 NOTE — Telephone Encounter (Signed)
 Pt called at 9:34a stating that she called last Friday and left a message and had not heard back.  I don't see a message documented.  Pt is asking for instructions on how to start Cymbalta.  She is also asking if drinking caffeine while on Cymbalta is bad.  She said she read something about serotonin syndrome.  She said she drinks about 3 soda and/or coffees a day.  Pls call her back asap.  She wasn't happy the original message wasn't put in.  Next appt 5/22

## 2023-07-31 NOTE — Telephone Encounter (Signed)
 Patient said the directions for tapering up on the Cymbalta were not on the bottle, maybe an old Rx. Provided the information to taper up, 20 mg for 1 week, 40 mg for 1 week and then 60 mg.

## 2023-07-31 NOTE — Telephone Encounter (Signed)
 LVM to [facility]

## 2023-09-19 ENCOUNTER — Ambulatory Visit: Payer: Medicare Other | Admitting: Psychiatry

## 2023-10-10 ENCOUNTER — Encounter: Payer: Self-pay | Admitting: Psychiatry

## 2023-10-10 ENCOUNTER — Ambulatory Visit: Admitting: Psychiatry

## 2023-10-10 DIAGNOSIS — F341 Dysthymic disorder: Secondary | ICD-10-CM

## 2023-10-10 DIAGNOSIS — M797 Fibromyalgia: Secondary | ICD-10-CM | POA: Diagnosis not present

## 2023-10-10 DIAGNOSIS — F411 Generalized anxiety disorder: Secondary | ICD-10-CM | POA: Diagnosis not present

## 2023-10-10 MED ORDER — DULOXETINE HCL 30 MG PO CPEP: 90.0000 mg | ORAL_CAPSULE | Freq: Every day | ORAL | 1 refills | Status: AC

## 2023-10-10 MED ORDER — BUSPIRONE HCL 15 MG PO TABS: 15.0000 mg | ORAL_TABLET | Freq: Two times a day (BID) | ORAL | 2 refills | Status: AC

## 2023-10-10 NOTE — Progress Notes (Signed)
 Bridget Joyce [id number] 1956/04/05 68 y.o.     Subjective:   Patient ID:  Bridget Joyce is a 68 y.o. (DOB Aug 07, 1955) female.  Chief Complaint:  Chief Complaint  Patient presents with   Follow-up   Depression   Anxiety   Other    HPI Bridget Joyce presents  today for follow-up of anxiety and mood.  visit December 2020.  She wanted to wean off the citalopram  to see if she can lose weight.  08/11/19 appt with the following noted: She called August 03, 2019 wanting to restart an antidepressant.  Her favorite cat had died.  She stated she felt the worst she ever had in her life.  She was having crying spells, depression, and insomnia along with a lot of anxiety with somatic symptoms and prominent guilt feelings.  As we had discussed before an alternative to citalopram  was Lexapro  she wanted to start the medication. Prescription sent for Lexapro  10 mg every evening. She started Lexapro  at 5 mg daily.  Tolerated.  No effect so far.  A lot of guilt feelings over the loss of the cat. Functioning OK at work.  Prefers not to be alone initially but better some. Has supportive friends and sister. Plan: Start Lexapro  10 mg daily.  12/29/19 appt with the following noted: Increased to 10 mg Lexapro  and felt better.  Trying to stop it now.  I felt better now re: cat.  Afraid that it can increase her appetite and wants to wean. Back down to 5 mg Lexapro  for 2-3 weeks or so and now down to 1/4 tablet for a month..   Patient reports stable mood and denies depressed or irritable moods.  Patient denies any recent difficulty with more than mild anxiety.  Patient denies difficulty with sleep initiation or maintenance. Denies appetite disturbance.  Patient reports that energy and motivation have been good.  Patient denies any difficulty with concentration.  Patient denies any suicidal ideation.  Plan: She wants to wean Lexapro  in hopes of losing weight.  Disc risk of relapse. Disc dosing and effect.   She will stop it.  09/29/2020 appointment with the following noted: Did not lose weight off Lexapro . Overall doing well since here.  Feels about same off Lexapro .  Problems with [employer] today was frustrating. Poor customer service.   Anxiety level is manageable generally.  Not markedly depressed. Taking only buspirone  15 mg HS bc dizzy if takes in the am. Wants prn alprazolam  for sleep. Patient reports stable mood and denies depressed or irritable moods.  Patient denies any recent difficulty with anxiety.  Patient denies difficulty with sleep initiation or maintenance. Denies appetite disturbance.  Patient reports that energy and motivation have been good.  Patient denies any difficulty with concentration.  Patient denies any suicidal ideation. Plan: She didn't lose weight off Lexapro  but does not feel any worse either. She will plan to increase the buspirone  to 30 mg nightly because she gets dizzy if she splits the dose.  09/28/21 appt noted: Buspirone  makes her a little dizzy in the morning so usually takes it only at night. Some problems with work load bc not keeping the help needed.  It was a shock.  Lost an employee that wasn't replaced.  Made her want to quit.  Doesn't feel she was respected enough.  Disc poss of retirement and what she would do.  Worked FT her whole life.  Full retirement age next year at 68 and 8 mos. Worked there 30  years. Not markedly depressed.  But has been more stressed. Sleep good. Plan: She didn't lose weight off Lexapro  but does not feel any worse either. She will plan to continue the buspirone  to 30 mg nightly because she gets dizzy if she splits the dose. Options to tweak the dose.  10/15/2021 phone call wanting to restart an SSRI. The response:Please talk to her and ask what symptoms she is having that make her want to go back on an SSRI.  I assume it is anxiety but it just needs to be documented.  She told me at the last recent appointment that she had stopped  Lexapro  and did not really feel any different off of it.  Therefore my suggestion is that she retry sertraline.  She had some tremors when she took it before but that is probably because we push the dose a little too high.  We will keep the dose low and it actually has a better antianxiety effect and does Lexapro .  She did not have any other side effects with sertraline when she took it in the past. If she agrees send in prescription for sertraline 50 mg tablets 1/2 tablet daily for 10 days then 1 tablet daily #30 and 1 refill  If she agrees to the plan she needs to move an appointment up with me and schedule in about 2 months.  The improvement will be gradual but clearly noticeable by 1 month but improved further in the second month 10/18/2021 follow-up phone call:If she would prefer to resume citalopram  instead of retrying sertraline, that is fine.  She can start one half citalopram  daily for 1 week and then resume 1 tablet daily. She has a prescription for Xanax  to use in the meantime until she starts getting benefit from citalopram  in 3 to 4 weeks.  11/30/2021 appointment with with the following noted: Current psych meds she is taking buspirone  15 mg only at bedtime, alprazolam  0.5 mg twice daily as needed anxiety Took citalopram  3 night and then stopped. Asks about Prozac .   Always got to be doing something.  Don't know how to stop unless night time.  SSRIs have helped with this some in the past. Sometimes a bit more depression than normal like when first awakens AM. No kids or spouse. Asks about different types SSRIS Plan: Start fluoxetine  20 mg daily  04/12/22 appt noted: Took Prozac  a few days and her legs would hurt at night.  Then stopped it. Aches happened with citalopram  too.   Chronically stressed and eats junk food at night.  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  If pushes on leg muscles now then pain.  Chronic low exercise tolerance and muscular pain.   Plan: She wants to retry duloxetine .  20 mg daily  then 40 mg daily.  06/21/22 appt noted: On duloxetine  40 mg just for a few weeks. Mood good and no sig SE Hasn't tried exercise this winter.   Mood is good.  Still pretty stressed at work bc seasonal.    Big reports to do.  Full retirment age in Nov and plan to work until next April.  Nice new building.   Still on buspirone  15 HS and may try daytime again. Plan: Continue duloxetine  40 mg daily and buspirone  15 mg nightly, and alprazolam  0.5 mg prn  09/27/2022 appointment noted: Continues meds.  But only taking20 mg duloxetine  Not sure if duloxetine  helped myalgias. Mind is consumed with work and very vusy outside of work.  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  Takes her a long time to make decisions on colors etc.  Don't stop enough to get depressed. She is pleased with the work. Mood is ok generally.  No consistent dep but brief periods.   No change in wt off citalopram .   No SE Same job 20+ years.  Hopes to retire next year to more pleasant job.  Goood work International aid/development worker.  Plan: Continue  buspirone  15 mg nightly, and alprazolam  0.5 mg prn Increase duloxetine  from 20 mg to 40 mg daily   03/19/23 appt noted Didn't notice much difference with duloxetine  and stopped it gradually about a week ago.  No SE with it.  No FM benefit with duloxetine .   Meds: buspirone  15 mg HS, alprazolam  0.5 mg prn. Might be willing to restart citalopram .   So much to do with work and PT and other chores. Plans to retire probably April but might work longer.  No option for PT. Asks about phentermine.  Also questions about other wt loss meds. Plan: No med changes:  Continue  buspirone  15 mg nightly, and alprazolam  0.5 mg prn  07/25/23 appt noted: Med:  buspirone  15 mg nightly, and alprazolam  0.5 mg prn but not often Thinking of retiring soon.  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  Feels more ready to do it but is ambivalent.  Financially needs to continue working.  Trying to figure out decide best way to do it  financially. Put in her notice to retire 08/18/23.  Always worked all her life.  Feels like she needs to continue working financially. Thinking of taking another AD again.  She read about SSRI's. Thinking about what to do after retirement.  FM started in 90s.  Never got better. Plan: Retry duloxetine  per her request for mood, FM but increase gradually to 60 mg daily; higher than she took before. Continue  buspirone  15 mg nightly, and alprazolam  0.5 mg prn  10/10/23 appt noted: Med: duloxetine  60 mg daily for awhile, buspirone15 nightly. Feel fine I general but exercise worsens muscle pain.   Not sure if duloxeitne helped bc hasn't done any exercise to speak of.  Willing to give it more time.   Asks about buspirone  and dosing and ways to do it.   Working on retirement issues, pension, Catering manager. Asks about possibly stopping buspirone  when stress goes down.   Past Psychiatric Medication Trials:  Wellbutrin anxiety,  sertraline tremor, Paxil sweats, citalopram  leg aches, fluoxetine  legs hurt,  Trintellix ? took,  Effexor 150, duloxetine  60  mirtazapine,   Xanax ,  propranolol, Lyrica, gabapentin , buspirone ,   Adderall,  Took phentermine 1 month and helped  Review of Systems:  Review of Systems  Respiratory:  Negative for shortness of breath.   Cardiovascular:  Negative for palpitations.  Musculoskeletal:  Positive for arthralgias and myalgias.  Neurological:  Positive for headaches. Negative for dizziness and tremors.  Psychiatric/Behavioral:  Positive for dysphoric mood. Negative for agitation, behavioral problems, confusion, decreased concentration, hallucinations, self-injury, sleep disturbance and suicidal ideas. The patient is nervous/anxious. The patient is not hyperactive.     Medications: I have reviewed the patient's current medications.  Current Outpatient Medications  Medication Sig Dispense Refill   albuterol  (VENTOLIN  HFA) 108 (90 Base) MCG/ACT inhaler Inhale 2 puffs into  the lungs every 6 (six) hours as needed for wheezing or shortness of breath. 3 each 3   ALPRAZolam  (XANAX ) 0.5 MG tablet Take 1 tablet (0.5 mg total) by mouth 2 (two) times daily as  needed for anxiety. 60 tablet 1   Ascorbic Acid  (VITAMIN C PO) Take 1,000 mg by mouth daily.      busPIRone  (BUSPAR ) 15 MG tablet Take 1 tablet (15 mg total) by mouth 2 (two) times daily. 180 tablet 2   CALCIUM PO Take 600 mg by mouth daily. Citracal     Cholecalciferol (VITAMIN D -1000 MAX ST) 25 MCG (1000 UT) tablet Take by mouth.     cyclobenzaprine  (FLEXERIL ) 10 MG tablet Take 1 tablet (10 mg total) by mouth 3 (three) times daily as needed for muscle spasms. 60 tablet 1   DULoxetine  (CYMBALTA ) 30 MG capsule Take 3 capsules (90 mg total) by mouth daily. 270 capsule 1   fluticasone  (FLONASE ) 50 MCG/ACT nasal spray SPRAY 2 SPRAYS INTO EACH NOSTRIL EVERY DAY 48 mL 2   furosemide (LASIX) 20 MG tablet TAKE 1 TO 2 TABLETS EVERY DAY AS NEEDED     Magnesium  400 MG CAPS Take 400 mg by mouth daily.      Multiple Vitamin (MULTIVITAMIN) tablet Take 1 tablet by mouth daily.     zinc  gluconate 50 MG tablet Take 50 mg by mouth daily.     No current facility-administered medications for this visit.    Medication Side Effects: None  Allergies:  Allergies  Allergen Reactions   [place] Grass Extract Itching   Dust Mite Extract Other (See Comments)   Grass Pollen(K-O-R-T-Swt Vern)    Mold Extract  [Trichophyton] Itching   Molds & Smuts    Other Other (See Comments)    Dust mite/ unknown   Short Ragweed Pollen Ext     Past Medical History:  Diagnosis Date   Allergy    rag weed, pollen,dust   Ankle fracture 12/2017   Left    Anxiety    mild; infrequent per patient   Arthritis    Asthma    Bursitis of hip    Diverticulosis    External hemorrhoids    Fibroid    Hx of adenomatous polyp of colon 07/06/2022   Migraine    Ocular   Obesity    Peripheral edema    infrequent per patient   Pre-diabetes    Vitamin D   deficiency     Family History  Problem Relation Age of Onset   Stroke Mother        57 death- hemorrhagic   Hypertension Father    Heart disease Father    Diabetes Father    Heart failure Father        24 death   Uterine cancer Sister    Diabetes Sister    Hypertension Sister    Healthy Brother        other than smoker   Colon cancer Neg Hx    Esophageal cancer Neg Hx    Stomach cancer Neg Hx    Rectal cancer Neg Hx    Colon polyps Neg Hx     Social History   Socioeconomic History   Marital status: Single    Spouse name: Not on file   Number of children: Not on file   Years of education: Not on file   Highest education level: Not on file  Occupational History   Occupation: regulatory affairs    Employer: [employer]  Tobacco Use   Smoking status: Never   Smokeless tobacco: Never  Vaping Use   Vaping status: Never Used  Substance and Sexual Activity   Alcohol use: No   Drug use: No   Sexual  activity: Not Currently    Birth control/protection: Abstinence, Surgical  Other Topics Concern   Not on file  Social History Narrative   Lives alone with 2 cats. Never Married. No kids.       Works for [employer] for 29 years in 2022.       Hobbies: time with friends, enjoy [employer] (has been there since she was a child- perhaps 6 months), time with family- 2 sisters and a brother      Advanced directives: full code, has Living will, HCPOA- older sister Steffan Edison (on file after hip replacement)   Social Drivers of Corporate investment banker Strain: Not on file  Food Insecurity: Not on file  Transportation Needs: Not on file  Physical Activity: Not on file  Stress: Not on file  Social Connections: Not on file  Intimate Partner Violence: Not on file    Past Medical History, Surgical history, Social history, and Family history were reviewed and updated as appropriate.   Please see review of systems for further details on the patient's review from today.    Objective:   Physical Exam:  LMP 10/14/2000   Physical Exam Constitutional:      General: She is not in acute distress.    Appearance: She is well-developed. She is obese.   Musculoskeletal:        General: No deformity.   Neurological:     Mental Status: She is alert and oriented to person, place, and time.     Motor: No tremor.     Coordination: Coordination normal.     Gait: Gait normal.   Psychiatric:        Attention and Perception: Attention and perception normal.        Mood and Affect: Mood is anxious and depressed. Affect is not labile or blunt.        Speech: Speech normal.        Behavior: Behavior normal.        Thought Content: Thought content normal. Thought content is not delusional. Thought content does not include homicidal or suicidal ideation. Thought content does not include suicidal plan.        Cognition and Memory: Cognition normal.        Judgment: Judgment normal.     Comments: Insight intact. No auditory or visual hallucinations. No delusions.  Dep mild and anxiety       Lab Review:     Component Value Date/Time   NA 144 12/25/2021 1430   K 4.6 12/25/2021 1430   CL 106 12/25/2021 1430   CO2 27 12/25/2021 1430   GLUCOSE 82 12/25/2021 1430   BUN 11 12/25/2021 1430   CREATININE 0.81 12/25/2021 1430   CREATININE 0.97 03/07/2016 1651   CALCIUM 9.5 12/25/2021 1430   PROT 7.2 12/25/2021 1430   ALBUMIN 4.3 12/25/2021 1430   AST 24 12/25/2021 1430   ALT 24 12/25/2021 1430   ALKPHOS 97 12/25/2021 1430   BILITOT 0.5 12/25/2021 1430   GFRNONAA >60 04/13/2020 0302   GFRNONAA 64 03/07/2016 1651   GFRAA 74 03/07/2016 1651       Component Value Date/Time   WBC 7.0 12/25/2021 1430   RBC 4.07 12/25/2021 1430   HGB 12.7 12/25/2021 1430   HGB 13.1 02/26/2020 1325   HCT 38.2 12/25/2021 1430   HCT 40.1 02/26/2020 1325   PLT 290.0 12/25/2021 1430   PLT 310 02/26/2020 1325   MCV 94.0 12/25/2021 1430   MCV 96  02/26/2020 1325   MCH 31.2  04/13/2020 0302   MCHC 33.3 12/25/2021 1430   RDW 13.0 12/25/2021 1430   RDW 12.7 02/26/2020 1325   LYMPHSABS 2.1 12/25/2021 1430   LYMPHSABS 2.1 02/26/2020 1325   MONOABS 0.4 12/25/2021 1430   EOSABS 0.2 12/25/2021 1430   EOSABS 0.2 02/26/2020 1325   BASOSABS 0.1 12/25/2021 1430   BASOSABS 0.0 02/26/2020 1325    No results found for: POCLITH, LITHIUM   No results found for: PHENYTOIN, PHENOBARB, VALPROATE, CBMZ   .res Assessment: Plan:    Generalized anxiety disorder - Plan: DULoxetine  (CYMBALTA ) 30 MG capsule, busPIRone  (BUSPAR ) 15 MG tablet  Dysthymia - Plan: DULoxetine  (CYMBALTA ) 30 MG capsule, busPIRone  (BUSPAR ) 15 MG tablet  Fibromyalgia syndrome - Plan: DULoxetine  (CYMBALTA ) 30 MG capsule   30 min face to face time with patient was spent on counseling and coordination of care and grief counseling.   Disc in detail retirement and mental health and being healthy in retirement.  Mood is ok. But worried about what it will be when retired.  Some more anxiety with stress causing chest tightness with normal workup.  Disc stress and wt.    Consider Genesight   She will plan to continue the buspirone  to 15 mg nightly because she gets dizzy if she splits the dose. Options to tweak the dose.  As can been seen before above she has tried multiple other psych meds.  Disc SE.    Rare alprazolam . We discussed the short-term risks associated with benzodiazepines including sedation and increased fall risk among others.  Discussed long-term side effect risk including dependence, potential withdrawal symptoms, and the potential eventual dose-related risk of dementia.  But recent studies from 2020 dispute this association between benzodiazepines and dementia risk. Newer studies in 2020 do not support an association with dementia.  Retired 08/28/23.   Answered questions about GLP-1 agonists for wt loss vs phentermine.  She took latter in the past.   Disc diet and exercise.     Continual trial duloxetine  per her request for mood, FM and increase gradually to 90 mg daily; higher than she took before. Continue  buspirone  15 mg nightly, and alprazolam  0.5 mg prn  FU 3-4 mos  Nori Beat, MD, DFAPA   Please see After Visit Summary for patient specific instructions.  Future Appointments  Date Time Provider Department Center  11/05/2023  2:00 PM [facility]  12/24/2023  3:00 PM Almira Jaeger, MD [facility]  01/15/2024  1:00 PM Cottle, Kennedy Peabody., MD [facility] None    No orders of the defined types were placed in this encounter.     -------------------------------

## 2023-10-29 ENCOUNTER — Other Ambulatory Visit: Payer: Self-pay | Admitting: Orthopedic Surgery

## 2023-10-29 DIAGNOSIS — M25562 Pain in left knee: Secondary | ICD-10-CM

## 2023-11-05 ENCOUNTER — Ambulatory Visit

## 2023-11-07 ENCOUNTER — Ambulatory Visit
Admission: RE | Admit: 2023-11-07 | Discharge: 2023-11-07 | Disposition: A | Discharge status: Home / Self Care | Source: Ambulatory Visit | Attending: Family Medicine | Admitting: Family Medicine

## 2023-11-07 DIAGNOSIS — Z0000 Encounter for general adult medical examination without abnormal findings: Secondary | ICD-10-CM

## 2023-11-11 ENCOUNTER — Ambulatory Visit
Admission: RE | Admit: 2023-11-11 | Discharge: 2023-11-11 | Disposition: A | Discharge status: Home / Self Care | Source: Ambulatory Visit | Attending: Orthopedic Surgery | Admitting: Orthopedic Surgery

## 2023-11-11 DIAGNOSIS — M25562 Pain in left knee: Secondary | ICD-10-CM

## 2023-12-24 ENCOUNTER — Encounter: Payer: 59 | Admitting: Family Medicine

## 2024-01-01 ENCOUNTER — Other Ambulatory Visit: Payer: Self-pay | Admitting: Family Medicine

## 2024-01-01 DIAGNOSIS — I959 Hypotension, unspecified: Secondary | ICD-10-CM

## 2024-01-01 DIAGNOSIS — R5382 Chronic fatigue, unspecified: Secondary | ICD-10-CM

## 2024-01-01 NOTE — Progress Notes (Signed)
 Intermittent claudication w/ scattered ASCVD on prior CT. Some DOE. And hypotension. Echo ordered.

## 2024-01-15 ENCOUNTER — Ambulatory Visit: Admitting: Psychiatry

## 2024-01-15 ENCOUNTER — Encounter: Payer: Self-pay | Admitting: Psychiatry

## 2024-01-15 DIAGNOSIS — F341 Dysthymic disorder: Secondary | ICD-10-CM | POA: Diagnosis not present

## 2024-01-15 DIAGNOSIS — M797 Fibromyalgia: Secondary | ICD-10-CM | POA: Diagnosis not present

## 2024-01-15 DIAGNOSIS — F411 Generalized anxiety disorder: Secondary | ICD-10-CM | POA: Diagnosis not present

## 2024-01-15 DIAGNOSIS — F5105 Insomnia due to other mental disorder: Secondary | ICD-10-CM | POA: Diagnosis not present

## 2024-01-15 NOTE — Progress Notes (Signed)
 Bridget Joyce [id number] Aug 20, 1955 68 y.o.     Subjective:   Patient ID:  Bridget Joyce is a 68 y.o. (DOB Oct 02, 1955) female.  Chief Complaint:  Chief Complaint  Patient presents with   Follow-up   Depression   Anxiety   Other    FM    HPI Bridget Joyce presents  today for follow-up of anxiety and mood.  visit December 2020.  She wanted to wean off the citalopram  to see if she can lose weight.  08/11/19 appt with the following noted: She called August 03, 2019 wanting to restart an antidepressant.  Her favorite cat had died.  She stated she felt the worst she ever had in her life.  She was having crying spells, depression, and insomnia along with a lot of anxiety with somatic symptoms and prominent guilt feelings.  As we had discussed before an alternative to citalopram  was Lexapro  she wanted to start the medication. Prescription sent for Lexapro  10 mg every evening. She started Lexapro  at 5 mg daily.  Tolerated.  No effect so far.  A lot of guilt feelings over the loss of the cat. Functioning OK at work.  Prefers not to be alone initially but better some. Has supportive friends and sister. Plan: Start Lexapro  10 mg daily.  12/29/19 appt with the following noted: Increased to 10 mg Lexapro  and felt better.  Trying to stop it now.  I felt better now re: cat.  Afraid that it can increase her appetite and wants to wean. Back down to 5 mg Lexapro  for 2-3 weeks or so and now down to 1/4 tablet for a month..   Patient reports stable mood and denies depressed or irritable moods.  Patient denies any recent difficulty with more than mild anxiety.  Patient denies difficulty with sleep initiation or maintenance. Denies appetite disturbance.  Patient reports that energy and motivation have been good.  Patient denies any difficulty with concentration.  Patient denies any suicidal ideation.  Plan: She wants to wean Lexapro  in hopes of losing weight.  Disc risk of relapse. Disc dosing and  effect.  She will stop it.  09/29/2020 appointment with the following noted: Did not lose weight off Lexapro . Overall doing well since here.  Feels about same off Lexapro .  Problems with [employer] today was frustrating. Poor customer service.   Anxiety level is manageable generally.  Not markedly depressed. Taking only buspirone  15 mg HS bc dizzy if takes in the am. Wants prn alprazolam  for sleep. Patient reports stable mood and denies depressed or irritable moods.  Patient denies any recent difficulty with anxiety.  Patient denies difficulty with sleep initiation or maintenance. Denies appetite disturbance.  Patient reports that energy and motivation have been good.  Patient denies any difficulty with concentration.  Patient denies any suicidal ideation. Plan: She didn't lose weight off Lexapro  but does not feel any worse either. She will plan to increase the buspirone  to 30 mg nightly because she gets dizzy if she splits the dose.  09/28/21 appt noted: Buspirone  makes her a little dizzy in the morning so usually takes it only at night. Some problems with work load bc not keeping the help needed.  It was a shock.  Lost an employee that wasn't replaced.  Made her want to quit.  Doesn't feel she was respected enough.  Disc poss of retirement and what she would do.  Worked FT her whole life.  Full retirement age next year at 58 and 19  mos. Worked there 30 years. Not markedly depressed.  But has been more stressed. Sleep good. Plan: She didn't lose weight off Lexapro  but does not feel any worse either. She will plan to continue the buspirone  to 30 mg nightly because she gets dizzy if she splits the dose. Options to tweak the dose.  10/15/2021 phone call wanting to restart an SSRI. The response:Please talk to her and ask what symptoms she is having that make her want to go back on an SSRI.  I assume it is anxiety but it just needs to be documented.  She told me at the last recent appointment that she had  stopped Lexapro  and did not really feel any different off of it.  Therefore my suggestion is that she retry sertraline.  She had some tremors when she took it before but that is probably because we push the dose a little too high.  We will keep the dose low and it actually has a better antianxiety effect and does Lexapro .  She did not have any other side effects with sertraline when she took it in the past. If she agrees send in prescription for sertraline 50 mg tablets 1/2 tablet daily for 10 days then 1 tablet daily #30 and 1 refill  If she agrees to the plan she needs to move an appointment up with me and schedule in about 2 months.  The improvement will be gradual but clearly noticeable by 1 month but improved further in the second month 10/18/2021 follow-up phone call:If she would prefer to resume citalopram  instead of retrying sertraline, that is fine.  She can start one half citalopram  daily for 1 week and then resume 1 tablet daily. She has a prescription for Xanax  to use in the meantime until she starts getting benefit from citalopram  in 3 to 4 weeks.  11/30/2021 appointment with with the following noted: Current psych meds she is taking buspirone  15 mg only at bedtime, alprazolam  0.5 mg twice daily as needed anxiety Took citalopram  3 night and then stopped. Asks about Prozac .   Always got to be doing something.  Don't know how to stop unless night time.  SSRIs have helped with this some in the past. Sometimes a bit more depression than normal like when first awakens AM. No kids or spouse. Asks about different types SSRIS Plan: Start fluoxetine  20 mg daily  04/12/22 appt noted: Took Prozac  a few days and her legs would hurt at night.  Then stopped it. Aches happened with citalopram  too.   Chronically stressed and eats junk food at night.  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  If pushes on leg muscles now then pain.  Chronic low exercise tolerance and muscular pain.   Plan: She wants to retry duloxetine .  20 mg  daily then 40 mg daily.  06/21/22 appt noted: On duloxetine  40 mg just for a few weeks. Mood good and no sig SE Hasn't tried exercise this winter.   Mood is good.  Still pretty stressed at work bc seasonal.    Big reports to do.  Full retirment age in Nov and plan to work until next April.  Nice new building.   Still on buspirone  15 HS and may try daytime again. Plan: Continue duloxetine  40 mg daily and buspirone  15 mg nightly, and alprazolam  0.5 mg prn  09/27/2022 appointment noted: Continues meds.  But only taking20 mg duloxetine  Not sure if duloxetine  helped myalgias. Mind is consumed with work and very vusy outside of work.  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  Takes her a long time to make decisions on colors etc.  Don't stop enough to get depressed. She is pleased with the work. Mood is ok generally.  No consistent dep but brief periods.   No change in wt off citalopram .   No SE Same job 20+ years.  Hopes to retire next year to more pleasant job.  Goood work International aid/development worker.  Plan: Continue  buspirone  15 mg nightly, and alprazolam  0.5 mg prn Increase duloxetine  from 20 mg to 40 mg daily   03/19/23 appt noted Didn't notice much difference with duloxetine  and stopped it gradually about a week ago.  No SE with it.  No FM benefit with duloxetine .   Meds: buspirone  15 mg HS, alprazolam  0.5 mg prn. Might be willing to restart citalopram .   So much to do with work and PT and other chores. Plans to retire probably April but might work longer.  No option for PT. Asks about phentermine.  Also questions about other wt loss meds. Plan: No med changes:  Continue  buspirone  15 mg nightly, and alprazolam  0.5 mg prn  07/25/23 appt noted: Med:  buspirone  15 mg nightly, and alprazolam  0.5 mg prn but not often Thinking of retiring soon.  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  Feels more ready to do it but is ambivalent.  Financially needs to continue working.  Trying to figure out decide best way to do  it financially. Put in her notice to retire 08/18/23.  Always worked all her life.  Feels like she needs to continue working financially. Thinking of taking another AD again.  She read about SSRI's. Thinking about what to do after retirement.  FM started in 90s.  Never got better. Plan: Retry duloxetine  per her request for mood, FM but increase gradually to 60 mg daily; higher than she took before. Continue  buspirone  15 mg nightly, and alprazolam  0.5 mg prn  10/10/23 appt noted: Med: duloxetine  60 mg daily for awhile, buspirone15 nightly. Feel fine I general but exercise worsens muscle pain.   Not sure if duloxeitne helped bc hasn't done any exercise to speak of.  Willing to give it more time.   Asks about buspirone  and dosing and ways to do it.   Working on retirement issues, pension, Catering manager. Asks about possibly stopping buspirone  when stress goes down. Plan: Continual trial duloxetine  per her request for mood, FM and increase gradually to 90 mg daily; higher than she took before. Continue  buspirone  15 mg nightly, and alprazolam  0.5 mg prn  01/15/24 appt noted:  Med: duloxetine  30 mg daily for awhile, buspirone  15 nightly. Went up to 90 mg daily for weeks, then reduced to 60 mg daily, now down to 30 mg daily.  No benefit noted at higher dose. SE sweating at higher dose, mildly shakey Not dep.   Still no exercise tolerance and muscle pain with exercise. Sweating better with lower dose. Stayed real busy with retirement. Would like to be less busy.   Anxiety ok with some stress induced issues. Still mm pain and limited physical activity by it.   Past Psychiatric Medication Trials:  Wellbutrin anxiety,  sertraline tremor, Paxil sweats, citalopram  leg aches, fluoxetine  legs hurt,  Trintellix ? took,  Effexor 150, duloxetine  90  mirtazapine,   Xanax ,  propranolol, Lyrica, gabapentin , buspirone ,   Adderall,  Took phentermine 1 month and helped  Review of Systems:  Review of Systems   HENT:  Positive for  congestion.   Respiratory:  Negative for shortness of breath.   Cardiovascular:  Negative for palpitations.  Musculoskeletal:  Positive for arthralgias and myalgias.  Neurological:  Positive for headaches. Negative for dizziness and tremors.  Psychiatric/Behavioral:  Negative for agitation, behavioral problems, confusion, decreased concentration, dysphoric mood, hallucinations, self-injury, sleep disturbance and suicidal ideas. The patient is nervous/anxious. The patient is not hyperactive.     Medications: I have reviewed the patient's current medications.  Current Outpatient Medications  Medication Sig Dispense Refill   albuterol  (VENTOLIN  HFA) 108 (90 Base) MCG/ACT inhaler Inhale 2 puffs into the lungs every 6 (six) hours as needed for wheezing or shortness of breath. 3 each 3   ALPRAZolam  (XANAX ) 0.5 MG tablet Take 1 tablet (0.5 mg total) by mouth 2 (two) times daily as needed for anxiety. 60 tablet 1   Ascorbic Acid  (VITAMIN C PO) Take 1,000 mg by mouth daily.      busPIRone  (BUSPAR ) 15 MG tablet Take 1 tablet (15 mg total) by mouth 2 (two) times daily. 180 tablet 2   CALCIUM PO Take 600 mg by mouth daily. Citracal     Cholecalciferol (VITAMIN D -1000 MAX ST) 25 MCG (1000 UT) tablet Take by mouth.     cyclobenzaprine  (FLEXERIL ) 10 MG tablet Take 1 tablet (10 mg total) by mouth 3 (three) times daily as needed for muscle spasms. 60 tablet 1   DULoxetine  (CYMBALTA ) 30 MG capsule Take 3 capsules (90 mg total) by mouth daily. (Patient taking differently: Take 30 mg by mouth daily.) 270 capsule 1   fluticasone  (FLONASE ) 50 MCG/ACT nasal spray SPRAY 2 SPRAYS INTO EACH NOSTRIL EVERY DAY 48 mL 2   furosemide (LASIX) 20 MG tablet TAKE 1 TO 2 TABLETS EVERY DAY AS NEEDED     Magnesium  400 MG CAPS Take 400 mg by mouth daily.      Multiple Vitamin (MULTIVITAMIN) tablet Take 1 tablet by mouth daily.     zinc  gluconate 50 MG tablet Take 50 mg by mouth daily.     No current  facility-administered medications for this visit.    Medication Side Effects: None  Allergies:  Allergies  Allergen Reactions   [place] Grass Extract Itching   Dust Mite Extract Other (See Comments)   Grass Pollen(K-O-R-T-Swt Vern)    Mold Extract  [Trichophyton] Itching   Molds & Smuts    Other Other (See Comments)    Dust mite/ unknown   Short Ragweed Pollen Ext     Past Medical History:  Diagnosis Date   Allergy    rag weed, pollen,dust   Ankle fracture 12/2017   Left    Anxiety    mild; infrequent per patient   Arthritis    Asthma    Bursitis of hip    Diverticulosis    External hemorrhoids    Fibroid    Hx of adenomatous polyp of colon 07/06/2022   Migraine    Ocular   Obesity    Peripheral edema    infrequent per patient   Pre-diabetes    Vitamin D  deficiency     Family History  Problem Relation Age of Onset   Stroke Mother        57 death- hemorrhagic   Hypertension Father    Heart disease Father    Diabetes Father    Heart failure Father        77 death   Uterine cancer Sister    Diabetes Sister    Hypertension Sister  Healthy Brother        other than smoker   Colon cancer Neg Hx    Esophageal cancer Neg Hx    Stomach cancer Neg Hx    Rectal cancer Neg Hx    Colon polyps Neg Hx     Social History   Socioeconomic History   Marital status: Single    Spouse name: Not on file   Number of children: Not on file   Years of education: Not on file   Highest education level: Not on file  Occupational History   Occupation: regulatory affairs    Employer: [employer]  Tobacco Use   Smoking status: Never   Smokeless tobacco: Never  Vaping Use   Vaping status: Never Used  Substance and Sexual Activity   Alcohol use: No   Drug use: No   Sexual activity: Not Currently    Birth control/protection: Abstinence, Surgical  Other Topics Concern   Not on file  Social History Narrative   Lives alone with 2 cats. Never Married. No kids.        Works for [employer] for 29 years in 2022.       Hobbies: time with friends, enjoy [employer] (has been there since she was a child- perhaps 6 months), time with family- 2 sisters and a brother      Advanced directives: full code, has Living will, HCPOA- older sister Niels Gunther (on file after hip replacement)   Social Drivers of Corporate investment banker Strain: Not on file  Food Insecurity: Not on file  Transportation Needs: Not on file  Physical Activity: Not on file  Stress: Not on file  Social Connections: Not on file  Intimate Partner Violence: Not on file    Past Medical History, Surgical history, Social history, and Family history were reviewed and updated as appropriate.   Please see review of systems for further details on the patient's review from today.   Objective:   Physical Exam:  LMP 10/14/2000   Physical Exam Constitutional:      General: She is not in acute distress.    Appearance: She is well-developed. She is obese.  Musculoskeletal:        General: No deformity.  Neurological:     Mental Status: She is alert and oriented to person, place, and time.     Motor: No tremor.     Coordination: Coordination normal.     Gait: Gait normal.  Psychiatric:        Attention and Perception: Attention and perception normal.        Mood and Affect: Mood is anxious. Mood is not depressed. Affect is not labile or blunt.        Speech: Speech normal.        Behavior: Behavior normal.        Thought Content: Thought content normal. Thought content is not delusional. Thought content does not include homicidal or suicidal ideation. Thought content does not include suicidal plan.        Cognition and Memory: Cognition normal.        Judgment: Judgment normal.     Comments: Insight intact. No auditory or visual hallucinations. No delusions.  No sig dep.  Anxiety manageable.      Lab Review:     Component Value Date/Time   NA 144 12/25/2021 1430   K 4.6  12/25/2021 1430   CL 106 12/25/2021 1430   CO2 27 12/25/2021 1430   GLUCOSE 82  12/25/2021 1430   BUN 11 12/25/2021 1430   CREATININE 0.81 12/25/2021 1430   CREATININE 0.97 03/07/2016 1651   CALCIUM 9.5 12/25/2021 1430   PROT 7.2 12/25/2021 1430   ALBUMIN 4.3 12/25/2021 1430   AST 24 12/25/2021 1430   ALT 24 12/25/2021 1430   ALKPHOS 97 12/25/2021 1430   BILITOT 0.5 12/25/2021 1430   GFRNONAA >60 04/13/2020 0302   GFRNONAA 64 03/07/2016 1651   GFRAA 74 03/07/2016 1651       Component Value Date/Time   WBC 7.0 12/25/2021 1430   RBC 4.07 12/25/2021 1430   HGB 12.7 12/25/2021 1430   HGB 13.1 02/26/2020 1325   HCT 38.2 12/25/2021 1430   HCT 40.1 02/26/2020 1325   PLT 290.0 12/25/2021 1430   PLT 310 02/26/2020 1325   MCV 94.0 12/25/2021 1430   MCV 96 02/26/2020 1325   MCH 31.2 04/13/2020 0302   MCHC 33.3 12/25/2021 1430   RDW 13.0 12/25/2021 1430   RDW 12.7 02/26/2020 1325   LYMPHSABS 2.1 12/25/2021 1430   LYMPHSABS 2.1 02/26/2020 1325   MONOABS 0.4 12/25/2021 1430   EOSABS 0.2 12/25/2021 1430   EOSABS 0.2 02/26/2020 1325   BASOSABS 0.1 12/25/2021 1430   BASOSABS 0.0 02/26/2020 1325    No results found for: POCLITH, LITHIUM   No results found for: PHENYTOIN, PHENOBARB, VALPROATE, CBMZ   .res Assessment: Plan:    Generalized anxiety disorder  Dysthymia  Fibromyalgia syndrome  Insomnia due to mental condition   30 min face to face time with patient . Disc in detail retirement and mental health and being healthy in retirement.  Mood is ok. But worried about what it will be when retired.  Some more anxiety with stress causing chest tightness with normal workup.  Disc stress and wt.    Consider Genesight   She will plan to continue the buspirone  to 15 mg nightly because she gets dizzy if she splits the dose. Options to tweak the dose.  As can been seen before above she has tried multiple other psych meds.  Disc SE.    Rare alprazolam . We  discussed the short-term risks associated with benzodiazepines including sedation and increased fall risk among others.  Discussed long-term side effect risk including dependence, potential withdrawal symptoms, and the potential eventual dose-related risk of dementia.  But recent studies from 2020 dispute this association between benzodiazepines and dementia risk. Newer studies in 2020 do not support an association with dementia.  Retired 08/28/23.   Answered questions about GLP-1 agonists for wt loss vs phentermine.  She took latter in the past.   Disc diet and exercise.    Problem solved around HOA issues.  Those things get on my mind and not at peace until they are resolved.   Wean duloxetine  DT NR for MM pain and fatigue.    Continue  buspirone  15 mg nightly, and alprazolam  0.5 mg prn  FU 4-6 mos  Lorene Macintosh, MD, DFAPA   Please see After Visit Summary for patient specific instructions.  Future Appointments  Date Time Provider Department Center  01/21/2024  3:00 PM [facility]  04/09/2024  3:00 PM Katrinka Garnette KIDD, MD [facility]    No orders of the defined types were placed in this encounter.     -------------------------------

## 2024-01-21 ENCOUNTER — Ambulatory Visit (HOSPITAL_COMMUNITY)
Admission: RE | Admit: 2024-01-21 | Discharge: 2024-01-21 | Disposition: A | Discharge status: Home / Self Care | Source: Ambulatory Visit | Attending: Family Medicine | Admitting: Family Medicine

## 2024-01-21 DIAGNOSIS — I959 Hypotension, unspecified: Secondary | ICD-10-CM | POA: Insufficient documentation

## 2024-01-21 DIAGNOSIS — I9589 Other hypotension: Secondary | ICD-10-CM

## 2024-01-21 DIAGNOSIS — R008 Other abnormalities of heart beat: Secondary | ICD-10-CM

## 2024-01-21 DIAGNOSIS — I517 Cardiomegaly: Secondary | ICD-10-CM | POA: Insufficient documentation

## 2024-01-21 DIAGNOSIS — R5382 Chronic fatigue, unspecified: Secondary | ICD-10-CM | POA: Diagnosis present

## 2024-01-21 LAB — ECHOCARDIOGRAM COMPLETE
AR max vel: 2.14 cm2
AV Area VTI: 2.48 cm2
AV Area mean vel: 2.3 cm2
AV Mean grad: 6 mmHg
AV Peak grad: 10.8 mmHg
Ao pk vel: 1.64 m/s
Area-P 1/2: 2.91 cm2
S' Lateral: 2.9 cm

## 2024-01-29 ENCOUNTER — Other Ambulatory Visit: Payer: Self-pay | Admitting: Family Medicine

## 2024-01-29 DIAGNOSIS — Z13820 Encounter for screening for osteoporosis: Secondary | ICD-10-CM

## 2024-01-29 NOTE — Progress Notes (Signed)
 Screening exam for osteoporosis.

## 2024-02-28 ENCOUNTER — Other Ambulatory Visit: Payer: Self-pay | Admitting: Psychiatry

## 2024-02-28 DIAGNOSIS — F5105 Insomnia due to other mental disorder: Secondary | ICD-10-CM

## 2024-03-02 ENCOUNTER — Encounter: Payer: Self-pay | Admitting: Radiology

## 2024-04-08 ENCOUNTER — Other Ambulatory Visit: Payer: Self-pay | Admitting: Family Medicine

## 2024-04-08 DIAGNOSIS — Z13820 Encounter for screening for osteoporosis: Secondary | ICD-10-CM

## 2024-04-08 DIAGNOSIS — Z780 Asymptomatic menopausal state: Secondary | ICD-10-CM

## 2024-04-09 ENCOUNTER — Encounter: Admitting: Family Medicine

## 2024-04-15 ENCOUNTER — Encounter: Payer: Self-pay | Admitting: Family Medicine

## 2024-04-16 ENCOUNTER — Other Ambulatory Visit: Payer: Self-pay | Admitting: Psychiatry

## 2024-04-25 ENCOUNTER — Other Ambulatory Visit: Payer: Self-pay | Admitting: Psychiatry

## 2024-05-04 ENCOUNTER — Other Ambulatory Visit (HOSPITAL_BASED_OUTPATIENT_CLINIC_OR_DEPARTMENT_OTHER): Payer: Self-pay

## 2024-05-05 ENCOUNTER — Other Ambulatory Visit (HOSPITAL_BASED_OUTPATIENT_CLINIC_OR_DEPARTMENT_OTHER): Payer: Self-pay

## 2024-05-05 ENCOUNTER — Other Ambulatory Visit: Payer: Self-pay

## 2024-05-05 MED ORDER — ARNUITY ELLIPTA 100 MCG/ACT IN AEPB
1.0000 | INHALATION_SPRAY | Freq: Every day | RESPIRATORY_TRACT | 3 refills | Status: AC
  Filled 2024-05-05: qty 30, 30d supply, fill #0

## 2024-05-14 ENCOUNTER — Other Ambulatory Visit (HOSPITAL_BASED_OUTPATIENT_CLINIC_OR_DEPARTMENT_OTHER)

## 2024-05-16 ENCOUNTER — Other Ambulatory Visit (HOSPITAL_BASED_OUTPATIENT_CLINIC_OR_DEPARTMENT_OTHER): Payer: Self-pay

## 2024-05-27 ENCOUNTER — Ambulatory Visit

## 2024-05-27 DIAGNOSIS — Z780 Asymptomatic menopausal state: Secondary | ICD-10-CM

## 2024-05-27 DIAGNOSIS — Z13820 Encounter for screening for osteoporosis: Secondary | ICD-10-CM

## 2024-07-15 ENCOUNTER — Ambulatory Visit (INDEPENDENT_AMBULATORY_CARE_PROVIDER_SITE_OTHER): Admitting: Psychiatry
